# Patient Record
Sex: Male | Born: 1945 | Race: White | Hispanic: No | Marital: Married | Smoking: Former smoker
Health system: Southern US, Community
[De-identification: ages and names within clinical notes are randomized; demographics above are authoritative.]

## PROBLEM LIST (undated history)

## (undated) DIAGNOSIS — K5730 Diverticulosis of large intestine without perforation or abscess without bleeding: Secondary | ICD-10-CM

## (undated) DIAGNOSIS — T7840XA Allergy, unspecified, initial encounter: Secondary | ICD-10-CM

## (undated) DIAGNOSIS — K219 Gastro-esophageal reflux disease without esophagitis: Secondary | ICD-10-CM

## (undated) DIAGNOSIS — R972 Elevated prostate specific antigen [PSA]: Secondary | ICD-10-CM

## (undated) DIAGNOSIS — N400 Enlarged prostate without lower urinary tract symptoms: Secondary | ICD-10-CM

## (undated) DIAGNOSIS — E785 Hyperlipidemia, unspecified: Secondary | ICD-10-CM

## (undated) DIAGNOSIS — W57XXXA Bitten or stung by nonvenomous insect and other nonvenomous arthropods, initial encounter: Secondary | ICD-10-CM

## (undated) DIAGNOSIS — E039 Hypothyroidism, unspecified: Secondary | ICD-10-CM

## (undated) DIAGNOSIS — M1990 Unspecified osteoarthritis, unspecified site: Secondary | ICD-10-CM

## (undated) DIAGNOSIS — I4891 Unspecified atrial fibrillation: Secondary | ICD-10-CM

## (undated) DIAGNOSIS — R339 Retention of urine, unspecified: Secondary | ICD-10-CM

## (undated) DIAGNOSIS — I10 Essential (primary) hypertension: Secondary | ICD-10-CM

## (undated) HISTORY — DX: Essential (primary) hypertension: I10

## (undated) HISTORY — DX: Unspecified atrial fibrillation: I48.91

## (undated) HISTORY — DX: Elevated prostate specific antigen (PSA): R97.20

## (undated) HISTORY — DX: Unspecified osteoarthritis, unspecified site: M19.90

## (undated) HISTORY — PX: VASECTOMY: SHX75

## (undated) HISTORY — DX: Bitten or stung by nonvenomous insect and other nonvenomous arthropods, initial encounter: W57.XXXA

## (undated) HISTORY — DX: Hyperlipidemia, unspecified: E78.5

## (undated) HISTORY — DX: Allergy, unspecified, initial encounter: T78.40XA

---

## 2005-07-23 ENCOUNTER — Ambulatory Visit: Payer: Self-pay | Admitting: Internal Medicine

## 2005-07-31 ENCOUNTER — Ambulatory Visit: Payer: Self-pay | Admitting: Internal Medicine

## 2005-10-01 ENCOUNTER — Ambulatory Visit: Payer: Self-pay | Admitting: Internal Medicine

## 2005-11-07 ENCOUNTER — Ambulatory Visit: Payer: Self-pay | Admitting: Internal Medicine

## 2006-05-10 ENCOUNTER — Ambulatory Visit: Payer: Self-pay | Admitting: Internal Medicine

## 2006-10-08 ENCOUNTER — Ambulatory Visit: Payer: Self-pay | Admitting: Internal Medicine

## 2006-10-08 LAB — CONVERTED CEMR LAB
ALT: 46 units/L — ABNORMAL HIGH (ref 0–40)
AST: 36 units/L (ref 0–37)
Albumin: 3.7 g/dL (ref 3.5–5.2)
Calcium: 8.8 mg/dL (ref 8.4–10.5)
Chloride: 105 meq/L (ref 96–112)
Creatinine, Ser: 1.1 mg/dL (ref 0.4–1.5)
Eosinophils Absolute: 0.2 10*3/uL (ref 0.0–0.6)
Eosinophils Relative: 3.2 % (ref 0.0–5.0)
GFR calc non Af Amer: 72 mL/min
Glucose, Bld: 103 mg/dL — ABNORMAL HIGH (ref 70–99)
HCT: 47.5 % (ref 39.0–52.0)
LDL Cholesterol: 113 mg/dL — ABNORMAL HIGH (ref 0–99)
MCV: 89.5 fL (ref 78.0–100.0)
Neutrophils Relative %: 47.1 % (ref 43.0–77.0)
Platelets: 351 10*3/uL (ref 150–400)
RBC: 5.31 M/uL (ref 4.22–5.81)
RDW: 11.9 % (ref 11.5–14.6)
Sodium: 141 meq/L (ref 135–145)
Total Bilirubin: 0.9 mg/dL (ref 0.3–1.2)
Total CHOL/HDL Ratio: 4.8
Triglycerides: 134 mg/dL (ref 0–149)
VLDL: 27 mg/dL (ref 0–40)
WBC: 6.8 10*3/uL (ref 4.5–10.5)

## 2006-10-15 ENCOUNTER — Ambulatory Visit: Payer: Self-pay | Admitting: Internal Medicine

## 2006-12-19 ENCOUNTER — Ambulatory Visit: Payer: Self-pay | Admitting: Internal Medicine

## 2006-12-19 LAB — CONVERTED CEMR LAB
PSA, Free Pct: 20 — ABNORMAL LOW (ref >25)
PSA, Free: 0.6 ng/mL

## 2006-12-26 ENCOUNTER — Ambulatory Visit: Payer: Self-pay | Admitting: Internal Medicine

## 2007-01-14 ENCOUNTER — Encounter: Payer: Self-pay | Admitting: Internal Medicine

## 2007-01-20 ENCOUNTER — Ambulatory Visit: Payer: Self-pay | Admitting: Gastroenterology

## 2007-01-31 ENCOUNTER — Ambulatory Visit: Payer: Self-pay | Admitting: Gastroenterology

## 2007-01-31 ENCOUNTER — Encounter: Payer: Self-pay | Admitting: Internal Medicine

## 2007-01-31 HISTORY — PX: COLONOSCOPY: SHX174

## 2007-01-31 LAB — HM COLONOSCOPY

## 2007-02-03 DIAGNOSIS — I10 Essential (primary) hypertension: Secondary | ICD-10-CM | POA: Insufficient documentation

## 2007-02-03 DIAGNOSIS — M109 Gout, unspecified: Secondary | ICD-10-CM | POA: Insufficient documentation

## 2007-02-03 DIAGNOSIS — E785 Hyperlipidemia, unspecified: Secondary | ICD-10-CM

## 2007-03-21 ENCOUNTER — Ambulatory Visit: Payer: Self-pay | Admitting: Internal Medicine

## 2007-03-21 DIAGNOSIS — R972 Elevated prostate specific antigen [PSA]: Secondary | ICD-10-CM | POA: Insufficient documentation

## 2007-08-14 HISTORY — PX: OTHER SURGICAL HISTORY: SHX169

## 2007-09-19 ENCOUNTER — Ambulatory Visit: Payer: Self-pay | Admitting: Internal Medicine

## 2007-09-19 LAB — CONVERTED CEMR LAB
Alkaline Phosphatase: 50 units/L (ref 39–117)
BUN: 13 mg/dL (ref 6–23)
Basophils Absolute: 0 10*3/uL (ref 0.0–0.1)
Basophils Relative: 0.2 % (ref 0.0–1.0)
Bilirubin Urine: NEGATIVE
Blood in Urine, dipstick: NEGATIVE
CO2: 28 meq/L (ref 19–32)
Cholesterol: 159 mg/dL (ref 0–200)
Eosinophils Absolute: 0.2 10*3/uL (ref 0.0–0.6)
GFR calc Af Amer: 87 mL/min
GFR calc non Af Amer: 72 mL/min
HDL: 30 mg/dL — ABNORMAL LOW (ref >39.0)
Hemoglobin: 16.6 g/dL (ref 13.0–17.0)
Ketones, urine, test strip: NEGATIVE
Lymphocytes Relative: 32.9 % (ref 12.0–46.0)
MCHC: 34.1 g/dL (ref 30.0–36.0)
MCV: 87.7 fL (ref 78.0–100.0)
Monocytes Absolute: 0.7 10*3/uL (ref 0.2–0.7)
Monocytes Relative: 11.6 % — ABNORMAL HIGH (ref 3.0–11.0)
Neutro Abs: 3.3 10*3/uL (ref 1.4–7.7)
Potassium: 4.3 meq/L (ref 3.5–5.1)
Specific Gravity, Urine: 1.015
TSH: 4.53 microintl units/mL (ref 0.35–5.50)
Total Protein: 6.7 g/dL (ref 6.0–8.3)
Triglycerides: 105 mg/dL (ref 0–149)
pH: 5.5

## 2007-09-24 ENCOUNTER — Ambulatory Visit: Payer: Self-pay | Admitting: Internal Medicine

## 2007-09-24 DIAGNOSIS — K2970 Gastritis, unspecified, without bleeding: Secondary | ICD-10-CM | POA: Insufficient documentation

## 2007-09-24 DIAGNOSIS — K2990 Gastroduodenitis, unspecified, without bleeding: Secondary | ICD-10-CM

## 2007-11-28 ENCOUNTER — Ambulatory Visit: Payer: Self-pay | Admitting: Internal Medicine

## 2008-04-16 ENCOUNTER — Ambulatory Visit: Payer: Self-pay | Admitting: Internal Medicine

## 2008-04-16 LAB — CONVERTED CEMR LAB
ALT: 42 units/L (ref 0–53)
AST: 31 units/L (ref 0–37)
Bilirubin, Direct: 0.1 mg/dL (ref 0.0–0.3)
Cholesterol: 159 mg/dL (ref 0–200)
HDL: 23.9 mg/dL — ABNORMAL LOW (ref >39.0)
Total Bilirubin: 1 mg/dL (ref 0.3–1.2)

## 2008-05-03 ENCOUNTER — Ambulatory Visit: Payer: Self-pay | Admitting: Internal Medicine

## 2008-08-13 HISTORY — PX: OTHER SURGICAL HISTORY: SHX169

## 2008-10-01 ENCOUNTER — Ambulatory Visit: Payer: Self-pay | Admitting: Internal Medicine

## 2008-10-01 LAB — CONVERTED CEMR LAB
Albumin: 4 g/dL (ref 3.5–5.2)
Alkaline Phosphatase: 56 units/L (ref 39–117)
BUN: 16 mg/dL (ref 6–23)
Bilirubin Urine: NEGATIVE
Blood in Urine, dipstick: NEGATIVE
Calcium: 9.3 mg/dL (ref 8.4–10.5)
Eosinophils Relative: 3 % (ref 0.0–5.0)
GFR calc Af Amer: 87 mL/min
Glucose, Bld: 96 mg/dL (ref 70–99)
Glucose, Urine, Semiquant: NEGATIVE
HCT: 48.7 % (ref 39.0–52.0)
Hemoglobin: 16.6 g/dL (ref 13.0–17.0)
Monocytes Absolute: 0.7 10*3/uL (ref 0.1–1.0)
Monocytes Relative: 11.1 % (ref 3.0–12.0)
Neutro Abs: 3 10*3/uL (ref 1.4–7.7)
Nitrite: NEGATIVE
Potassium: 4.1 meq/L (ref 3.5–5.1)
RBC: 5.35 M/uL (ref 4.22–5.81)
Specific Gravity, Urine: 1.025
Total CHOL/HDL Ratio: 5.4
Total Protein: 7.1 g/dL (ref 6.0–8.3)
Urobilinogen, UA: 0.2
WBC Urine, dipstick: NEGATIVE
WBC: 6.1 10*3/uL (ref 4.5–10.5)
pH: 6

## 2008-10-25 ENCOUNTER — Ambulatory Visit: Payer: Self-pay | Admitting: Internal Medicine

## 2008-10-25 DIAGNOSIS — E039 Hypothyroidism, unspecified: Secondary | ICD-10-CM

## 2008-11-19 ENCOUNTER — Telehealth (INDEPENDENT_AMBULATORY_CARE_PROVIDER_SITE_OTHER): Payer: Self-pay | Admitting: *Deleted

## 2008-12-17 ENCOUNTER — Ambulatory Visit: Payer: Self-pay | Admitting: Internal Medicine

## 2008-12-17 LAB — CONVERTED CEMR LAB
Cholesterol: 150 mg/dL (ref 0–200)
LDL Cholesterol: 95 mg/dL (ref 0–99)
TSH: 1.61 microintl units/mL (ref 0.35–5.50)

## 2008-12-24 ENCOUNTER — Ambulatory Visit: Payer: Self-pay | Admitting: Internal Medicine

## 2009-09-28 ENCOUNTER — Ambulatory Visit: Payer: Self-pay | Admitting: Internal Medicine

## 2009-10-17 ENCOUNTER — Telehealth: Payer: Self-pay | Admitting: Internal Medicine

## 2009-10-26 ENCOUNTER — Ambulatory Visit: Payer: Self-pay | Admitting: Internal Medicine

## 2009-10-26 LAB — CONVERTED CEMR LAB
BUN: 8 mg/dL (ref 6–23)
Bilirubin, Direct: 0 mg/dL (ref 0.0–0.3)
Blood in Urine, dipstick: NEGATIVE
CO2: 31 meq/L (ref 19–32)
Chloride: 107 meq/L (ref 96–112)
Cholesterol: 170 mg/dL (ref 0–200)
Creatinine, Ser: 1.1 mg/dL (ref 0.4–1.5)
Eosinophils Absolute: 0.2 10*3/uL (ref 0.0–0.7)
Glucose, Bld: 92 mg/dL (ref 70–99)
Glucose, Urine, Semiquant: NEGATIVE
Ketones, urine, test strip: NEGATIVE
LDL Cholesterol: 89 mg/dL (ref 0–99)
Lymphs Abs: 2.5 10*3/uL (ref 0.7–4.0)
MCHC: 33.5 g/dL (ref 30.0–36.0)
MCV: 90.7 fL (ref 78.0–100.0)
Monocytes Absolute: 0.7 10*3/uL (ref 0.1–1.0)
Neutrophils Relative %: 51.3 % (ref 43.0–77.0)
PSA: 3.69 ng/mL (ref 0.10–4.00)
Platelets: 341 10*3/uL (ref 150.0–400.0)
RDW: 12.5 % (ref 11.5–14.6)
TSH: 3.43 microintl units/mL (ref 0.35–5.50)
Total Bilirubin: 0.5 mg/dL (ref 0.3–1.2)
Triglycerides: 199 mg/dL — ABNORMAL HIGH (ref 0.0–149.0)
Urobilinogen, UA: 0.2
WBC: 7.1 10*3/uL (ref 4.5–10.5)

## 2010-03-06 ENCOUNTER — Ambulatory Visit: Payer: Self-pay | Admitting: Internal Medicine

## 2010-03-06 DIAGNOSIS — D179 Benign lipomatous neoplasm, unspecified: Secondary | ICD-10-CM | POA: Insufficient documentation

## 2010-03-06 DIAGNOSIS — K7689 Other specified diseases of liver: Secondary | ICD-10-CM

## 2010-03-27 ENCOUNTER — Encounter: Payer: Self-pay | Admitting: Internal Medicine

## 2010-04-13 ENCOUNTER — Ambulatory Visit (HOSPITAL_BASED_OUTPATIENT_CLINIC_OR_DEPARTMENT_OTHER): Admission: RE | Admit: 2010-04-13 | Discharge: 2010-04-13 | Payer: Self-pay | Admitting: General Surgery

## 2010-05-02 ENCOUNTER — Encounter: Payer: Self-pay | Admitting: Internal Medicine

## 2010-06-26 ENCOUNTER — Ambulatory Visit: Payer: Self-pay | Admitting: Internal Medicine

## 2010-06-26 LAB — CONVERTED CEMR LAB
AST: 42 units/L — ABNORMAL HIGH (ref 0–37)
Alkaline Phosphatase: 58 units/L (ref 39–117)
Iron: 106 ug/dL (ref 42–165)
Total Bilirubin: 0.8 mg/dL (ref 0.3–1.2)

## 2010-07-03 ENCOUNTER — Ambulatory Visit: Payer: Self-pay | Admitting: Internal Medicine

## 2010-07-03 LAB — CONVERTED CEMR LAB
HDL goal, serum: 40 mg/dL
LDL Goal: 130 mg/dL

## 2010-09-10 LAB — CONVERTED CEMR LAB: H Pylori IgG: NEGATIVE

## 2010-09-13 NOTE — Letter (Signed)
Summary: [facility]   Imported By: Maryln Gottron 05/10/2010 11:00:03  _____________________________________________________________________  External Attachment:    Type:   Image     Comment:   External Document

## 2010-09-13 NOTE — Letter (Signed)
Summary: [facility]   Imported By: Maryln Gottron 04/06/2010 14:20:01  _____________________________________________________________________  External Attachment:    Type:   Image     Comment:   External Document

## 2010-09-13 NOTE — Progress Notes (Signed)
Summary: Pt req script for Lotrel 10-40mg  caps 90day supply 3 refills  Phone Note Call from Patient Call back at Home Phone [phone]   Caller: spouse-Sylvia Complaint: Urinary/GYN Problems Summary of Call: Pt needs a new script for Lotrel Caps 10-40mg  90 day supply with 3 refills to [facility].    Initial call taken by: Lucy Antigua,  October 17, 2009 11:55 AM    Prescriptions: LOTREL 10-40 MG  CAPS (AMLODIPINE BESY-BENAZEPRIL HCL) once daily  #90 x 3   Entered by:   Willy Eddy, LPN   Authorized by:   Stacie Glaze MD   Signed by:   Willy Eddy, LPN on 09/81/1914   Method used:   Electronically to        [facility]* (mail-order)             ,          Ph: [phone]       Fax: [phone]   RxID:   [id number]   Appended Document: Pt req script for Lotrel 10-40mg  caps 90day supply 3 refills sent

## 2010-09-13 NOTE — Assessment & Plan Note (Signed)
Summary: 3 month rov/njr   Vital Signs:  Patient profile:   65 year old male Height:      71 inches Weight:      215 pounds BMI:     30.09 Temp:     98.2 degrees F oral Pulse rate:   72 / minute Resp:     14 per minute BP sitting:   138 / 80  (left arm)  Vitals Entered By: Willy Eddy, LPN (July 03, 2010 9:02 AM) CC: roa labs, Lipid Management Is Patient Diabetic? No   Primary Care Provider:  Stacie Glaze MD  CC:  roa labs and Lipid Management.  History of Present Illness: Followed for elevations of liver enzymes minimize exposure to ETOH reviewed meds  he is on a statin the iron and copper test were moderate elevations of liver functons  increase rash of legs previously felt to be ring worm?  Lipid Management History:      Positive NCEP/ATP III risk factors include male age 5 years old or older and hypertension.  Negative NCEP/ATP III risk factors include no family history for ischemic heart disease and non-tobacco-user status.     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 Take 1 Once Daily 2)  Cialis 20 Mg Tabs (Tadalafil) .... Take 1 Tablet By Mouth As Directed 3)  Vytorin 10-20 Mg Tabs (Ezetimibe-Simvastatin) .... Take 1/2 Tablet By Mouth Once A Day 4)  Zyrtec Allergy 10 Mg  Tabs (Cetirizine Hcl) .... Once Daily  ( Otc) 5)  Lotrel 10-40 Mg  Caps (Amlodipine Besy-Benazepril Hcl) .... Once  Daily 6)  Prevacid 15 Mg Cpdr (Lansoprazole) .Marland Kitchen.. 1 Once Daily As Needed Indigestion 7)  Levothyroxine Sodium 25 Mcg Tabs (Levothyroxine Sodium) .... One By Mouth Daily 8)  Colchicine 0.6 Mg Tabs (Colchicine) .Marland Kitchen.. 1 Two Times A Day As Needed  Allergies (verified): No Known Drug Allergies  Past History:  Family History: Last updated: 03/21/2007 father diesd from ETOH and had varoceole bleed mother died with dementia  Social History: Last updated: 03/21/2007 Divorced Former Smoker quit greater that 35 years  Risk Factors: Diet: goof diet (03/06/2010) Exercise: yes (03/06/2010)  Risk Factors: Smoking Status: quit (07/03/2010) Passive Smoke Exposure: no (07/03/2010)  Past medical, surgical, family and social histories (including risk factors) reviewed, and no changes noted (except as noted below).  Past Medical History: Reviewed history from 02/03/2007 and no changes required. Hyperlipidemia Hypertension elevated psa Gout  Past Surgical History: Reviewed history from 03/21/2007 and no changes required. Vasectomy  Family History: Reviewed history from 03/21/2007 and no changes required. father diesd from ETOH and had varoceole bleed mother died with dementia  Social History: Reviewed history from 03/21/2007 and no changes required. Divorced Former Smoker quit greater that 35 years  Review of Systems  The patient denies anorexia, fever, weight loss, weight gain, vision loss, decreased hearing, hoarseness, chest pain, syncope, dyspnea on exertion, peripheral  edema, prolonged cough, headaches, hemoptysis, abdominal pain, melena, hematochezia, severe indigestion/heartburn, hematuria, incontinence, genital sores, muscle weakness, suspicious skin lesions, transient blindness, difficulty walking, depression, unusual weight change, abnormal bleeding, enlarged lymph nodes, angioedema, and breast masses.         Marland [email or web address]   Physical Exam  General:  alert and  well-hydrated.   Head:  normocephalic and atraumatic.   Eyes:  pupils equal and pupils round.   Nose:  no external deformity and no nasal discharge.   Neck:  No deformities, masses, or tenderness noted. Lungs:  normal respiratory effort and no wheezes.   Heart:  normal rate and regular rhythm.     Impression & Recommendations:  Problem # 1:  FATTY LIVER DISEASE (ICD-571.8)  Problem # 2:  HYPERLIPIDEMIA (ICD-272.4)  His updated medication list for this problem includes:    Zetia 10 Mg Tabs (Ezetimibe) ..... One by mouth daily  Labs Reviewed: SGOT: 42 (06/26/2010)   SGPT: 71 (06/26/2010)  Lipid Goals: Chol Goal: 200 (07/03/2010)   HDL Goal: 40 (07/03/2010)   LDL Goal: 130 (07/03/2010)   TG Goal: 150 (07/03/2010)  10 Yr Risk Heart Disease: 11 % Prior 10 Yr Risk Heart Disease: 27 % (09/24/2007)   HDL:40.80 (10/26/2009), 32.90 (12/17/2008)  LDL:89 (10/26/2009), 95 (12/17/2008)  Chol:170 (10/26/2009), 150 (12/17/2008)  Trig:199.0 (10/26/2009), 109.0 (12/17/2008)  Problem # 3:  HYPERTENSION (ICD-401.9)  His updated medication list for this problem includes:    Lotrel 10-40 Mg Caps (Amlodipine besy-benazepril hcl) ..... Once daily  BP today: 138/80 Prior BP: 134/76 (03/06/2010)  10 Yr Risk Heart Disease: 11 % Prior 10 Yr Risk Heart Disease: 27 % (09/24/2007)  Labs Reviewed: K+: 3.9 (10/26/2009) Creat: : 1.1 (10/26/2009)   Chol: 170 (10/26/2009)   HDL: 40.80 (10/26/2009)   LDL: 89 (10/26/2009)   TG: 199.0 (10/26/2009)  Problem # 4:  FATTY LIVER DISEASE (ICD-571.8)  Problem # 5:  RINGWORM (ICD-110.9) lotrisome to rash two times a day and if the  Complete Medication List: 1)  Aspirin 325 Mg Tbec (Aspirin) .... Take 1 once daily 2)  Cialis 20 Mg Tabs (Tadalafil) .... Take 1 tablet by mouth as directed 3)  Zetia 10 Mg Tabs (Ezetimibe) .... One by mouth daily 4)  Zyrtec Allergy 10 Mg Tabs (Cetirizine hcl) .... Once daily  ( otc) 5)  Lotrel 10-40 Mg Caps (Amlodipine  besy-benazepril hcl) .... Once daily 6)  Prevacid 15 Mg Cpdr (Lansoprazole) .Marland Kitchen.. 1 once daily as needed indigestion 7)  Levothyroxine Sodium 25 Mcg Tabs (Levothyroxine sodium) .... One by mouth daily 8)  Colchicine 0.6 Mg Tabs (Colchicine) .Marland Kitchen.. 1 two times a day as needed 9)  Clotrimazole-betamethasone 1-0.05 % Crea (Clotrimazole-betamethasone) .... Apply to rash two times a day  Lipid Assessment/Plan:      Based on NCEP/ATP III, the patient's risk factor category is "2 or more risk factors and a calculated 10 year CAD risk of < 20%".  The patient's lipid goals are as follows: Total cholesterol goal is 200; LDL cholesterol goal is 130; HDL cholesterol goal is 40; Triglyceride goal is 150.    Patient Instructions: 1)  Please schedule a follow-up appointment in 4  months. 2)  Hepatic Panel prior to visit, ICD-9: 995.20 3)  Lipid Panel prior to visit, ICD-9:272.4 4)  if the lotions does not work will refer to dermatologist Prescriptions: CLOTRIMAZOLE-BETAMETHASONE 1-0.05 % CREA (CLOTRIMAZOLE-BETAMETHASONE) apply to rash two times a day  #60 cc x 3   Entered and Authorized by:  Stacie Glaze MD   Signed by:   Stacie Glaze MD on 07/03/2010   Method used:   Electronically to        [facility] [phone]* (retail)       [address]       [place], [place]  [address]       Ph: [phone]       Fax: [phone]   RxID:   [id number] ZETIA 10 MG TABS (EZETIMIBE) one by mouth daily  #90 x 3   Entered and Authorized by:   Stacie Glaze MD   Signed by:   Stacie Glaze MD on 07/03/2010   Method used:   Faxed to ...       [facility] (mail-order)             , [place]         Ph: [phone]       Fax: [phone]   RxID:   [id number]    Orders Added: 1)  Est. Patient Level IV [18841]

## 2010-09-13 NOTE — Assessment & Plan Note (Signed)
Summary: cpx-3rd reschedule/bmw   Vital Signs:  Patient profile:   65 year old male Height:      71 inches Weight:      212 pounds BMI:     29.67 Temp:     98.2 degrees F oral Pulse rate:   72 / minute Resp:     14 per minute BP sitting:   134 / 76  (left arm)  Vitals Entered By: Willy Eddy, LPN (March 06, 2010 12:29 PM) CC: annual visit for disease management Is Patient Diabetic? No  Vision Screening:Left eye with correction: 20 / 20 Right eye with correction: 20 / 20 Both eyes with correction: 20 / 20  Color vision testing: normal     40db HL: Left  Right  Audiometry Comment: hears whispered voice at 6 feet    CC:  annual visit for disease management.  History of Present Illness: welcome to medicare exam  Preventive Screening-Counseling & Management  Alcohol-Tobacco     Smoking Status: quit     Year Quit: 1973     Passive Smoke Exposure: no     Tobacco Counseling: to remain off tobacco products  Caffeine-Diet-Exercise     Diet Comments: goof diet     Does Patient Exercise: yes     Type of exercise: walks     Exercise (avg: min/session): 30-60     Times/week: 3     Exercise Counseling: not indicated; exercise is adequate     Depression Counseling: not indicated; screening negative for depression  Safety-Violence-Falls     Seat Belt Use: yes     Smoke Detectors: yes     Violence in the Home: no risk noted     Fall Risk: none     Fall Risk Counseling: not indicated; no significant falls noted  Problems Prior to Update: 1)  Lipoma  (ICD-214.9) 2)  Fatty Liver Disease  (ICD-571.8) 3)  Unspecified Hypothyroidism  (ICD-244.9) 4)  Gastritis  (ICD-535.50) 5)  Physical Examination  (ICD-V70.0) 6)  Prostate Specific Antigen, Elevated  (ICD-790.93) 7)  Gout  (ICD-274.9) 8)  Hypertension  (ICD-401.9) 9)  Hyperlipidemia  (ICD-272.4)  Current Problems (verified): 1)  Unspecified Hypothyroidism  (ICD-244.9) 2)  Gastritis  (ICD-535.50) 3)  Physical  Examination  (ICD-V70.0) 4)  Prostate Specific Antigen, Elevated  (ICD-790.93) 5)  Gout  (ICD-274.9) 6)  Hypertension  (ICD-401.9) 7)  Hyperlipidemia  (ICD-272.4)  Medications Prior to Update: 1)  Aspirin 325 Mg Tbec (Aspirin) .... Take 1 Once Daily 2)  Cialis 20 Mg Tabs (Tadalafil) .... Take 1 Tablet By Mouth As Directed 3)  Rhinocort Aqua 32 Mcg/act Susp (Budesonide (Nasal)) .... As Needed 4)  Vytorin 10-20 Mg Tabs (Ezetimibe-Simvastatin) .... Take 1/2 Tablet By Mouth Once A Day 5)  Zyrtec Allergy 10 Mg  Tabs (Cetirizine Hcl) .... Once Daily  ( Otc) 6)  Lotrel 10-40 Mg  Caps (Amlodipine Besy-Benazepril Hcl) .... Once Daily 7)  Nexium 40 Mg  Cpdr (Esomeprazole Magnesium) .... One By Mouth Daily 8)  Niacin Cr 250 Mg Cr-Tabs (Niacin) .... One By Mouth Daily 9)  Levothyroxine Sodium 25 Mcg Tabs (Levothyroxine Sodium) .... One By Mouth Daily 10)  Indomethacin 25 Mg Caps (Indomethacin) .Marland Kitchen.. 1 Two Times A Day 11)  Colchicine 0.6 Mg Tabs (Colchicine) .Marland Kitchen.. 1 Two Times A Day  Current Medications (verified): 1)  Aspirin 325 Mg Tbec (Aspirin) .... Take 1 Once Daily 2)  Cialis 20 Mg Tabs (Tadalafil) .... Take 1 Tablet By Mouth As Directed  3)  Vytorin 10-20 Mg Tabs (Ezetimibe-Simvastatin) .... Take 1/2 Tablet By Mouth Once A Day 4)  Zyrtec Allergy 10 Mg  Tabs (Cetirizine Hcl) .... Once Daily  ( Otc) 5)  Lotrel 10-40 Mg  Caps (Amlodipine Besy-Benazepril Hcl) .... Once Daily 6)  Prevacid 15 Mg Cpdr (Lansoprazole) .Marland Kitchen.. 1 Once Daily As Needed Indigestion 7)  Levothyroxine Sodium 25 Mcg Tabs (Levothyroxine Sodium) .... One By Mouth Daily 8)  Colchicine 0.6 Mg Tabs (Colchicine) .Marland Kitchen.. 1 Two Times A Day As Needed  Allergies (verified): No Known Drug Allergies  Past History:  Family History: Last updated: 03/21/2007 father diesd from ETOH and had varoceole bleed mother died with dementia  Social History: Last updated: 03/21/2007 Divorced Former Smoker quit greater that 35 years  Risk  Factors: Diet: goof diet (03/06/2010) Exercise: yes (03/06/2010)  Risk Factors: Smoking Status: quit (03/06/2010) Passive Smoke Exposure: no (03/06/2010)  Past medical, surgical, family and social histories (including risk factors) reviewed, and no changes noted (except as noted below).  Past Medical History: Reviewed history from 02/03/2007 and no changes required. Hyperlipidemia Hypertension elevated psa Gout  Past Surgical History: Reviewed history from 03/21/2007 and no changes required. Vasectomy  Family History: Reviewed history from 03/21/2007 and no changes required. father diesd from ETOH and had varoceole bleed mother died with dementia  Social History: Reviewed history from 03/21/2007 and no changes required. 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    Physical Exam  General:  alert and well-hydrated.   Head:  normocephalic and atraumatic.   Eyes:  pupils equal and pupils round.   Ears:  R ear normal and L ear normal.   Nose:  no external deformity and no nasal discharge.   Mouth:  good dentition and pharynx pink and moist.   Neck:  No deformities, masses, or tenderness noted. Chest Wall:  no masses.   Breasts:  no masses and no adenopathy.   Lungs:  normal respiratory effort and no wheezes.   Heart:  normal rate and regular rhythm.   Abdomen:  soft and normal bowel sounds.    Rectal:  normal sphincter tone and external hemorrhoid(s).   Genitalia:  circumcised and no urethral discharge.   Prostate:  no asymmetry and 1+ enlarged.   Msk:  normal ROM and no joint tenderness.   Pulses:  R and L carotid,radial,femoral,dorsalis pedis and posterior tibial pulses are full and equal bilaterally Extremities:  trace left pedal edema and trace right pedal edema.   Neurologic:  alert & oriented X3 and finger-to-nose normal.   Skin:  lipoma on neck Cervical Nodes:  No lymphadenopathy noted Axillary Nodes:  No palpable lymphadenopathy Psych:  Oriented X3, good eye contact, and not anxious appearing.     Impression & Recommendations:  Problem # 1:  PHYSICAL EXAMINATION (ICD-V70.0) The pt was asked about all immunizations, health maint. services that are appropriate to their age and was given guidance on diet exercize  and weight management welcome to medicare exam reviewed referrals and HM issues as well as immunizatins and appropriate screenings pt has  labs ad these were reviewed with him  Colonoscopy: Results: Hemorrhoids.     Results: Diverticulosis.        (01/31/2007) Td Booster: Tdap (09/24/2007)   Flu Vax: Fluvax 3+ (09/28/2009)   Pneumovax: Pneumovax (10/25/2008) Chol: 170 (10/26/2009)   HDL: 40.80 (10/26/2009)   LDL: 89 (10/26/2009)   TG: 199.0 (10/26/2009) TSH: 3.43 (10/26/2009)   PSA: 3.69 (10/26/2009) Next Colonoscopy due:: 03/2012 (03/21/2007)  Discussed using sunscreen, use of alcohol, drug use, self testicular exam, routine dental care, routine eye care, routine physical exam, seat belts, multiple vitamins, osteoporosis prevention, adequate calcium intake in diet, and recommendations for immunizations.  Discussed exercise and checking cholesterol.  Discussed gun safety, safe sex, and contraception. Also recommend checking PSA.  Problem # 2:  PROSTATE SPECIFIC ANTIGEN, ELEVATED (ICD-790.93)  Problem # 3:  UNSPECIFIED HYPOTHYROIDISM (ICD-244.9)  His  updated medication list for this problem includes:    Levothyroxine Sodium 25 Mcg Tabs (Levothyroxine sodium) ..... One by mouth daily  Labs Reviewed: TSH: 3.43 (10/26/2009)    Chol: 170 (10/26/2009)   HDL: 40.80 (10/26/2009)   LDL: 89 (10/26/2009)   TG: 199.0 (10/26/2009)  Problem # 4:  HYPERTENSION (ICD-401.9)  His updated medication list for this problem includes:    Lotrel 10-40 Mg Caps (Amlodipine besy-benazepril hcl) ..... Once daily  BP today: 134/76 Prior BP: 140/74 (12/24/2008)  Prior 10 Yr Risk Heart Disease: 27 % (09/24/2007)  Labs Reviewed: K+: 3.9 (10/26/2009) Creat: : 1.1 (10/26/2009)   Chol: 170 (10/26/2009)   HDL: 40.80 (10/26/2009)   LDL: 89 (10/26/2009)   TG: 199.0 (10/26/2009)  Problem # 5:  FATTY LIVER DISEASE (ICD-571.8) weight los s goal of 190  Problem # 6:  LIPOMA (ICD-214.9)  referral for removal on neck and irritates pt  Orders: Surgical Referral (Surgery)  Complete Medication List: 1)  Aspirin 325 Mg Tbec (Aspirin) .... Take 1 once daily 2)  Cialis 20 Mg Tabs (Tadalafil) .... Take 1 tablet by mouth as directed 3)  Vytorin 10-20 Mg Tabs (Ezetimibe-simvastatin) .... Take 1/2 tablet by mouth once a day 4)  Zyrtec Allergy 10 Mg Tabs (Cetirizine hcl) .... Once daily  ( otc) 5)  Lotrel 10-40 Mg Caps (Amlodipine besy-benazepril hcl) .... Once daily 6)  Prevacid 15 Mg Cpdr (Lansoprazole) .Marland Kitchen.. 1 once daily as needed indigestion 7)  Levothyroxine Sodium 25 Mcg Tabs (Levothyroxine sodium) .... One by mouth daily 8)  Colchicine 0.6 Mg Tabs (Colchicine) .Marland Kitchen.. 1 two times a day as needed  Patient Instructions: 1)  Please schedule a follow-up appointment in 4 months. 2)  Hepatic Panel prior to visit, ICD-9:995.20 3)  iron and copper levels  571.8  Prevention & Chronic Care Immunizations   Influenza vaccine: Fluvax 3+  (09/28/2009)   Influenza vaccine due: 04/13/2010    Tetanus booster: 09/24/2007: Tdap   Tetanus booster due: 09/23/2017    Pneumococcal  vaccine: Pneumovax  (10/25/2008)   Pneumococcal vaccine due: 10/25/2013    H. zoster vaccine: Not documented  Colorectal Screening   Hemoccult: Not documented    Colonoscopy: Results: Hemorrhoids.     Results: Diverticulosis.         (01/31/2007)   Colonoscopy action/deferral: Repeat colonoscopy in 10 years.    (01/31/2007)   Colonoscopy due: 03/2012  Other Screening   PSA: 3.69  (10/26/2009)   Smoking status: quit  (03/06/2010)  Lipids   Total Cholesterol: 170  (10/26/2009)   LDL: 89  (10/26/2009)   LDL Direct: Not documented   HDL: 40.80  (  10/26/2009)   Triglycerides: 199.0  (10/26/2009)   Lipid panel due: 09/06/2010    SGOT (AST): 46  (10/26/2009)   SGPT (ALT): 79  (10/26/2009)   Alkaline phosphatase: 56  (10/26/2009)   Total bilirubin: 0.5  (10/26/2009)   Liver panel due: 09/06/2010    Lipid flowsheet reviewed?: Yes   Progress toward LDL goal: At goal  Hypertension   Last Blood Pressure: 134 / 76  (03/06/2010)   Serum creatinine: 1.1  (10/26/2009)   Serum potassium 3.9  (10/26/2009)    Hypertension flowsheet reviewed?: Yes   Progress toward BP goal: At goal    Stage of readiness to change (hypertension management): Maintenance  Self-Management Support :    Patient will work on the following items until the next clinic visit to reach self-care goals:     Medications and monitoring: take my medicines every day  (03/06/2010)     Eating: eat more vegetables, eat baked foods instead of fried foods, eat fruit for snacks and desserts, limit or avoid alcohol  (03/06/2010)     Activity: take a 30 minute walk every day  (03/06/2010)    Hypertension self-management support: BP self-monitoring log  (03/06/2010)    Lipid self-management support: Education handout  (03/06/2010)     Lipid education handout printed

## 2010-10-23 ENCOUNTER — Other Ambulatory Visit: Payer: Self-pay | Admitting: *Deleted

## 2010-10-23 ENCOUNTER — Telehealth: Payer: Self-pay | Admitting: Internal Medicine

## 2010-10-23 ENCOUNTER — Other Ambulatory Visit (INDEPENDENT_AMBULATORY_CARE_PROVIDER_SITE_OTHER): Payer: Medicare Other | Admitting: Internal Medicine

## 2010-10-23 DIAGNOSIS — E785 Hyperlipidemia, unspecified: Secondary | ICD-10-CM

## 2010-10-23 DIAGNOSIS — T887XXA Unspecified adverse effect of drug or medicament, initial encounter: Secondary | ICD-10-CM

## 2010-10-23 DIAGNOSIS — I10 Essential (primary) hypertension: Secondary | ICD-10-CM

## 2010-10-23 LAB — HEPATIC FUNCTION PANEL
ALT: 65 U/L — ABNORMAL HIGH (ref 0–53)
AST: 36 U/L (ref 0–37)
Bilirubin, Direct: 0.2 mg/dL (ref 0.0–0.3)
Total Bilirubin: 1.1 mg/dL (ref 0.3–1.2)

## 2010-10-23 LAB — LIPID PANEL
Cholesterol: 253 mg/dL — ABNORMAL HIGH (ref 0–200)
Total CHOL/HDL Ratio: 7

## 2010-10-23 MED ORDER — AMLODIPINE BESY-BENAZEPRIL HCL 10-40 MG PO CAPS: 1.0000 | ORAL_CAPSULE | Freq: Every day | ORAL | Status: DC

## 2010-10-23 NOTE — Telephone Encounter (Signed)
Done

## 2010-10-23 NOTE — Telephone Encounter (Signed)
Refill Amlodipine to [facility].

## 2010-10-26 ENCOUNTER — Encounter: Payer: Self-pay | Admitting: Internal Medicine

## 2010-10-27 LAB — CBC
Hemoglobin: 17.5 g/dL — ABNORMAL HIGH (ref 13.0–17.0)
RBC: 5.69 MIL/uL (ref 4.22–5.81)

## 2010-10-27 LAB — DIFFERENTIAL
Basophils Relative: 1 % (ref 0–1)
Lymphs Abs: 2.3 10*3/uL (ref 0.7–4.0)
Monocytes Relative: 15 % — ABNORMAL HIGH (ref 3–12)
Neutro Abs: 3.1 10*3/uL (ref 1.7–7.7)
Neutrophils Relative %: 47 % (ref 43–77)

## 2010-10-27 LAB — BASIC METABOLIC PANEL
Calcium: 8.8 mg/dL (ref 8.4–10.5)
GFR calc Af Amer: >60 mL/min (ref >60)
GFR calc non Af Amer: >60 mL/min (ref >60)
Sodium: 136 mEq/L (ref 135–145)

## 2010-10-30 ENCOUNTER — Ambulatory Visit (INDEPENDENT_AMBULATORY_CARE_PROVIDER_SITE_OTHER): Payer: Medicare Other | Admitting: Internal Medicine

## 2010-10-30 ENCOUNTER — Encounter: Payer: Self-pay | Admitting: Internal Medicine

## 2010-10-30 DIAGNOSIS — M109 Gout, unspecified: Secondary | ICD-10-CM

## 2010-10-30 DIAGNOSIS — K7689 Other specified diseases of liver: Secondary | ICD-10-CM

## 2010-10-30 DIAGNOSIS — E785 Hyperlipidemia, unspecified: Secondary | ICD-10-CM

## 2010-10-30 DIAGNOSIS — I10 Essential (primary) hypertension: Secondary | ICD-10-CM

## 2010-10-30 NOTE — Patient Instructions (Signed)
You'll be taking the Vytorin Monday Wednesday and Friday nights

## 2010-10-30 NOTE — Progress Notes (Signed)
  Subjective:    Patient ID: Stephen Morrison, male    DOB: 1946-05-05, 65 y.o.   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   his liver functions are only slightly elevated from the statin probably DVT more to the fatty infiltration of his liver we discussed resuming a statin on a episodic basis such as Monday Wednesday Friday to minimize the impact of the liver but to help him to achieve his   Review of Systems  Constitutional: Negative for fever and fatigue.  HENT: Negative for hearing loss, congestion, neck pain and postnasal drip.   Eyes: Negative for discharge, redness and visual disturbance.  Respiratory: Negative for cough, shortness of breath and wheezing.   Cardiovascular: Negative for leg swelling.  Gastrointestinal: Negative for abdominal pain, constipation and abdominal distention.  Genitourinary: Negative for urgency and frequency.  Musculoskeletal: Negative for joint swelling and arthralgias.  Skin: Negative for color change and rash.  Neurological: Negative for weakness and light-headedness.  Hematological: Negative for adenopathy.  Psychiatric/Behavioral: Negative for behavioral problems.    lipid lowering goals to do to his other familial and age-related risk factors.    Past Medical History  Diagnosis Date  . Hyperlipidemia   . Hypertension   . PSA elevation   . Gout    Past Surgical History  Procedure Date  . Vasectomy     reports that he quit smoking about 37 years ago. He does not have any smokeless tobacco history on  file. He reports that he drinks alcohol. He reports that he does not use illicit drugs. family history includes Alcohol abuse in his father and Dementia in his father. Allergies not on file  Objective:   Physical Exam  Nursing note and vitals reviewed. Constitutional: He is oriented to person, place, and time. He appears well-developed and well-nourished.  HENT:  Head: Normocephalic and atraumatic.  Eyes: Conjunctivae are normal. Pupils are equal, round, and reactive to light.  Neck: Normal range of motion. Neck supple.  Cardiovascular: Normal rate and regular rhythm.   Pulmonary/Chest: Effort normal and breath sounds normal.  Abdominal: Soft. Bowel sounds are normal.  Musculoskeletal: Normal range of motion.  Neurological: He is alert and oriented to person, place, and time.  Skin: Skin is warm and dry.          Assessment & Plan:   the patient has familial risks for heart disease and his cholesterol is not at goal with current therapy we spent over 30 minutes face-to-face counseling the patient about diet and exercise as well as the impact of statin on his liver and decided together that he should take the statin on a pulse basis Monday Wednesday Friday to minimize the impact of the liver but to help him to achieve his goal for his cholesterol both total HDL and LDL as well as triglycerides.   The patient agreed to this trial samples were given to the patient he will return in 3 month's time with a lipid liver prior to 65

## 2010-12-25 ENCOUNTER — Telehealth: Payer: Self-pay | Admitting: *Deleted

## 2010-12-25 NOTE — Telephone Encounter (Signed)
Pt will come at 12 pm tomorrow.

## 2010-12-25 NOTE — Telephone Encounter (Signed)
Wants to see Dr. Lovell Sheehan today or tomorrow for pulled muscle in his back.

## 2010-12-25 NOTE — Telephone Encounter (Signed)
PLEASE ADVISE.

## 2010-12-25 NOTE — Telephone Encounter (Signed)
Tell him that he may come tomorrow at 12 PM but he'll have to wait If you would like to see my partners we can probably get him in sooner

## 2010-12-26 ENCOUNTER — Ambulatory Visit (INDEPENDENT_AMBULATORY_CARE_PROVIDER_SITE_OTHER): Payer: Medicare Other | Admitting: Internal Medicine

## 2010-12-26 VITALS — BP 150/90 | HR 76 | Temp 98.2°F | Resp 16 | Ht 72.0 in | Wt 216.0 lb

## 2010-12-26 DIAGNOSIS — M533 Sacrococcygeal disorders, not elsewhere classified: Secondary | ICD-10-CM

## 2010-12-26 MED ORDER — PREDNISONE (PAK) 10 MG PO TABS: 10.0000 mg | ORAL_TABLET | ORAL | Status: AC

## 2010-12-26 MED ORDER — DICLOFENAC SODIUM 75 MG PO TBEC: 75.0000 mg | DELAYED_RELEASE_TABLET | Freq: Two times a day (BID) | ORAL | Status: AC

## 2010-12-26 MED ORDER — CHLORZOXAZONE 250 MG PO TABS: 500.0000 mg | ORAL_TABLET | Freq: Two times a day (BID) | ORAL | Status: AC | PRN

## 2010-12-26 MED ORDER — DICLOFENAC SODIUM 75 MG PO TBEC: 75.0000 mg | DELAYED_RELEASE_TABLET | Freq: Two times a day (BID) | ORAL | Status: DC

## 2010-12-26 NOTE — Progress Notes (Signed)
  Subjective:    Patient ID: Stephen Morrison, male    DOB: 1945-12-23, 65 y.o.   MRN: [id number]  HPI He says approximately 15 year history of low back pain has been episodic he was seen at the beginning of diagnosis by [facility] orthopedics he believes an MRI may have been ordered and that it might have shown a ruptured disc.  Since then his flares of pain have been episodic the last flare was evaluated by urgent care on [address] and treated again as a disc rupture.  Pain is lower today. The primary complaint is a catch up on sitting to standing. He has stiffness with rotation but no pain. At the T. flare 2 weeks ago he had more severe pain. His only intervention has been ibuprofen   Review of Systems  Constitutional: Negative for fever and fatigue.  HENT: Negative for hearing loss, congestion, neck pain and postnasal drip.   Eyes: Negative for discharge, redness and visual disturbance.  Respiratory: Negative for cough, shortness of breath and wheezing.   Cardiovascular: Negative for leg swelling.  Gastrointestinal: Negative for abdominal pain, constipation and abdominal distention.  Genitourinary: Negative for urgency and frequency.  Musculoskeletal: Negative for joint swelling and arthralgias.  Skin: Negative for color change and rash.  Neurological: Negative for weakness and light-headedness.  Hematological: Negative for adenopathy.  Psychiatric/Behavioral: Negative for behavioral problems.   Past Medical History  Diagnosis Date  . Hyperlipidemia   . Hypertension   . PSA elevation   . Gout    Past Surgical History  Procedure Date  . Vasectomy     reports that he quit smoking about 37 years ago. He does not have any smokeless tobacco history on file. He reports that he drinks alcohol. He reports that he does not use illicit drugs. family history includes Alcohol abuse in his father and Dementia in his father. Not on File     Objective:   Physical Exam  Constitutional: He  appears well-developed and well-nourished.  HENT:  Head: Normocephalic and atraumatic.  Eyes: Conjunctivae are normal. Pupils are equal, round, and reactive to light.  Neck: Normal range of motion. Neck supple.  Cardiovascular: Normal rate and regular rhythm.   Pulmonary/Chest: Effort normal and breath sounds normal.  Abdominal: Soft. Bowel sounds are normal.  Musculoskeletal:       SI joint pain leftt leg lift  Skin: Skin is warm and dry.          Assessment & Plan:  For the radicular back pain we will use triple therapy diclofenac 75 twice a day Parafon Forte today 500 twice a day and a 6 day prednisone burst and taper

## 2011-01-22 ENCOUNTER — Other Ambulatory Visit: Payer: Self-pay | Admitting: Internal Medicine

## 2011-01-22 ENCOUNTER — Other Ambulatory Visit (INDEPENDENT_AMBULATORY_CARE_PROVIDER_SITE_OTHER): Payer: Medicare Other

## 2011-01-22 DIAGNOSIS — K7689 Other specified diseases of liver: Secondary | ICD-10-CM

## 2011-01-22 DIAGNOSIS — E785 Hyperlipidemia, unspecified: Secondary | ICD-10-CM

## 2011-01-22 LAB — HEPATIC FUNCTION PANEL
ALT: 80 U/L — ABNORMAL HIGH (ref 0–53)
Alkaline Phosphatase: 70 U/L (ref 39–117)
Bilirubin, Direct: 0.1 mg/dL (ref 0.0–0.3)
Total Bilirubin: 0.6 mg/dL (ref 0.3–1.2)
Total Protein: 7.4 g/dL (ref 6.0–8.3)

## 2011-01-22 LAB — LIPID PANEL
Total CHOL/HDL Ratio: 5
Triglycerides: 177 mg/dL — ABNORMAL HIGH (ref 0.0–149.0)

## 2011-01-22 LAB — LDL CHOLESTEROL, DIRECT: Direct LDL: 139.7 mg/dL

## 2011-01-29 ENCOUNTER — Ambulatory Visit (INDEPENDENT_AMBULATORY_CARE_PROVIDER_SITE_OTHER): Payer: Medicare Other | Admitting: Internal Medicine

## 2011-01-29 ENCOUNTER — Encounter: Payer: Self-pay | Admitting: Internal Medicine

## 2011-01-29 VITALS — BP 130/80 | HR 72 | Temp 98.2°F | Resp 16 | Ht 71.0 in | Wt 216.0 lb

## 2011-01-29 DIAGNOSIS — I10 Essential (primary) hypertension: Secondary | ICD-10-CM

## 2011-01-29 DIAGNOSIS — E785 Hyperlipidemia, unspecified: Secondary | ICD-10-CM

## 2011-01-29 DIAGNOSIS — K7689 Other specified diseases of liver: Secondary | ICD-10-CM

## 2011-01-29 MED ORDER — EZETIMIBE-SIMVASTATIN 10-40 MG PO TABS: 1.0000 | ORAL_TABLET | ORAL | Status: DC

## 2011-01-29 NOTE — Progress Notes (Signed)
  Subjective:    Patient ID: Stephen Morrison, male    DOB: Jul 22, 1946, 65 y.o.   MRN: [id number]  HPI    Review of Systems     Objective:   Physical Exam        Assessment & Plan:

## 2011-03-25 ENCOUNTER — Other Ambulatory Visit: Payer: Self-pay | Admitting: Internal Medicine

## 2011-05-31 ENCOUNTER — Encounter: Payer: Medicare Other | Admitting: Internal Medicine

## 2011-06-21 ENCOUNTER — Encounter: Payer: Medicare Other | Admitting: Internal Medicine

## 2011-06-27 ENCOUNTER — Ambulatory Visit (INDEPENDENT_AMBULATORY_CARE_PROVIDER_SITE_OTHER): Payer: Medicare Other | Admitting: Internal Medicine

## 2011-06-27 ENCOUNTER — Encounter: Payer: Self-pay | Admitting: Internal Medicine

## 2011-06-27 DIAGNOSIS — T887XXA Unspecified adverse effect of drug or medicament, initial encounter: Secondary | ICD-10-CM

## 2011-06-27 DIAGNOSIS — K7689 Other specified diseases of liver: Secondary | ICD-10-CM

## 2011-06-27 DIAGNOSIS — E785 Hyperlipidemia, unspecified: Secondary | ICD-10-CM

## 2011-06-27 DIAGNOSIS — Z23 Encounter for immunization: Secondary | ICD-10-CM

## 2011-06-27 DIAGNOSIS — I10 Essential (primary) hypertension: Secondary | ICD-10-CM

## 2011-06-27 DIAGNOSIS — Z0000 Encounter for general adult medical examination without abnormal findings: Secondary | ICD-10-CM

## 2011-06-27 DIAGNOSIS — R972 Elevated prostate specific antigen [PSA]: Secondary | ICD-10-CM

## 2011-06-27 DIAGNOSIS — M109 Gout, unspecified: Secondary | ICD-10-CM

## 2011-06-27 DIAGNOSIS — E039 Hypothyroidism, unspecified: Secondary | ICD-10-CM

## 2011-06-27 LAB — CBC WITH DIFFERENTIAL/PLATELET
Basophils Absolute: 0 10*3/uL (ref 0.0–0.1)
Eosinophils Relative: 2.8 % (ref 0.0–5.0)
HCT: 53.8 % — ABNORMAL HIGH (ref 39.0–52.0)
Hemoglobin: 17.9 g/dL — ABNORMAL HIGH (ref 13.0–17.0)
Lymphocytes Relative: 32.9 % (ref 12.0–46.0)
Lymphs Abs: 2.5 10*3/uL (ref 0.7–4.0)
Monocytes Relative: 11.4 % (ref 3.0–12.0)
Platelets: 400 10*3/uL (ref 150.0–400.0)
RDW: 13.3 % (ref 11.5–14.6)
WBC: 7.5 10*3/uL (ref 4.5–10.5)

## 2011-06-27 LAB — BASIC METABOLIC PANEL
CO2: 30 mEq/L (ref 19–32)
Calcium: 9.4 mg/dL (ref 8.4–10.5)
Chloride: 103 mEq/L (ref 96–112)
Creatinine, Ser: 1.1 mg/dL (ref 0.4–1.5)
Sodium: 142 mEq/L (ref 135–145)

## 2011-06-27 LAB — POCT URINALYSIS DIPSTICK
Glucose, UA: NEGATIVE
Nitrite, UA: NEGATIVE
Protein, UA: NEGATIVE
Urobilinogen, UA: 0.2

## 2011-06-27 LAB — HEPATIC FUNCTION PANEL
ALT: 83 U/L — ABNORMAL HIGH (ref 0–53)
Alkaline Phosphatase: 71 U/L (ref 39–117)
Bilirubin, Direct: 0.1 mg/dL (ref 0.0–0.3)
Total Bilirubin: 0.8 mg/dL (ref 0.3–1.2)
Total Protein: 7.4 g/dL (ref 6.0–8.3)

## 2011-06-27 LAB — LIPID PANEL
HDL: 39.2 mg/dL (ref >39.00)
LDL Cholesterol: 102 mg/dL — ABNORMAL HIGH (ref 0–99)
Total CHOL/HDL Ratio: 4
Triglycerides: 118 mg/dL (ref 0.0–149.0)

## 2011-06-27 LAB — URIC ACID: Uric Acid, Serum: 7.9 mg/dL — ABNORMAL HIGH (ref 4.0–7.8)

## 2011-06-27 LAB — TSH: TSH: 2.44 u[IU]/mL (ref 0.35–5.50)

## 2011-06-27 NOTE — Patient Instructions (Signed)
The patient is instructed to continue all medications as prescribed. Schedule followup with check out clerk upon leaving the clinic  

## 2011-06-27 NOTE — Progress Notes (Signed)
Subjective:    Patient ID: Stephen Morrison, male    DOB: May 18, 1946, 65 y.o.   MRN: [id number]  HPI Patient presented today for a Medicare wellness examination as well as followup for hyperlipidemia gout hypertension and history of GERD In the past she's had abnormal liver enzymes with suspicion of fatty deposition in the liver he is also had a history of a PSA that was elevated Will monitor today his liver enzymes his PSA he is basic metabolic panel uric acid and a lipid panel as well as a TSH for history of hypothyroidism  Review of Systems  Constitutional: Negative for fever and fatigue.  HENT: Negative for hearing loss, congestion, neck pain and postnasal drip.   Eyes: Negative for discharge, redness and visual disturbance.  Respiratory: Negative for cough, shortness of breath and wheezing.   Cardiovascular: Negative for leg swelling.  Gastrointestinal: Negative for abdominal pain, constipation and abdominal distention.  Genitourinary: Negative for urgency and frequency.  Musculoskeletal: Negative for joint swelling and arthralgias.  Skin: Negative for color change and rash.  Neurological: Negative for weakness and light-headedness.  Hematological: Negative for adenopathy.  Psychiatric/Behavioral: Negative for behavioral problems.   Past Medical History  Diagnosis Date  . Hyperlipidemia   . Hypertension   . PSA elevation   . Gout    Past Surgical History  Procedure Date  . Vasectomy     reports that he quit smoking about 37 years ago. He does not have any smokeless tobacco history on file. He reports that he drinks alcohol. He reports that he does not use illicit drugs. family history includes Alcohol abuse in his father and Dementia in his father. Not on File      Objective:   Physical Exam  Nursing note and vitals reviewed. Constitutional: He appears well-developed and well-nourished.  HENT:  Head: Normocephalic and atraumatic.  Eyes: Conjunctivae are normal. Pupils  are equal, round, and reactive to light.  Neck: Normal range of motion. Neck supple.  Cardiovascular: Normal rate and regular rhythm.   Pulmonary/Chest: Effort normal and breath sounds normal.  Abdominal: Soft. Bowel sounds are normal.          Assessment & Plan:  Appropriate screening laboratory work drawn as well as monitoring laboratory work for hypothyroidism a TSH T3-T4 will be drawn a lipid panel and a liver panel will be drawn for his hyperlipidemia a basic metabolic panel will be drawn for hypertension a uric acid will be drawn for the gout ubjective:    Stephen Morrison is a 65 y.o. male who presents for Medicare Annual/Subsequent preventive examination.   Preventive Screening-Counseling & Management  Tobacco History  Smoking status  . Former Smoker  . Quit date: 08/13/1973  Smokeless tobacco  . Not on file    Problems Prior to Visit 1.   Current Problems (verified) Patient Active Problem List  Diagnoses  . LIPOMA  . UNSPECIFIED HYPOTHYROIDISM  . HYPERLIPIDEMIA  . GOUT  . HYPERTENSION  . GASTRITIS  . FATTY LIVER DISEASE  . PROSTATE SPECIFIC ANTIGEN, ELEVATED    Medications Prior to Visit Current Outpatient Prescriptions on File Prior to Visit  Medication Sig Dispense Refill  . amLODipine-benazepril (LOTREL) 10-40 MG per capsule Take 1 capsule by mouth daily.  90 capsule  3  . aspirin 81 MG tablet Take 81 mg by mouth daily.        . cetirizine (ZYRTEC) 10 MG tablet Take 10 mg by mouth daily.        Marland Kitchen  clotrimazole-betamethasone (LOTRISONE) cream Apply topically 2 (two) times daily.        . colchicine 0.6 MG tablet Take 0.6 mg by mouth 2 (two) times daily as needed.        . diclofenac (VOLTAREN) 75 MG EC tablet Take 1 tablet (75 mg total) by mouth 2 (two) times daily with a meal.  60 tablet  2  . lansoprazole (PREVACID) 15 MG capsule Take 15 mg by mouth daily as needed.        Marland Kitchen levothyroxine (SYNTHROID, LEVOTHROID) 25 MCG tablet TAKE 1 TABLET DAILY  90 tablet   2  . tadalafil (CIALIS) 20 MG tablet Take 20 mg by mouth daily as needed.          Current Medications (verified) Current Outpatient Prescriptions  Medication Sig Dispense Refill  . chlorzoxazone (PARAFON) 500 MG tablet Take 500 mg by mouth 2 (two) times daily as needed.        Marland Kitchen amLODipine-benazepril (LOTREL) 10-40 MG per capsule Take 1 capsule by mouth daily.  90 capsule  3  . aspirin 81 MG tablet Take 81 mg by mouth daily.        . cetirizine (ZYRTEC) 10 MG tablet Take 10 mg by mouth daily.        . clotrimazole-betamethasone (LOTRISONE) cream Apply topically 2 (two) times daily.        . colchicine 0.6 MG tablet Take 0.6 mg by mouth 2 (two) times daily as needed.        . diclofenac (VOLTAREN) 75 MG EC tablet Take 1 tablet (75 mg total) by mouth 2 (two) times daily with a meal.  60 tablet  2  . lansoprazole (PREVACID) 15 MG capsule Take 15 mg by mouth daily as needed.        Marland Kitchen levothyroxine (SYNTHROID, LEVOTHROID) 25 MCG tablet TAKE 1 TABLET DAILY  90 tablet  2  . tadalafil (CIALIS) 20 MG tablet Take 20 mg by mouth daily as needed.           Allergies (verified) Review of patient's allergies indicates not on file.   PAST HISTORY  Family History Family History  Problem Relation Age of Onset  . Alcohol abuse Father   . Dementia Father     Social History History  Substance Use Topics  . Smoking status: Former Smoker    Quit date: 08/13/1973  . Smokeless tobacco: Not on file  . Alcohol Use: Yes     OCCASIONALLY    Are there smokers in your home (other than you)?  No  Risk Factors Current exercise habits: Gym/ health club routine includes cardio and treadmill.   Dietary issues discussed: no   Cardiac risk factors: advanced age (older than 31 for men, 15 for women) and male gender.  Depression Screen (Note: if answer to either of the following is "Yes", a more complete depression screening is indicated)   Q1: Over the past two weeks, have you felt down, depressed or  hopeless? No  Q2: Over the past two weeks, have you felt little interest or pleasure in doing things? No  Have you lost interest or pleasure in daily life? No  Do you often feel hopeless? No  Do you cry easily over simple problems? No  Activities of Daily Living In your present state of health, do you have any difficulty performing the following activities?:  Driving? No Managing money?  \no Feeding yourself? No Getting from bed to chair? No Climbing a flight of  stairs? No Preparing food and eating?: no Bathing or showering? No Getting dressed: No Getting to the toilet? No Using the toilet:No Moving around from place to place: No In the past year have you fallen or had a near fall?:No   Are you sexually active?  No  Do you have more than one partner?  No  Hearing Difficulties: No Do you often ask people to speak up or repeat themselves? No Do you experience ringing or noises in your ears? No Do you have difficulty understanding soft or whispered voices? No   Do you feel that you have a problem with memory? No  Do you often misplace items? No  Do you feel safe at home?  No  Cognitive Testing  Alert? yes Normal Appearance?Yes  Oriented to person? yes  Place? yes   Time? Yes  Recall of three objects?  Yes  Can perform simple calculations? No  Displays appropriate judgment?Yes  Can read the correct time from a watch face?Yes   Advanced Directives have been discussed with the patient? Yes   List the Names of Other Physician/Practitioners you currently use: 1.    Indicate any recent Medical Services you may have received from other than [facility] providers in the past year (date may be approximate).  Immunization History  Administered Date(s) Administered  . Influenza Split 06/27/2011  . Influenza Whole 09/24/2007, 05/03/2008, 09/28/2009  . Pneumococcal Polysaccharide 10/25/2008  . Td 09/24/2007    Screening Tests Health Maintenance  Topic Date Due  . Zostavax   05/25/2004  . Pneumococcal Polysaccharide Vaccine Age 28 And Over  05/25/2009  . Influenza Vaccine  05/14/2011  . Colonoscopy  01/30/2017  . Tetanus/tdap  09/23/2017    All answers were reviewed with the patient and necessary referrals were made:  Stephen Morrison, Stephen Morrison   06/27/2011   History reviewed: allergies, current medications, past family history, past medical history, past social history, past surgical history and problem list  Review of Systems A comprehensive review of systems was negative.    Objective:     Vision by Snellen chart: right eye:20/20, left eye:20/20 Blood pressure 140/80, pulse 72, temperature 98.3 F (36.8 C), resp. rate 16, height 5\' 11"  (1.803 m), weight 214 lb (97.07 kg). Body mass index is 29.85 kg/(m^2).  Exam per office note     Assessment:      Patient presents for yearly preventative medicine examination.   all immunizations and health maintenance protocols were reviewed with the patient and they are up to date with these protocols.   screening laboratory values were reviewed with the patient including screening of hyperlipidemia PSA renal function and hepatic function.   There medications past medical history social history problem list and allergies were reviewed in detail.   Goals were established with regard to weight loss exercise diet in compliance with medications      Plan:     During the course of the visit the patient was educated and counseled about appropriate screening and preventive services including:    Pneumococcal vaccine   Influenza vaccine  Prostate cancer screening  Diet review for nutrition referral? Yes ____  Not Indicated ____   Patient Instructions (the written plan) was given to the patient.  Medicare Attestation I have personally reviewed: The patient's medical and social history Their use of alcohol, tobacco or illicit drugs Their current medications and supplements The patient's functional ability  including ADLs,fall risks, home safety risks, cognitive, and hearing and visual impairment Diet and physical activities  Evidence for depression or mood disorders  The patient's weight, height, BMI, and visual acuity have been recorded in the chart.  I have made referrals, counseling, and provided education to the patient based on review of the above and I have provided the patient with a written personalized care plan for preventive services.     OVID, WITMAN   06/27/2011

## 2011-10-23 ENCOUNTER — Other Ambulatory Visit: Payer: Self-pay | Admitting: Internal Medicine

## 2011-12-19 ENCOUNTER — Ambulatory Visit (INDEPENDENT_AMBULATORY_CARE_PROVIDER_SITE_OTHER): Payer: Medicare Other | Admitting: Family

## 2011-12-19 ENCOUNTER — Encounter: Payer: Self-pay | Admitting: Family

## 2011-12-19 VITALS — BP 152/80 | Temp 101.1°F | Wt 218.0 lb

## 2011-12-19 DIAGNOSIS — J069 Acute upper respiratory infection, unspecified: Secondary | ICD-10-CM | POA: Diagnosis not present

## 2011-12-19 DIAGNOSIS — R509 Fever, unspecified: Secondary | ICD-10-CM

## 2011-12-19 DIAGNOSIS — R05 Cough: Secondary | ICD-10-CM

## 2011-12-19 MED ORDER — AMOXICILLIN 500 MG PO TABS: 1000.0000 mg | ORAL_TABLET | Freq: Two times a day (BID) | ORAL | Status: DC

## 2011-12-19 NOTE — Patient Instructions (Signed)

## 2011-12-19 NOTE — Progress Notes (Signed)
Subjective:    Patient ID: Stephen Morrison, male    DOB: [birth year], 66 y.o.   MRN: [id number]  HPI 66 year old white male, nonsmoker, patient of Dr. Lovell Sheehan is in today with complaints of cough, congestion, fever and chills x4 days. Cough is productive with yellow phlegm. His symptoms are worsening. Reports spreading 40 bales of pine needles over the weekend. He believes this is what has caused him to fall he'll. His intake and ibuprofen help control his fever with moderate relief. Denies any lightheadedness, dizziness, chest pain, palpitations, shortness of breath or edema.   Review of Systems  Constitutional: Positive for fever, chills and fatigue.  HENT: Positive for postnasal drip. Negative for sore throat and sneezing.   Respiratory: Positive for cough.   Cardiovascular: Negative.   Gastrointestinal: Negative.   Musculoskeletal: Negative.   Skin: Negative.   Hematological: Negative.   Psychiatric/Behavioral: Negative.    Past Medical History  Diagnosis Date  . Hyperlipidemia   . Hypertension   . PSA elevation   . Gout     History   Social History  . Marital Status: Divorced    Spouse Name: N/A    Number of Children: N/A  . Years of Education: N/A   Occupational History  . Not on file.   Social History Main Topics  . Smoking status: Former Smoker    Quit date: 08/13/1973  . Smokeless tobacco: Not on file  . Alcohol Use: Yes     OCCASIONALLY  . Drug Use: No  . Sexually Active: Yes   Other Topics Concern  . Not on file   Social History Narrative  . No narrative on file    Past Surgical History  Procedure Date  . Vasectomy     Family History  Problem Relation Age of Onset  . Alcohol abuse Father   . Dementia Father     No Known Allergies  Current Outpatient Prescriptions on File Prior to Visit  Medication Sig Dispense Refill  . amLODipine-benazepril (LOTREL) 10-40 MG per capsule TAKE 1 CAPSULE DAILY  90 capsule  2  . aspirin 81 MG tablet Take 81 mg  by mouth daily.        . cetirizine (ZYRTEC) 10 MG tablet Take 10 mg by mouth daily.        . chlorzoxazone (PARAFON) 500 MG tablet Take 500 mg by mouth 2 (two) times daily as needed.        . clotrimazole-betamethasone (LOTRISONE) cream Apply topically 2 (two) times daily.        . colchicine 0.6 MG tablet Take 0.6 mg by mouth 2 (two) times daily as needed.        . diclofenac (VOLTAREN) 75 MG EC tablet Take 1 tablet (75 mg total) by mouth 2 (two) times daily with a meal.  60 tablet  2  . lansoprazole (PREVACID) 15 MG capsule Take 15 mg by mouth daily as needed.        Marland Kitchen levothyroxine (SYNTHROID, LEVOTHROID) 25 MCG tablet TAKE 1 TABLET DAILY  90 tablet  2  . tadalafil (CIALIS) 20 MG tablet Take 20 mg by mouth daily as needed.          BP 152/80  Temp(Src) 101.1 F (38.4 C) (Oral)  Wt 218 lb (98.884 kg)chart    Objective:   Physical Exam  Constitutional: He is oriented to person, place, and time. He appears well-developed and well-nourished.  Cardiovascular: Normal rate, regular rhythm and normal heart sounds.  Pulmonary/Chest: Effort normal and breath sounds normal.  Neurological: He is alert and oriented to person, place, and time.  Skin: Skin is warm.  Psychiatric: He has a normal mood and affect.          Assessment & Plan:  Assessment: Upper respiratory infection, cough, fever  Plan: Amoxicillin 500 mg 2 capsules by mouth twice a day x10 days. Over-the-counter Mucinex when necessary. Rest. Drink plenty of fluids. Patient call the office if his symptoms worsen or persist. Recheck a schedule, when necessary.

## 2011-12-21 ENCOUNTER — Telehealth: Payer: Self-pay | Admitting: *Deleted

## 2011-12-21 ENCOUNTER — Telehealth: Payer: Self-pay

## 2011-12-21 MED ORDER — LEVOFLOXACIN 500 MG PO TABS: 500.0000 mg | ORAL_TABLET | Freq: Every day | ORAL | Status: AC

## 2011-12-21 NOTE — Telephone Encounter (Signed)
Per dr Lovell Sheehan- change to levaquin 500 qd for 7 days

## 2011-12-21 NOTE — Telephone Encounter (Signed)
Wife informed med sent in

## 2011-12-21 NOTE — Telephone Encounter (Signed)
Take some benadryl and watch- if it gets wrose call md per dr Lovell Sheehan- pt informed

## 2011-12-21 NOTE — Telephone Encounter (Signed)
Took levaquin and  then took shower 30 minutes later and noticed rash on back--doesn't know if it has been there or if new-not itching nor painful

## 2011-12-21 NOTE — Telephone Encounter (Signed)
Was seen by P. Orvan Falconer earlier this week - dx URI - on Amox 1000 bid , taking ibuprofen to help with fever - no improvement - still running 101 , chills and night sweats , doesnt feel any better - would like a call from [place]

## 2011-12-26 ENCOUNTER — Ambulatory Visit (INDEPENDENT_AMBULATORY_CARE_PROVIDER_SITE_OTHER): Payer: Medicare Other | Admitting: Internal Medicine

## 2011-12-26 ENCOUNTER — Encounter: Payer: Self-pay | Admitting: Internal Medicine

## 2011-12-26 VITALS — BP 130/74 | HR 84 | Temp 98.2°F | Resp 16 | Ht 72.0 in | Wt 212.0 lb

## 2011-12-26 DIAGNOSIS — I10 Essential (primary) hypertension: Secondary | ICD-10-CM

## 2011-12-26 DIAGNOSIS — E039 Hypothyroidism, unspecified: Secondary | ICD-10-CM | POA: Diagnosis not present

## 2011-12-26 DIAGNOSIS — E785 Hyperlipidemia, unspecified: Secondary | ICD-10-CM

## 2011-12-26 LAB — BASIC METABOLIC PANEL
BUN: 11 mg/dL (ref 6–23)
CO2: 27 mEq/L (ref 19–32)
Chloride: 103 mEq/L (ref 96–112)
Glucose, Bld: 101 mg/dL — ABNORMAL HIGH (ref 70–99)
Potassium: 3.2 mEq/L — ABNORMAL LOW (ref 3.5–5.1)
Sodium: 138 mEq/L (ref 135–145)

## 2011-12-26 LAB — LIPID PANEL
HDL: 26.4 mg/dL — ABNORMAL LOW (ref >39.00)
Total CHOL/HDL Ratio: 6
VLDL: 41.8 mg/dL — ABNORMAL HIGH (ref 0.0–40.0)

## 2011-12-26 LAB — LDL CHOLESTEROL, DIRECT: Direct LDL: 102.3 mg/dL

## 2011-12-26 NOTE — Progress Notes (Signed)
Subjective:    Patient ID: Stephen Morrison, male    DOB: 07-Jul-1946, 66 y.o.   MRN: [id number]  HPI Pt had low grade fever, sinus pressure and moderate cough. Had a rash to amoxil now on allergy. Blood pressure good. This is the six month  ROV for lipid management and to reviewed HTN and thyroid replacement   Review of Systems  Constitutional: Negative for fever and fatigue.  HENT: Negative for hearing loss, congestion, neck pain and postnasal drip.   Eyes: Negative for discharge, redness and visual disturbance.  Respiratory: Negative for cough, shortness of breath and wheezing.   Cardiovascular: Negative for leg swelling.  Gastrointestinal: Negative for abdominal pain, constipation and abdominal distention.  Genitourinary: Negative for urgency and frequency.  Musculoskeletal: Negative for joint swelling and arthralgias.  Skin: Negative for color change and rash.  Neurological: Negative for weakness and light-headedness.  Hematological: Negative for adenopathy.  Psychiatric/Behavioral: Negative for behavioral problems.   Past Medical History  Diagnosis Date  . Hyperlipidemia   . Hypertension   . PSA elevation   . Gout     History   Social History  . Marital Status: Divorced    Spouse Name: N/A    Number of Children: N/A  . Years of Education: N/A   Occupational History  . Not on file.   Social History Main Topics  . Smoking status: Former Smoker    Quit date: 08/13/1973  . Smokeless tobacco: Not on file  . Alcohol Use: Yes     OCCASIONALLY  . Drug Use: No  . Sexually Active: Yes   Other Topics Concern  . Not on file   Social History Narrative  . No narrative on file    Past Surgical History  Procedure Date  . Vasectomy     Family History  Problem Relation Age of Onset  . Alcohol abuse Father   . Dementia Father     Allergies  Allergen Reactions  . Amoxicillin Rash and Other (See Comments)    Tongue swells    Current Outpatient Prescriptions on  File Prior to Visit  Medication Sig Dispense Refill  . amLODipine-benazepril (LOTREL) 10-40 MG per capsule TAKE 1 CAPSULE DAILY  90 capsule  2  . aspirin 81 MG tablet Take 81 mg by mouth daily.        . cetirizine (ZYRTEC) 10 MG tablet Take 10 mg by mouth daily.        . chlorzoxazone (PARAFON) 500 MG tablet Take 500 mg by mouth 2 (two) times daily as needed.        . clotrimazole-betamethasone (LOTRISONE) cream Apply topically 2 (two) times daily.        . colchicine 0.6 MG tablet Take 0.6 mg by mouth 2 (two) times daily as needed.        . diclofenac (VOLTAREN) 75 MG EC tablet Take 1 tablet (75 mg total) by mouth 2 (two) times daily with a meal.  60 tablet  2  . lansoprazole (PREVACID) 15 MG capsule Take 15 mg by mouth daily as needed.        Marland Kitchen levofloxacin (LEVAQUIN) 500 MG tablet Take 1 tablet (500 mg total) by mouth daily.  7 tablet  0  . levothyroxine (SYNTHROID, LEVOTHROID) 25 MCG tablet TAKE 1 TABLET DAILY  90 tablet  2  . tadalafil (CIALIS) 20 MG tablet Take 20 mg by mouth daily as needed.          BP 130/74  Pulse  84  Temp 98.2 F (36.8 C)  Resp 16  Ht 6' (1.829 m)  Wt 212 lb (96.163 kg)  BMI 28.75 kg/m2       Objective:   Physical Exam  Nursing note and vitals reviewed. Constitutional: He appears well-developed and well-nourished.  HENT:  Head: Normocephalic and atraumatic.  Eyes: Conjunctivae are normal. Pupils are equal, round, and reactive to light.  Neck: Normal range of motion. Neck supple.  Cardiovascular: Normal rate and regular rhythm.   Pulmonary/Chest: Effort normal and breath sounds normal.  Abdominal: Soft. Bowel sounds are normal.          Assessment & Plan:  Resolved URI and sinusitis Today we're going to monitor his basic metabolic panel for potassium and kidney function on blood pressure medications and to look at her lipid panel for monitoring of his HDL LDL and total cholesterol.  We will also check a TSH she is on minimal thyroid  replacement and monitor to see if replacement is to be titrated while.

## 2011-12-26 NOTE — Patient Instructions (Signed)
The patient is instructed to continue all medications as prescribed. Schedule followup with check out clerk upon leaving the clinic  

## 2012-01-30 ENCOUNTER — Other Ambulatory Visit: Payer: Self-pay | Admitting: Internal Medicine

## 2012-06-13 ENCOUNTER — Other Ambulatory Visit: Payer: Self-pay | Admitting: Internal Medicine

## 2012-07-02 ENCOUNTER — Encounter: Payer: Self-pay | Admitting: Internal Medicine

## 2012-07-02 ENCOUNTER — Ambulatory Visit (INDEPENDENT_AMBULATORY_CARE_PROVIDER_SITE_OTHER): Payer: Medicare Other | Admitting: Internal Medicine

## 2012-07-02 VITALS — BP 140/80 | HR 72 | Temp 98.2°F | Resp 16 | Ht 72.0 in | Wt 216.0 lb

## 2012-07-02 DIAGNOSIS — Z23 Encounter for immunization: Secondary | ICD-10-CM

## 2012-07-02 DIAGNOSIS — D649 Anemia, unspecified: Secondary | ICD-10-CM | POA: Diagnosis not present

## 2012-07-02 DIAGNOSIS — I10 Essential (primary) hypertension: Secondary | ICD-10-CM | POA: Diagnosis not present

## 2012-07-02 DIAGNOSIS — K7689 Other specified diseases of liver: Secondary | ICD-10-CM

## 2012-07-02 DIAGNOSIS — E785 Hyperlipidemia, unspecified: Secondary | ICD-10-CM | POA: Diagnosis not present

## 2012-07-02 DIAGNOSIS — R972 Elevated prostate specific antigen [PSA]: Secondary | ICD-10-CM

## 2012-07-02 DIAGNOSIS — M109 Gout, unspecified: Secondary | ICD-10-CM

## 2012-07-02 DIAGNOSIS — E039 Hypothyroidism, unspecified: Secondary | ICD-10-CM | POA: Diagnosis not present

## 2012-07-02 DIAGNOSIS — Z0000 Encounter for general adult medical examination without abnormal findings: Secondary | ICD-10-CM | POA: Diagnosis not present

## 2012-07-02 LAB — BASIC METABOLIC PANEL
BUN: 15 mg/dL (ref 6–23)
Chloride: 101 mEq/L (ref 96–112)
Creatinine, Ser: 1.1 mg/dL (ref 0.4–1.5)
GFR: 68.57 mL/min (ref >60.00)
Glucose, Bld: 100 mg/dL — ABNORMAL HIGH (ref 70–99)

## 2012-07-02 LAB — PSA: PSA: 2.8 ng/mL (ref 0.10–4.00)

## 2012-07-02 LAB — CBC WITH DIFFERENTIAL/PLATELET
Basophils Absolute: 0 10*3/uL (ref 0.0–0.1)
Basophils Relative: 0.6 % (ref 0.0–3.0)
Eosinophils Absolute: 0.1 10*3/uL (ref 0.0–0.7)
Hemoglobin: 17 g/dL (ref 13.0–17.0)
Lymphocytes Relative: 35.6 % (ref 12.0–46.0)
Lymphs Abs: 2.1 10*3/uL (ref 0.7–4.0)
MCHC: 33.5 g/dL (ref 30.0–36.0)
MCV: 90.1 fl (ref 78.0–100.0)
Monocytes Absolute: 0.7 10*3/uL (ref 0.1–1.0)
Neutro Abs: 2.9 10*3/uL (ref 1.4–7.7)
RDW: 13.2 % (ref 11.5–14.6)

## 2012-07-02 LAB — POCT URINALYSIS DIPSTICK
Bilirubin, UA: NEGATIVE
Blood, UA: NEGATIVE
Nitrite, UA: NEGATIVE
Spec Grav, UA: 1.01
pH, UA: 7

## 2012-07-02 LAB — HEPATIC FUNCTION PANEL
ALT: 61 U/L — ABNORMAL HIGH (ref 0–53)
AST: 42 U/L — ABNORMAL HIGH (ref 0–37)
Alkaline Phosphatase: 67 U/L (ref 39–117)
Bilirubin, Direct: 0.2 mg/dL (ref 0.0–0.3)
Total Bilirubin: 0.8 mg/dL (ref 0.3–1.2)

## 2012-07-02 LAB — LIPID PANEL
Cholesterol: 169 mg/dL (ref 0–200)
VLDL: 29 mg/dL (ref 0.0–40.0)

## 2012-07-02 LAB — TSH: TSH: 2.98 u[IU]/mL (ref 0.35–5.50)

## 2012-07-02 NOTE — Progress Notes (Signed)
Subjective:    Patient ID: Stephen Morrison, male    DOB: [birth year], 66 y.o.   MRN: [id number]  HPI Patient presented for medicare wellness exam and for monitoring of BPH, HTN, hyperlipidemia and hypothyroidism. Pt has also noted elevated LFT's in the past related to fatty liver.    Review of Systems  Constitutional: Negative for fever and fatigue.  HENT: Negative for hearing loss, congestion, neck pain and postnasal drip.   Eyes: Negative for discharge, redness and visual disturbance.  Respiratory: Negative for cough, shortness of breath and wheezing.   Cardiovascular: Negative for leg swelling.  Gastrointestinal: Negative for abdominal pain, constipation and abdominal distention.  Genitourinary: Positive for frequency. Negative for urgency.       Nocturia  Musculoskeletal: Positive for myalgias and joint swelling. Negative for arthralgias.  Skin: Negative for color change and rash.  Neurological: Negative for weakness and light-headedness.  Hematological: Negative for adenopathy.  Psychiatric/Behavioral: Negative for behavioral problems.   Past Medical History  Diagnosis Date  . Hyperlipidemia   . Hypertension   . PSA elevation   . Gout     History   Social History  . Marital Status: Divorced    Spouse Name: N/A    Number of Children: N/A  . Years of Education: N/A   Occupational History  . Not on file.   Social History Main Topics  . Smoking status: Former Smoker    Quit date: 08/13/1973  . Smokeless tobacco: Not on file  . Alcohol Use: Yes     Comment: OCCASIONALLY  . Drug Use: No  . Sexually Active: Yes   Other Topics Concern  . Not on file   Social History Narrative  . No narrative on file    Past Surgical History  Procedure Date  . Vasectomy     Family History  Problem Relation Age of Onset  . Alcohol abuse Father   . Dementia Father     Allergies  Allergen Reactions  . Amoxicillin Rash and Other (See Comments)    Tongue swells     Current Outpatient Prescriptions on File Prior to Visit  Medication Sig Dispense Refill  . aspirin 81 MG tablet Take 81 mg by mouth daily.        . cetirizine (ZYRTEC) 10 MG tablet Take 10 mg by mouth daily.        . chlorzoxazone (PARAFON) 500 MG tablet TAKE 1 TABLET TWICE A DAY AS NEEDED FOR MUSCLE SPASMS  30 tablet  1  . clotrimazole-betamethasone (LOTRISONE) cream Apply topically 2 (two) times daily.        . colchicine 0.6 MG tablet Take 0.6 mg by mouth 2 (two) times daily as needed.        . lansoprazole (PREVACID) 15 MG capsule Take 15 mg by mouth daily as needed.        Marland Kitchen levothyroxine (SYNTHROID, LEVOTHROID) 25 MCG tablet TAKE 1 TABLET DAILY  90 tablet  1  . tadalafil (CIALIS) 20 MG tablet Take 20 mg by mouth daily as needed.        Marland Kitchen VYTORIN 10-40 MG per tablet TAKE 1 TABLET AS DIRECTED ON MONDAY, WEDNESDAY, AND FRIDAY NIGHTS  90 tablet  2  . amLODipine-benazepril (LOTREL) 10-40 MG per capsule TAKE 1 CAPSULE DAILY  90 capsule  1    BP 140/80  Pulse 72  Temp 98.2 F (36.8 C)  Resp 16  Ht 6' (1.829 m)  Wt 216 lb (97.977 kg)  BMI 29.29  kg/m2       Objective:   Physical Exam  Constitutional: He is oriented to person, place, and time. He appears well-developed and well-nourished.  HENT:  Head: Normocephalic and atraumatic.  Eyes: Conjunctivae normal are normal. Pupils are equal, round, and reactive to light.  Neck: Normal range of motion. Neck supple.  Cardiovascular: Normal rate and regular rhythm.   Murmur heard. Pulmonary/Chest: Effort normal and breath sounds normal.  Abdominal: Soft. Bowel sounds are normal.  Genitourinary:       enlarged  Musculoskeletal: Normal range of motion. He exhibits tenderness.  Neurological: He is alert and oriented to person, place, and time.  Skin: Skin is dry.  Psychiatric: He has a normal mood and affect.          Assessment & Plan:  monitor lipid panel on vytorin Assess fatty liver and triglyceride control Stable  HTN Monitor TSH for thyroid replacement Subjective:    Stephen Morrison is a 66 y.o. male who presents for Medicare Annual/Subsequent preventive examination.   Preventive Screening-Counseling & Management  Tobacco History  Smoking status  . Former Smoker  . Quit date: 08/13/1973  Smokeless tobacco  . Not on file    Problems Prior to Visit 1.   Current Problems (verified) Patient Active Problem List  Diagnosis  . LIPOMA  . UNSPECIFIED HYPOTHYROIDISM  . HYPERLIPIDEMIA  . GOUT  . HYPERTENSION  . FATTY LIVER DISEASE  . PROSTATE SPECIFIC ANTIGEN, ELEVATED    Medications Prior to Visit Current Outpatient Prescriptions on File Prior to Visit  Medication Sig Dispense Refill  . aspirin 81 MG tablet Take 81 mg by mouth daily.        . cetirizine (ZYRTEC) 10 MG tablet Take 10 mg by mouth daily.        . chlorzoxazone (PARAFON) 500 MG tablet TAKE 1 TABLET TWICE A DAY AS NEEDED FOR MUSCLE SPASMS  30 tablet  1  . clotrimazole-betamethasone (LOTRISONE) cream Apply topically 2 (two) times daily.        . colchicine 0.6 MG tablet Take 0.6 mg by mouth 2 (two) times daily as needed.        . lansoprazole (PREVACID) 15 MG capsule Take 15 mg by mouth daily as needed.        Marland Kitchen levothyroxine (SYNTHROID, LEVOTHROID) 25 MCG tablet TAKE 1 TABLET DAILY  90 tablet  1  . tadalafil (CIALIS) 20 MG tablet Take 20 mg by mouth daily as needed.        Marland Kitchen VYTORIN 10-40 MG per tablet TAKE 1 TABLET AS DIRECTED ON MONDAY, WEDNESDAY, AND FRIDAY NIGHTS  90 tablet  2  . amLODipine-benazepril (LOTREL) 10-40 MG per capsule TAKE 1 CAPSULE DAILY  90 capsule  1    Current Medications (verified) Current Outpatient Prescriptions  Medication Sig Dispense Refill  . aspirin 81 MG tablet Take 81 mg by mouth daily.        . cetirizine (ZYRTEC) 10 MG tablet Take 10 mg by mouth daily.        . chlorzoxazone (PARAFON) 500 MG tablet TAKE 1 TABLET TWICE A DAY AS NEEDED FOR MUSCLE SPASMS  30 tablet  1  .  clotrimazole-betamethasone (LOTRISONE) cream Apply topically 2 (two) times daily.        . colchicine 0.6 MG tablet Take 0.6 mg by mouth 2 (two) times daily as needed.        . diclofenac (VOLTAREN) 75 MG EC tablet Take 75 mg by mouth 2 (two)  times daily as needed.      . lansoprazole (PREVACID) 15 MG capsule Take 15 mg by mouth daily as needed.        Marland Kitchen levothyroxine (SYNTHROID, LEVOTHROID) 25 MCG tablet TAKE 1 TABLET DAILY  90 tablet  1  . tadalafil (CIALIS) 20 MG tablet Take 20 mg by mouth daily as needed.        Marland Kitchen VYTORIN 10-40 MG per tablet TAKE 1 TABLET AS DIRECTED ON MONDAY, WEDNESDAY, AND FRIDAY NIGHTS  90 tablet  2  . amLODipine-benazepril (LOTREL) 10-40 MG per capsule TAKE 1 CAPSULE DAILY  90 capsule  1     Allergies (verified) Amoxicillin   PAST HISTORY  Family History Family History  Problem Relation Age of Onset  . Alcohol abuse Father   . Dementia Father     Social History History  Substance Use Topics  . Smoking status: Former Smoker    Quit date: 08/13/1973  . Smokeless tobacco: Not on file  . Alcohol Use: Yes     Comment: OCCASIONALLY    Are there smokers in your home (other than you)?  No  Risk Factors Current exercise habits: Gym/ health club routine includes cardio, swimming and treadmill.  Dietary issues discussed: none   Cardiac risk factors: male gender.  Depression Screen (Note: if answer to either of the following is "Yes", a more complete depression screening is indicated)   Q1: Over the past two weeks, have you felt down, depressed or hopeless? No  Q2: Over the past two weeks, have you felt little interest or pleasure in doing things? No  Have you lost interest or pleasure in daily life? No  Do you often feel hopeless? No  Do you cry easily over simple problems? No  Activities of Daily Living In your present state of health, do you have any difficulty performing the following activities?:  Driving? No Managing money?  No Feeding yourself?  No Getting from bed to chair? No Climbing a flight of stairs? No Preparing food and eating?: No Bathing or showering? No Getting dressed: No Getting to the toilet? No Using the toilet:No Moving around from place to place: No In the past year have you fallen or had a near fall?:No   Are you sexually active?  Yes  Do you have more than one partner?  No  Hearing Difficulties: No Do you often ask people to speak up or repeat themselves? No Do you experience ringing or noises in your ears? No Do you have difficulty understanding soft or whispered voices? No   Do you feel that you have a problem with memory? No  Do you often misplace items? No  Do you feel safe at home?  No  Cognitive Testing  Alert? Yes  Normal Appearance?Yes  Oriented to person? Yes  Place? Yes   Time? Yes  Recall of three objects?  Yes  Can perform simple calculations? Yes  Displays appropriate judgment?Yes  Can read the correct time from a watch face?Yes   Advanced Directives have been discussed with the patient? Yes   List the Names of Other Physician/Practitioners you currently use: 1.    Indicate any recent Medical Services you may have received from other than [facility] providers in the past year (date may be approximate).  Immunization History  Administered Date(s) Administered  . Influenza Split 06/27/2011, 07/02/2012  . Influenza Whole 09/24/2007, 05/03/2008, 09/28/2009  . Pneumococcal Polysaccharide 10/25/2008  . Td 09/24/2007    Screening Tests Health Maintenance  Topic Date Due  . Zostavax  05/25/2004  . Pneumococcal Polysaccharide Vaccine Age 31 And Over  05/25/2009  . Influenza Vaccine  04/13/2013  . Colonoscopy  01/30/2017  . Tetanus/tdap  09/23/2017    All answers were reviewed with the patient and necessary referrals were made:  Carrie Mew, MD   08/24/2012   History reviewed: allergies, current medications, past family history, past medical history, past social history, past  surgical history and problem list  Review of Systems A comprehensive review of systems was negative.    Objective:     Vision by Snellen chart: right eye:20/20, left eye:20/20 Blood pressure 140/80, pulse 72, temperature 98.2 F (36.8 C), resp. rate 16, height 6' (1.829 m), weight 216 lb (97.977 kg). Body mass index is 29.29 kg/(m^2).  BP 140/80  Pulse 72  Temp 98.2 F (36.8 C)  Resp 16  Ht 6' (1.829 m)  Wt 216 lb (97.977 kg)  BMI 29.29 kg/m2  General Appearance:    Alert, cooperative, no distress, appears stated age  Head:    Normocephalic, without obvious abnormality, atraumatic  Eyes:    PERRL, conjunctiva/corneas clear, EOM's intact, fundi    benign, both eyes       Ears:    Normal TM's and external ear canals, both ears  Nose:   Nares normal, septum midline, mucosa normal, no drainage    or sinus tenderness  Throat:   Lips, mucosa, and tongue normal; teeth and gums normal  Neck:   Supple, symmetrical, trachea midline, no adenopathy;       thyroid:  No enlargement/tenderness/nodules; no carotid   bruit or JVD  Back:     Symmetric, no curvature, ROM normal, no CVA tenderness  Lungs:     Clear to auscultation bilaterally, respirations unlabored  Chest wall:    No tenderness or deformity  Heart:    Regular rate and rhythm, S1 and S2 normal, no murmur, rub   or gallop  Abdomen:     Soft, non-tender, bowel sounds active all four quadrants,    no masses, no organomegaly  Genitalia:    Normal male without lesion, discharge or tenderness  Rectal:    Normal tone, normal prostate, no masses or tenderness;   guaiac negative stool  Extremities:   Extremities normal, atraumatic, no cyanosis or edema  Pulses:   2+ and symmetric all extremities  Skin:   Skin color, texture, turgor normal, no rashes or lesions  Lymph nodes:   Cervical, supraclavicular, and axillary nodes normal  Neurologic:   CNII-XII intact. Normal strength, sensation and reflexes      throughout        Assessment:      Patient presents for yearly preventative medicine examination.   all immunizations and health maintenance protocols were reviewed with the patient and they are up to date with these protocols.   screening laboratory values were reviewed with the patient including screening of hyperlipidemia PSA renal function and hepatic function.   There medications past medical history social history problem list and allergies were reviewed in detail.   Goals were established with regard to weight loss exercise diet in compliance with medications      Plan:     During the course of the visit the patient was educated and counseled about appropriate screening and preventive services including:    Influenza vaccine  Td vaccine  Colorectal cancer screening x Diet review for nutrition referral? Yes ____  Not Indicated ____   Patient  Instructions (the written plan) was given to the patient.  Medicare Attestation I have personally reviewed: The patient's medical and social history Their use of alcohol, tobacco or illicit drugs Their current medications and supplements The patient's functional ability including ADLs,fall risks, home safety risks, cognitive, and hearing and visual impairment Diet and physical activities Evidence for depression or mood disorders  The patient's weight, height, BMI, and visual acuity have been recorded in the chart.  I have made referrals, counseling, and provided education to the patient based on review of the above and I have provided the patient with a written personalized care plan for preventive services.     Carrie Mew, MD   08/24/2012

## 2012-08-09 ENCOUNTER — Other Ambulatory Visit: Payer: Self-pay | Admitting: Internal Medicine

## 2012-09-11 ENCOUNTER — Other Ambulatory Visit: Payer: Self-pay | Admitting: Dermatology

## 2012-09-11 DIAGNOSIS — L820 Inflamed seborrheic keratosis: Secondary | ICD-10-CM | POA: Diagnosis not present

## 2012-09-11 DIAGNOSIS — D485 Neoplasm of uncertain behavior of skin: Secondary | ICD-10-CM | POA: Diagnosis not present

## 2012-09-11 DIAGNOSIS — D1801 Hemangioma of skin and subcutaneous tissue: Secondary | ICD-10-CM | POA: Diagnosis not present

## 2012-09-11 DIAGNOSIS — L821 Other seborrheic keratosis: Secondary | ICD-10-CM | POA: Diagnosis not present

## 2012-09-11 DIAGNOSIS — L851 Acquired keratosis [keratoderma] palmaris et plantaris: Secondary | ICD-10-CM | POA: Diagnosis not present

## 2012-09-11 DIAGNOSIS — D239 Other benign neoplasm of skin, unspecified: Secondary | ICD-10-CM | POA: Diagnosis not present

## 2012-09-23 ENCOUNTER — Other Ambulatory Visit: Payer: Self-pay | Admitting: Internal Medicine

## 2012-11-17 DIAGNOSIS — M25469 Effusion, unspecified knee: Secondary | ICD-10-CM | POA: Diagnosis not present

## 2012-11-17 DIAGNOSIS — M25569 Pain in unspecified knee: Secondary | ICD-10-CM | POA: Diagnosis not present

## 2012-12-29 ENCOUNTER — Ambulatory Visit (INDEPENDENT_AMBULATORY_CARE_PROVIDER_SITE_OTHER): Payer: Medicare Other | Admitting: Internal Medicine

## 2012-12-29 ENCOUNTER — Encounter: Payer: Self-pay | Admitting: Internal Medicine

## 2012-12-29 VITALS — BP 140/80 | HR 72 | Temp 98.1°F | Resp 16 | Ht 71.0 in | Wt 220.0 lb

## 2012-12-29 DIAGNOSIS — M1710 Unilateral primary osteoarthritis, unspecified knee: Secondary | ICD-10-CM | POA: Diagnosis not present

## 2012-12-29 DIAGNOSIS — M25569 Pain in unspecified knee: Secondary | ICD-10-CM | POA: Diagnosis not present

## 2012-12-29 DIAGNOSIS — E785 Hyperlipidemia, unspecified: Secondary | ICD-10-CM | POA: Diagnosis not present

## 2012-12-29 DIAGNOSIS — T887XXA Unspecified adverse effect of drug or medicament, initial encounter: Secondary | ICD-10-CM | POA: Diagnosis not present

## 2012-12-29 DIAGNOSIS — I10 Essential (primary) hypertension: Secondary | ICD-10-CM | POA: Diagnosis not present

## 2012-12-29 DIAGNOSIS — M25561 Pain in right knee: Secondary | ICD-10-CM

## 2012-12-29 LAB — COMPREHENSIVE METABOLIC PANEL
ALT: 73 U/L — ABNORMAL HIGH (ref 0–53)
AST: 43 U/L — ABNORMAL HIGH (ref 0–37)
Albumin: 4.1 g/dL (ref 3.5–5.2)
CO2: 27 mEq/L (ref 19–32)
Calcium: 9 mg/dL (ref 8.4–10.5)
Chloride: 103 mEq/L (ref 96–112)
GFR: 73.71 mL/min (ref >60.00)
Potassium: 3.5 mEq/L (ref 3.5–5.1)
Sodium: 139 mEq/L (ref 135–145)
Total Protein: 7.6 g/dL (ref 6.0–8.3)

## 2012-12-29 LAB — LIPID PANEL
HDL: 34.9 mg/dL — ABNORMAL LOW (ref >39.00)
Total CHOL/HDL Ratio: 5

## 2012-12-29 LAB — LDL CHOLESTEROL, DIRECT: Direct LDL: 114.2 mg/dL

## 2012-12-29 MED ORDER — METHYLPREDNISOLONE ACETATE 40 MG/ML IJ SUSP: 40.0000 mg | Freq: Once | INTRAMUSCULAR | Status: DC

## 2012-12-29 NOTE — Progress Notes (Signed)
Subjective:    Patient ID: Stephen Morrison, male    DOB: July 29, 1946, 67 y.o.   MRN: [id number]  HPI The patient is fasting for blood work for monitoring of lipid and liver  Blood pressure stable Knee pain in the right knee and has been diagnosed with meniscal tear... Has not taking any cortisone shot And has not been taking any osteobioflex  Review of Systems  Constitutional: Negative for fever and fatigue.  HENT: Negative for hearing loss, congestion, neck pain and postnasal drip.   Eyes: Negative for discharge, redness and visual disturbance.  Respiratory: Negative for cough, shortness of breath and wheezing.   Cardiovascular: Negative for leg swelling.  Gastrointestinal: Negative for abdominal pain, constipation and abdominal distention.  Genitourinary: Negative for urgency and frequency.  Musculoskeletal: Positive for joint swelling and arthralgias.  Skin: Negative for color change and rash.  Neurological: Negative for weakness and light-headedness.  Hematological: Negative for adenopathy.  Psychiatric/Behavioral: Negative for behavioral problems.       Past Medical History  Diagnosis Date  . Hyperlipidemia   . Hypertension   . PSA elevation   . Gout     History   Social History  . Marital Status: Divorced    Spouse Name: N/A    Number of Children: N/A  . Years of Education: N/A   Occupational History  . Not on file.   Social History Main Topics  . Smoking status: Former Smoker    Quit date: 08/13/1973  . Smokeless tobacco: Not on file  . Alcohol Use: Yes     Comment: OCCASIONALLY  . Drug Use: No  . Sexually Active: Yes   Other Topics Concern  . Not on file   Social History Narrative  . No narrative on file    Past Surgical History  Procedure Laterality Date  . Vasectomy      Family History  Problem Relation Age of Onset  . Alcohol abuse Father   . Dementia Father     Allergies  Allergen Reactions  . Amoxicillin Rash and Other (See Comments)     Tongue swells    Current Outpatient Prescriptions on File Prior to Visit  Medication Sig Dispense Refill  . amLODipine-benazepril (LOTREL) 10-40 MG per capsule TAKE 1 CAPSULE DAILY  90 capsule  1  . aspirin 81 MG tablet Take 81 mg by mouth daily.        . cetirizine (ZYRTEC) 10 MG tablet Take 10 mg by mouth daily.        . chlorzoxazone (PARAFON) 500 MG tablet TAKE 1 TABLET TWICE A DAY AS NEEDED FOR MUSCLE SPASMS  30 tablet  1  . colchicine 0.6 MG tablet Take 0.6 mg by mouth 2 (two) times daily as needed.        . diclofenac (VOLTAREN) 75 MG EC tablet Take 75 mg by mouth 2 (two) times daily as needed.      . lansoprazole (PREVACID) 15 MG capsule Take 15 mg by mouth daily as needed.        Marland Kitchen levothyroxine (SYNTHROID, LEVOTHROID) 25 MCG tablet TAKE 1 TABLET DAILY  90 tablet  0  . tadalafil (CIALIS) 20 MG tablet Take 20 mg by mouth daily as needed.        Marland Kitchen VYTORIN 10-40 MG per tablet TAKE 1 TABLET AS DIRECTED ON MONDAY, WEDNESDAY, AND FRIDAY NIGHTS  90 tablet  2   No current facility-administered medications on file prior to visit.    BP 140/80  Pulse 72  Temp(Src) 98.1 F (36.7 C)  Resp 16  Ht 5\' 11"  (1.803 m)  Wt 220 lb (99.791 kg)  BMI 30.7 kg/m2    Objective:   Physical Exam  Nursing note and vitals reviewed. Constitutional: He appears well-developed and well-nourished.  HENT:  Head: Normocephalic and atraumatic.  Eyes: Conjunctivae are normal. Pupils are equal, round, and reactive to light.  Neck: Normal range of motion. Neck supple.  Cardiovascular: Normal rate and regular rhythm.   Pulmonary/Chest: Effort normal and breath sounds normal.  Abdominal: Soft. Bowel sounds are normal.  Musculoskeletal: He exhibits edema and tenderness.          Assessment & Plan:   Informed consent obtained and the patient's right knee was prepped with betadine. Local anesthesia was obtained with topical spray. Then 40 mg of Depo-Medrol and 1/2 cc of lidocaine was injected into  the joint space. The patient tolerated the procedure without complications. Post injection care discussed with patient. Monitoring today for her liver and renal function for hypertensive and lipid management.  A lipid panel also be obtained.  Special attention will be given to liver functions given his history.  Also the patient has cartilage/meniscal tear to the right knee we have recommended Osteo Bi-Flex and a cortisone injection

## 2013-01-12 ENCOUNTER — Encounter: Payer: Self-pay | Admitting: Internal Medicine

## 2013-01-12 ENCOUNTER — Telehealth: Payer: Self-pay | Admitting: *Deleted

## 2013-01-12 NOTE — Telephone Encounter (Signed)
done

## 2013-02-04 ENCOUNTER — Other Ambulatory Visit: Payer: Self-pay | Admitting: Internal Medicine

## 2013-05-11 ENCOUNTER — Other Ambulatory Visit: Payer: Self-pay | Admitting: Internal Medicine

## 2013-06-01 ENCOUNTER — Encounter: Payer: Self-pay | Admitting: Internal Medicine

## 2013-06-01 ENCOUNTER — Ambulatory Visit (INDEPENDENT_AMBULATORY_CARE_PROVIDER_SITE_OTHER): Payer: Medicare Other | Admitting: Internal Medicine

## 2013-06-01 ENCOUNTER — Ambulatory Visit: Payer: Medicare Other | Admitting: Internal Medicine

## 2013-06-01 VITALS — BP 144/80 | HR 72 | Temp 98.2°F | Resp 16 | Ht 71.0 in | Wt 220.0 lb

## 2013-06-01 DIAGNOSIS — E039 Hypothyroidism, unspecified: Secondary | ICD-10-CM | POA: Diagnosis not present

## 2013-06-01 DIAGNOSIS — Z23 Encounter for immunization: Secondary | ICD-10-CM

## 2013-06-01 DIAGNOSIS — IMO0002 Reserved for concepts with insufficient information to code with codable children: Secondary | ICD-10-CM | POA: Diagnosis not present

## 2013-06-01 DIAGNOSIS — I10 Essential (primary) hypertension: Secondary | ICD-10-CM | POA: Diagnosis not present

## 2013-06-01 DIAGNOSIS — E785 Hyperlipidemia, unspecified: Secondary | ICD-10-CM

## 2013-06-01 LAB — LIPID PANEL
Cholesterol: 187 mg/dL (ref 0–200)
HDL: 39.6 mg/dL (ref >39.00)
VLDL: 42.6 mg/dL — ABNORMAL HIGH (ref 0.0–40.0)

## 2013-06-01 LAB — COMPREHENSIVE METABOLIC PANEL
ALT: 74 U/L — ABNORMAL HIGH (ref 0–53)
CO2: 29 mEq/L (ref 19–32)
Creatinine, Ser: 1 mg/dL (ref 0.4–1.5)
GFR: 83.54 mL/min (ref >60.00)
Glucose, Bld: 94 mg/dL (ref 70–99)
Total Bilirubin: 0.9 mg/dL (ref 0.3–1.2)

## 2013-06-01 NOTE — Patient Instructions (Signed)
The patient is instructed to continue all medications as prescribed. Schedule followup with check out clerk upon leaving the clinic  

## 2013-06-01 NOTE — Progress Notes (Signed)
Subjective:    Patient ID: Stephen Morrison, male    DOB: [birth year], 67 y.o.   MRN: [id number]  HPI Follow up for blood pressure and thyroid replacement Also is treated with statin for lipids  Seeing orthopedist for torn meniscus ( shot helped for a while) Right knee  Review of Systems  Constitutional: Negative for fever and fatigue.  HENT: Negative for congestion, hearing loss and postnasal drip.   Eyes: Negative for discharge, redness and visual disturbance.  Respiratory: Negative for cough, shortness of breath and wheezing.   Cardiovascular: Negative for leg swelling.  Gastrointestinal: Negative for abdominal pain, constipation and abdominal distention.  Genitourinary: Negative for urgency and frequency.  Musculoskeletal: Negative for arthralgias, joint swelling and neck pain.  Skin: Negative for color change and rash.  Neurological: Negative for weakness and light-headedness.  Hematological: Negative for adenopathy.  Psychiatric/Behavioral: Negative for behavioral problems.   Past Medical History  Diagnosis Date  . Hyperlipidemia   . Hypertension   . PSA elevation   . Gout     History   Social History  . Marital Status: Divorced    Spouse Name: N/A    Number of Children: N/A  . Years of Education: N/A   Occupational History  . Not on file.   Social History Main Topics  . Smoking status: Former Smoker    Quit date: 08/13/1973  . Smokeless tobacco: Not on file  . Alcohol Use: Yes     Comment: OCCASIONALLY  . Drug Use: No  . Sexual Activity: Yes   Other Topics Concern  . Not on file   Social History Narrative  . No narrative on file    Past Surgical History  Procedure Laterality Date  . Vasectomy      Family History  Problem Relation Age of Onset  . Alcohol abuse Father   . Dementia Father     Allergies  Allergen Reactions  . Amoxicillin Rash and Other (See Comments)    Tongue swells    Current Outpatient Prescriptions on File Prior to Visit   Medication Sig Dispense Refill  . amLODipine-benazepril (LOTREL) 10-40 MG per capsule TAKE 1 CAPSULE DAILY  90 capsule  3  . aspirin 81 MG tablet Take 81 mg by mouth daily.        . cetirizine (ZYRTEC) 10 MG tablet Take 10 mg by mouth daily.        . chlorzoxazone (PARAFON) 500 MG tablet TAKE 1 TABLET TWICE A DAY AS NEEDED FOR MUSCLE SPASMS  30 tablet  1  . colchicine 0.6 MG tablet Take 0.6 mg by mouth 2 (two) times daily as needed.        . diclofenac (VOLTAREN) 75 MG EC tablet Take 75 mg by mouth 2 (two) times daily as needed.      . lansoprazole (PREVACID) 15 MG capsule Take 15 mg by mouth daily as needed.        Marland Kitchen levothyroxine (SYNTHROID, LEVOTHROID) 25 MCG tablet TAKE 1 TABLET DAILY  90 tablet  3  . tadalafil (CIALIS) 20 MG tablet Take 20 mg by mouth daily as needed.        Marland Kitchen VYTORIN 10-40 MG per tablet TAKE 1 TABLET AS DIRECTED ON MONDAY, WEDNESDAY, AND FRIDAY NIGHTS  90 tablet  1   No current facility-administered medications on file prior to visit.    BP 144/80  Pulse 72  Temp(Src) 98.2 F (36.8 C)  Resp 16  Ht 5\' 11"  (1.803 m)  Wt 220 lb (99.791 kg)  BMI 30.7 kg/m2        Objective:   Physical Exam  Constitutional: He appears well-developed and well-nourished.  HENT:  Head: Normocephalic and atraumatic.  Eyes: Conjunctivae are normal. Pupils are equal, round, and reactive to light.  Neck: Normal range of motion. Neck supple.  Cardiovascular: Normal rate and regular rhythm.   Pulmonary/Chest: Effort normal and breath sounds normal.  Abdominal: Soft. Bowel sounds are normal.  Musculoskeletal: He exhibits edema and tenderness.          Assessment & Plan:  HTN stable Lipid due management measurement  Thyroid replacement Check  tsh and t4 free Labs ordered

## 2013-06-19 ENCOUNTER — Other Ambulatory Visit: Payer: Self-pay | Admitting: Internal Medicine

## 2013-10-01 DIAGNOSIS — H524 Presbyopia: Secondary | ICD-10-CM | POA: Diagnosis not present

## 2013-10-01 DIAGNOSIS — H40019 Open angle with borderline findings, low risk, unspecified eye: Secondary | ICD-10-CM | POA: Diagnosis not present

## 2013-10-01 DIAGNOSIS — H2510 Age-related nuclear cataract, unspecified eye: Secondary | ICD-10-CM | POA: Diagnosis not present

## 2013-11-30 ENCOUNTER — Telehealth: Payer: Self-pay | Admitting: Internal Medicine

## 2013-11-30 ENCOUNTER — Other Ambulatory Visit: Payer: Self-pay

## 2013-11-30 DIAGNOSIS — I10 Essential (primary) hypertension: Secondary | ICD-10-CM

## 2013-11-30 DIAGNOSIS — Z125 Encounter for screening for malignant neoplasm of prostate: Secondary | ICD-10-CM

## 2013-11-30 DIAGNOSIS — K760 Fatty (change of) liver, not elsewhere classified: Secondary | ICD-10-CM

## 2013-11-30 DIAGNOSIS — E039 Hypothyroidism, unspecified: Secondary | ICD-10-CM

## 2013-11-30 DIAGNOSIS — R972 Elevated prostate specific antigen [PSA]: Secondary | ICD-10-CM

## 2013-11-30 DIAGNOSIS — D649 Anemia, unspecified: Secondary | ICD-10-CM

## 2013-11-30 DIAGNOSIS — E785 Hyperlipidemia, unspecified: Secondary | ICD-10-CM

## 2013-11-30 NOTE — Telephone Encounter (Signed)
Labs have been put in and pt has been notified

## 2013-11-30 NOTE — Telephone Encounter (Signed)
Pt has resc cpe and now will come in prior to appt for cpe labs.  Can you put the orders in? Pt was going to come in fasting, but we had to change this. Thanks!@

## 2013-12-01 ENCOUNTER — Telehealth: Payer: Self-pay | Admitting: Internal Medicine

## 2013-12-01 NOTE — Telephone Encounter (Signed)
Relevant patient education assigned to patient using Emmi. ° °

## 2013-12-02 ENCOUNTER — Other Ambulatory Visit (INDEPENDENT_AMBULATORY_CARE_PROVIDER_SITE_OTHER): Payer: Medicare Other

## 2013-12-02 ENCOUNTER — Encounter: Payer: Medicare Other | Admitting: Internal Medicine

## 2013-12-02 DIAGNOSIS — R972 Elevated prostate specific antigen [PSA]: Secondary | ICD-10-CM | POA: Diagnosis not present

## 2013-12-02 DIAGNOSIS — E785 Hyperlipidemia, unspecified: Secondary | ICD-10-CM | POA: Diagnosis not present

## 2013-12-02 DIAGNOSIS — E039 Hypothyroidism, unspecified: Secondary | ICD-10-CM | POA: Diagnosis not present

## 2013-12-02 DIAGNOSIS — I10 Essential (primary) hypertension: Secondary | ICD-10-CM | POA: Diagnosis not present

## 2013-12-02 DIAGNOSIS — K760 Fatty (change of) liver, not elsewhere classified: Secondary | ICD-10-CM

## 2013-12-02 DIAGNOSIS — K7689 Other specified diseases of liver: Secondary | ICD-10-CM

## 2013-12-02 DIAGNOSIS — D649 Anemia, unspecified: Secondary | ICD-10-CM | POA: Diagnosis not present

## 2013-12-02 LAB — HEPATIC FUNCTION PANEL
ALT: 71 U/L — AB (ref 0–53)
AST: 39 U/L — ABNORMAL HIGH (ref 0–37)
Albumin: 4.2 g/dL (ref 3.5–5.2)
Alkaline Phosphatase: 64 U/L (ref 39–117)
Bilirubin, Direct: 0.2 mg/dL (ref 0.0–0.3)
Total Bilirubin: 1.2 mg/dL (ref 0.3–1.2)
Total Protein: 7.3 g/dL (ref 6.0–8.3)

## 2013-12-02 LAB — BASIC METABOLIC PANEL
BUN: 14 mg/dL (ref 6–23)
CHLORIDE: 103 meq/L (ref 96–112)
CO2: 29 mEq/L (ref 19–32)
Calcium: 9.8 mg/dL (ref 8.4–10.5)
Creatinine, Ser: 1 mg/dL (ref 0.4–1.5)
GFR: 83.42 mL/min (ref >60.00)
GLUCOSE: 103 mg/dL — AB (ref 70–99)
POTASSIUM: 4.7 meq/L (ref 3.5–5.1)
Sodium: 141 mEq/L (ref 135–145)

## 2013-12-02 LAB — CBC
HEMATOCRIT: 50.6 % (ref 39.0–52.0)
HEMOGLOBIN: 17.1 g/dL — AB (ref 13.0–17.0)
MCHC: 33.8 g/dL (ref 30.0–36.0)
MCV: 90.2 fl (ref 78.0–100.0)
Platelets: 358 10*3/uL (ref 150.0–400.0)
RBC: 5.61 Mil/uL (ref 4.22–5.81)
RDW: 12.9 % (ref 11.5–14.6)
WBC: 6.6 10*3/uL (ref 4.5–10.5)

## 2013-12-02 LAB — POCT URINALYSIS DIPSTICK
Bilirubin, UA: NEGATIVE
Glucose, UA: NEGATIVE
Ketones, UA: NEGATIVE
Leukocytes, UA: NEGATIVE
Nitrite, UA: NEGATIVE
PH UA: 7
PROTEIN UA: NEGATIVE
RBC UA: NEGATIVE
SPEC GRAV UA: 1.015
Urobilinogen, UA: 0.2

## 2013-12-02 LAB — LIPID PANEL
Cholesterol: 170 mg/dL (ref 0–200)
HDL: 37 mg/dL — ABNORMAL LOW (ref >39.00)
LDL Cholesterol: 105 mg/dL — ABNORMAL HIGH (ref 0–99)
Total CHOL/HDL Ratio: 5
Triglycerides: 139 mg/dL (ref 0.0–149.0)
VLDL: 27.8 mg/dL (ref 0.0–40.0)

## 2013-12-02 LAB — TSH: TSH: 4.85 u[IU]/mL (ref 0.35–5.50)

## 2013-12-02 LAB — PSA: PSA: 2.38 ng/mL (ref 0.10–4.00)

## 2013-12-11 ENCOUNTER — Ambulatory Visit (INDEPENDENT_AMBULATORY_CARE_PROVIDER_SITE_OTHER): Payer: Medicare Other | Admitting: Internal Medicine

## 2013-12-11 ENCOUNTER — Encounter: Payer: Self-pay | Admitting: Internal Medicine

## 2013-12-11 VITALS — BP 120/80 | HR 71 | Temp 98.0°F | Wt 220.0 lb

## 2013-12-11 DIAGNOSIS — G2581 Restless legs syndrome: Secondary | ICD-10-CM

## 2013-12-11 DIAGNOSIS — I10 Essential (primary) hypertension: Secondary | ICD-10-CM

## 2013-12-11 DIAGNOSIS — D45 Polycythemia vera: Secondary | ICD-10-CM | POA: Diagnosis not present

## 2013-12-11 DIAGNOSIS — D751 Secondary polycythemia: Secondary | ICD-10-CM

## 2013-12-11 DIAGNOSIS — Z0000 Encounter for general adult medical examination without abnormal findings: Secondary | ICD-10-CM | POA: Diagnosis not present

## 2013-12-11 MED ORDER — GABAPENTIN 300 MG PO CAPS: 300.0000 mg | ORAL_CAPSULE | Freq: Every day | ORAL | Status: DC

## 2013-12-11 MED ORDER — SILDENAFIL CITRATE 20 MG PO TABS: 40.0000 mg | ORAL_TABLET | ORAL | Status: DC | PRN

## 2013-12-11 NOTE — Progress Notes (Signed)
Pre visit review using our clinic review tool, if applicable. No additional management support is needed unless otherwise documented below in the visit note. 

## 2013-12-11 NOTE — Patient Instructions (Signed)
The patient is instructed to continue all medications as prescribed. Schedule followup with check out clerk upon leaving the clinic  

## 2013-12-11 NOTE — Progress Notes (Signed)
Subjective:    Patient ID: Stephen Morrison, male    DOB: 25-Feb-1946, 68 y.o.   MRN: [id number]  HPI Follow up  And CPX Mild elevations of LFT and elevated Hgb Screened in the past for hemachomatosis?   Review of Systems  Constitutional: Negative for fever and fatigue.  HENT: Negative for congestion, hearing loss and postnasal drip.   Eyes: Negative for discharge, redness and visual disturbance.  Respiratory: Negative for cough, shortness of breath and wheezing.   Cardiovascular: Negative for leg swelling.  Gastrointestinal: Negative for abdominal pain, constipation and abdominal distention.  Genitourinary: Negative for urgency and frequency.  Musculoskeletal: Positive for joint swelling. Negative for arthralgias and neck pain.  Skin: Negative for color change and rash.  Neurological: Negative for weakness and light-headedness.  Hematological: Negative for adenopathy.  Psychiatric/Behavioral: Negative for behavioral problems.   Past Medical History  Diagnosis Date  . Hyperlipidemia   . Hypertension   . PSA elevation   . Gout     History   Social History  . Marital Status: Divorced    Spouse Name: N/A    Number of Children: N/A  . Years of Education: N/A   Occupational History  . Not on file.   Social History Main Topics  . Smoking status: Former Smoker    Quit date: 08/13/1973  . Smokeless tobacco: Not on file  . Alcohol Use: Yes     Comment: OCCASIONALLY  . Drug Use: No  . Sexual Activity: Yes   Other Topics Concern  . Not on file   Social History Narrative  . No narrative on file    Past Surgical History  Procedure Laterality Date  . Vasectomy      Family History  Problem Relation Age of Onset  . Alcohol abuse Father   . Dementia Father     Allergies  Allergen Reactions  . Amoxicillin Rash and Other (See Comments)    Tongue swells    Current Outpatient Prescriptions on File Prior to Visit  Medication Sig Dispense Refill  .  amLODipine-benazepril (LOTREL) 10-40 MG per capsule TAKE 1 CAPSULE DAILY  90 capsule  3  . aspirin 81 MG tablet Take 81 mg by mouth daily.        . cetirizine (ZYRTEC) 10 MG tablet Take 10 mg by mouth daily.        . diclofenac (VOLTAREN) 75 MG EC tablet Take 75 mg by mouth 2 (two) times daily as needed.      . lansoprazole (PREVACID) 15 MG capsule Take 15 mg by mouth daily as needed.        Marland Kitchen levothyroxine (SYNTHROID, LEVOTHROID) 25 MCG tablet TAKE 1 TABLET DAILY  90 tablet  3  . tadalafil (CIALIS) 20 MG tablet Take 20 mg by mouth daily as needed.        Marland Kitchen VYTORIN 10-40 MG per tablet TAKE 1 TABLET AS DIRECTED ON MONDAY, WEDNESDAY, AND FRIDAY NIGHTS  90 tablet  1  . chlorzoxazone (PARAFON) 500 MG tablet TAKE 1 TABLET TWICE A DAY AS NEEDED FOR MUSCLE SPASMS  30 tablet  0  . colchicine 0.6 MG tablet Take 0.6 mg by mouth 2 (two) times daily as needed.         No current facility-administered medications on file prior to visit.    BP 120/80  Pulse 71  Temp(Src) 98 F (36.7 C) (Oral)  Wt 220 lb (99.791 kg)  SpO2 98%       Objective:  Physical Exam  Nursing note and vitals reviewed. Constitutional: He appears well-developed and well-nourished.  HENT:  Head: Normocephalic and atraumatic.  Eyes: Conjunctivae are normal. Pupils are equal, round, and reactive to light.  Neck: Normal range of motion. Neck supple.  Cardiovascular: Normal rate and regular rhythm.   Murmur heard. Pulmonary/Chest: Effort normal and breath sounds normal.  Abdominal: Soft. Bowel sounds are normal.  Musculoskeletal: He exhibits edema.          Assessment & Plan:  EKG normal Patient presents for yearly preventative medicine examination. Medicare questionnaire was completed  All immunizations and health maintenance protocols were reviewed with the patient and needed orders were placed.  Appropriate screening laboratory values were ordered for the patient including screening of hyperlipidemia, renal  function and hepatic function. If indicated by BPH, a PSA was ordered.  Medication reconciliation,  past medical history, social history, problem list and allergies were reviewed in detail with the patient  Goals were established with regard to weight loss, exercise, and  diet in compliance with medications  End of life planning was discussed.  Screening for iron overload or hemachomatosis  neurotin for restless legs and back pain at night  Possible statineffect on LFTs but needs to be on statin due to CAD risk high

## 2013-12-12 LAB — IRON AND TIBC
%SAT: 25 % (ref 20–55)
Iron: 90 ug/dL (ref 42–165)
TIBC: 354 ug/dL (ref 215–435)
UIBC: 264 ug/dL (ref 125–400)

## 2013-12-16 LAB — HEMOCHROMATOSIS DNA-PCR(C282Y,H63D)

## 2013-12-24 ENCOUNTER — Encounter: Payer: Self-pay | Admitting: Internal Medicine

## 2013-12-24 ENCOUNTER — Ambulatory Visit (INDEPENDENT_AMBULATORY_CARE_PROVIDER_SITE_OTHER): Payer: Medicare Other | Admitting: Internal Medicine

## 2013-12-24 VITALS — BP 156/86 | HR 80 | Temp 98.9°F | Ht 71.0 in | Wt 214.0 lb

## 2013-12-24 DIAGNOSIS — J329 Chronic sinusitis, unspecified: Secondary | ICD-10-CM

## 2013-12-24 DIAGNOSIS — J069 Acute upper respiratory infection, unspecified: Secondary | ICD-10-CM | POA: Diagnosis not present

## 2013-12-24 MED ORDER — DOXYCYCLINE HYCLATE 100 MG PO CAPS: 100.0000 mg | ORAL_CAPSULE | Freq: Two times a day (BID) | ORAL | Status: DC

## 2013-12-24 NOTE — Patient Instructions (Addendum)
This acts like a viral.  respiratory infection but could have allergy congestion also .   Cough med just for comfort .  Not diagnostic .  mucinex hot tea  . At night  Saline nose spray  Add nasal cortisone    flonase or nasacort Every day to try  For at least a week .can use lnger for congestion Can add antibiotic  If not getting better a week into illness or face pain getting worse etc.      Sinusitis Sinusitis is redness, soreness, and swelling (inflammation) of the paranasal sinuses. Paranasal sinuses are air pockets within the bones of your face (beneath the eyes, the middle of the forehead, or above the eyes). In healthy paranasal sinuses, mucus is able to drain out, and air is able to circulate through them by way of your nose. However, when your paranasal sinuses are inflamed, mucus and air can become trapped. This can allow bacteria and other germs to grow and cause infection. Sinusitis can develop quickly and last only a short time (acute) or continue over a long period (chronic). Sinusitis that lasts for more than 12 weeks is considered chronic.  CAUSES  Causes of sinusitis include:  Allergies.  Structural abnormalities, such as displacement of the cartilage that separates your nostrils (deviated septum), which can decrease the air flow through your nose and sinuses and affect sinus drainage.  Functional abnormalities, such as when the small hairs (cilia) that line your sinuses and help remove mucus do not work properly or are not present. SYMPTOMS  Symptoms of acute and chronic sinusitis are the same. The primary symptoms are pain and pressure around the affected sinuses. Other symptoms include:  Upper toothache.  Earache.  Headache.  Bad breath.  Decreased sense of smell and taste.  A cough, which worsens when you are lying flat.  Fatigue.  Fever.  Thick drainage from your nose, which often is green and may contain pus (purulent).  Swelling and warmth over the  affected sinuses. DIAGNOSIS  Your caregiver will perform a physical exam. During the exam, your caregiver may:  Look in your nose for signs of abnormal growths in your nostrils (nasal polyps).  Tap over the affected sinus to check for signs of infection.  View the inside of your sinuses (endoscopy) with a special imaging device with a light attached (endoscope), which is inserted into your sinuses. If your caregiver suspects that you have chronic sinusitis, one or more of the following tests may be recommended:  Allergy tests.  Nasal culture A sample of mucus is taken from your nose and sent to a lab and screened for bacteria.  Nasal cytology A sample of mucus is taken from your nose and examined by your caregiver to determine if your sinusitis is related to an allergy. TREATMENT  Most cases of acute sinusitis are related to a viral infection and will resolve on their own within 10 days. Sometimes medicines are prescribed to help relieve symptoms (pain medicine, decongestants, nasal steroid sprays, or saline sprays).  However, for sinusitis related to a bacterial infection, your caregiver will prescribe antibiotic medicines. These are medicines that will help kill the bacteria causing the infection.  Rarely, sinusitis is caused by a fungal infection. In theses cases, your caregiver will prescribe antifungal medicine. For some cases of chronic sinusitis, surgery is needed. Generally, these are cases in which sinusitis recurs more than 3 times per year, despite other treatments. HOME CARE INSTRUCTIONS   Drink plenty of water. Water  helps thin the mucus so your sinuses can drain more easily.  Use a humidifier.  Inhale steam 3 to 4 times a day (for example, sit in the bathroom with the shower running).  Apply a warm, moist washcloth to your face 3 to 4 times a day, or as directed by your caregiver.  Use saline nasal sprays to help moisten and clean your sinuses.  Take over-the-counter or  prescription medicines for pain, discomfort, or fever only as directed by your caregiver. SEEK IMMEDIATE MEDICAL CARE IF:  You have increasing pain or severe headaches.  You have nausea, vomiting, or drowsiness.  You have swelling around your face.  You have vision problems.  You have a stiff neck.  You have difficulty breathing. MAKE SURE YOU:   Understand these instructions.  Will watch your condition.  Will get help right away if you are not doing well or get worse. Document Released: 07/30/2005 Document Revised: 10/22/2011 Document Reviewed: 08/14/2011 [facility] Patient Information 2014 [place], [place].  Upper Respiratory Infection, Adult An upper respiratory infection (URI) is also sometimes known as the common cold. The upper respiratory tract includes the nose, sinuses, throat, trachea, and bronchi. Bronchi are the airways leading to the lungs. Most people improve within 1 week, but symptoms can last up to 2 weeks. A residual cough may last even longer.  CAUSES Many different viruses can infect the tissues lining the upper respiratory tract. The tissues become irritated and inflamed and often become very moist. Mucus production is also common. A cold is contagious. You can easily spread the virus to others by oral contact. This includes kissing, sharing a glass, coughing, or sneezing. Touching your mouth or nose and then touching a surface, which is then touched by another person, can also spread the virus. SYMPTOMS  Symptoms typically develop 1 to 3 days after you come in contact with a cold virus. Symptoms vary from person to person. They may include:  Runny nose.  Sneezing.  Nasal congestion.  Sinus irritation.  Sore throat.  Loss of voice (laryngitis).  Cough.  Fatigue.  Muscle aches.  Loss of appetite.  Headache.  Low-grade fever. DIAGNOSIS  You might diagnose your own cold based on familiar symptoms, since most people get a cold 2 to 3 times a year.  Your caregiver can confirm this based on your exam. Most importantly, your caregiver can check that your symptoms are not due to another disease such as strep throat, sinusitis, pneumonia, asthma, or epiglottitis. Blood tests, throat tests, and X-rays are not necessary to diagnose a common cold, but they may sometimes be helpful in excluding other more serious diseases. Your caregiver will decide if any further tests are required. RISKS AND COMPLICATIONS  You may be at risk for a more severe case of the common cold if you smoke cigarettes, have chronic heart disease (such as heart failure) or lung disease (such as asthma), or if you have a weakened immune system. The very young and very old are also at risk for more serious infections. Bacterial sinusitis, middle ear infections, and bacterial pneumonia can complicate the common cold. The common cold can worsen asthma and chronic obstructive pulmonary disease (COPD). Sometimes, these complications can require emergency medical care and may be life-threatening. PREVENTION  The best way to protect against getting a cold is to practice good hygiene. Avoid oral or hand contact with people with cold symptoms. Wash your hands often if contact occurs. There is no clear evidence that vitamin C, vitamin E,  echinacea, or exercise reduces the chance of developing a cold. However, it is always recommended to get plenty of rest and practice good nutrition. TREATMENT  Treatment is directed at relieving symptoms. There is no cure. Antibiotics are not effective, because the infection is caused by a virus, not by bacteria. Treatment may include:  Increased fluid intake. Sports drinks offer valuable electrolytes, sugars, and fluids.  Breathing heated mist or steam (vaporizer or shower).  Eating chicken soup or other clear broths, and maintaining good nutrition.  Getting plenty of rest.  Using gargles or lozenges for comfort.  Controlling fevers with ibuprofen or  acetaminophen as directed by your caregiver.  Increasing usage of your inhaler if you have asthma. Zinc gel and zinc lozenges, taken in the first 24 hours of the common cold, can shorten the duration and lessen the severity of symptoms. Pain medicines may help with fever, muscle aches, and throat pain. A variety of non-prescription medicines are available to treat congestion and runny nose. Your caregiver can make recommendations and may suggest nasal or lung inhalers for other symptoms.  HOME CARE INSTRUCTIONS   Only take over-the-counter or prescription medicines for pain, discomfort, or fever as directed by your caregiver.  Use a warm mist humidifier or inhale steam from a shower to increase air moisture. This may keep secretions moist and make it easier to breathe.  Drink enough water and fluids to keep your urine clear or pale yellow.  Rest as needed.  Return to work when your temperature has returned to normal or as your caregiver advises. You may need to stay home longer to avoid infecting others. You can also use a face mask and careful hand washing to prevent spread of the virus. SEEK MEDICAL CARE IF:   After the first few days, you feel you are getting worse rather than better.  You need your caregiver's advice about medicines to control symptoms.  You develop chills, worsening shortness of breath, or brown or red sputum. These may be signs of pneumonia.  You develop yellow or brown nasal discharge or pain in the face, especially when you bend forward. These may be signs of sinusitis.  You develop a fever, swollen neck glands, pain with swallowing, or white areas in the back of your throat. These may be signs of strep throat. SEEK IMMEDIATE MEDICAL CARE IF:   You have a fever.  You develop severe or persistent headache, ear pain, sinus pain, or chest pain.  You develop wheezing, a prolonged cough, cough up blood, or have a change in your usual mucus (if you have chronic lung  disease).  You develop sore muscles or a stiff neck. Document Released: 01/23/2001 Document Revised: 10/22/2011 Document Reviewed: 12/01/2010 [facility] Patient Information 2014 [place], [place].

## 2013-12-24 NOTE — Progress Notes (Signed)
Pre visit review using our clinic review tool, if applicable. No additional management support is needed unless otherwise documented below in the visit note.   Chief Complaint  Patient presents with  . Nasal Congestion    Nasal Congestion is green.  . Cough  . Sore Throat  . Headache    HPI: Patient comes in today for SDA for  new problem evaluation. Onset  About 3 days ago.  After mowing lawn no itching but very congested  A lot like in past when hwa given amox and got rash and then got better with levaquin.  No resp disease  [place] Native  Eyes usually red after shower.  Using otcs  No colds and no sinus infection  For 2 years.   But can get them this time of year . Upper chest and drainage and very stuffy .  ROS: See pertinent positives and negatives per HPI.no cp sob ? If had feverish feeling last night  LBP whe gets like this  Not new no hemptysis   Past Medical History  Diagnosis Date  . Hyperlipidemia   . Hypertension   . PSA elevation   . Gout     Family History  Problem Relation Age of Onset  . Alcohol abuse Father   . Dementia Father     History   Social History  . Marital Status: Divorced    Spouse Name: N/A    Number of Children: N/A  . Years of Education: N/A   Social History Main Topics  . Smoking status: Former Smoker    Quit date: 08/13/1973  . Smokeless tobacco: None  . Alcohol Use: Yes     Comment: OCCASIONALLY  . Drug Use: No  . Sexual Activity: Yes   Other Topics Concern  . None   Social History Narrative  . None    Outpatient Encounter Prescriptions as of 12/24/2013  Medication Sig  . amLODipine-benazepril (LOTREL) 10-40 MG per capsule TAKE 1 CAPSULE DAILY  . aspirin 81 MG tablet Take 81 mg by mouth daily.    . cetirizine (ZYRTEC) 10 MG tablet Take 10 mg by mouth daily.    . diclofenac (VOLTAREN) 75 MG EC tablet Take 75 mg by mouth 2 (two) times daily as needed.  . gabapentin (NEURONTIN) 300 MG capsule Take 1 capsule (300 mg total) by  mouth at bedtime.  . lansoprazole (PREVACID) 15 MG capsule Take 15 mg by mouth daily as needed.    Marland Kitchen levothyroxine (SYNTHROID, LEVOTHROID) 25 MCG tablet TAKE 1 TABLET DAILY  . sildenafil (REVATIO) 20 MG tablet Take 2 tablets (40 mg total) by mouth as needed.  Marland Kitchen VYTORIN 10-40 MG per tablet TAKE 1 TABLET AS DIRECTED ON MONDAY, WEDNESDAY, AND FRIDAY NIGHTS  . chlorzoxazone (PARAFON) 500 MG tablet TAKE 1 TABLET TWICE A DAY AS NEEDED FOR MUSCLE SPASMS  . colchicine 0.6 MG tablet Take 0.6 mg by mouth 2 (two) times daily as needed.    . doxycycline (VIBRAMYCIN) 100 MG capsule Take 1 capsule (100 mg total) by mouth 2 (two) times daily.  . [DISCONTINUED] tadalafil (CIALIS) 20 MG tablet Take 20 mg by mouth daily as needed.      EXAM:  BP 156/86  Pulse 80  Temp(Src) 98.9 F (37.2 C) (Oral)  Ht 5\' 11"  (1.803 m)  Wt 214 lb (97.07 kg)  BMI 29.86 kg/m2  SpO2 96%  Body mass index is 29.86 kg/(m^2). WDWN in NAD  quiet respirations; very congested  somewhat hoarse. Non toxic .  HEENT: Normocephalic ;atraumatic , Eyes;  PERRL, EOMs  Full, lids and conjunctiva clear,,Ears: no deformities, canals nl, TM landmarks normal,wax in canal not impacted  Nose: no deformity or discharge but verycongested;face non  tender Mouth : OP clear without lesion or edema .drainage tracts  Neck: Supple without adenopathy or masses or bruits Chest:  Clear to A without wheezes rales or rhonchi CV:  S1-S2 no gallops or murmurs peripheral perfusion is normal Skin :nl perfusion and no acute rashes   ASSESSMENT AND PLAN:  Discussed the following assessment and plan:  Acute URI of multiple sites  Sinusitis - viral vs allergic early  if not improwith INCS etc then can add antibiotic  No antibiotic unless getting worse or alarm features discussed -Patient advised to return or notify health care team  if symptoms worsen ,persist or new concerns arise.  Patient Instructions  This acts like a viral.  respiratory infection but  could have allergy congestion also .   Cough med just for comfort .  Not diagnostic .  mucinex hot tea  . At night  Saline nose spray  Add nasal cortisone    flonase or nasacort Every day to try  For at least a week .can use lnger for congestion Can add antibiotic  If not getting better a week into illness or face pain getting worse etc.      Sinusitis Sinusitis is redness, soreness, and swelling (inflammation) of the paranasal sinuses. Paranasal sinuses are air pockets within the bones of your face (beneath the eyes, the middle of the forehead, or above the eyes). In healthy paranasal sinuses, mucus is able to drain out, and air is able to circulate through them by way of your nose. However, when your paranasal sinuses are inflamed, mucus and air can become trapped. This can allow bacteria and other germs to grow and cause infection. Sinusitis can develop quickly and last only a short time (acute) or continue over a long period (chronic). Sinusitis that lasts for more than 12 weeks is considered chronic.  CAUSES  Causes of sinusitis include:  Allergies.  Structural abnormalities, such as displacement of the cartilage that separates your nostrils (deviated septum), which can decrease the air flow through your nose and sinuses and affect sinus drainage.  Functional abnormalities, such as when the small hairs (cilia) that line your sinuses and help remove mucus do not work properly or are not present. SYMPTOMS  Symptoms of acute and chronic sinusitis are the same. The primary symptoms are pain and pressure around the affected sinuses. Other symptoms include:  Upper toothache.  Earache.  Headache.  Bad breath.  Decreased sense of smell and taste.  A cough, which worsens when you are lying flat.  Fatigue.  Fever.  Thick drainage from your nose, which often is green and may contain pus (purulent).  Swelling and warmth over the affected sinuses. DIAGNOSIS  Your caregiver will  perform a physical exam. During the exam, your caregiver may:  Look in your nose for signs of abnormal growths in your nostrils (nasal polyps).  Tap over the affected sinus to check for signs of infection.  View the inside of your sinuses (endoscopy) with a special imaging device with a light attached (endoscope), which is inserted into your sinuses. If your caregiver suspects that you have chronic sinusitis, one or more of the following tests may be recommended:  Allergy tests.  Nasal culture A sample of mucus is taken from your nose and sent to a lab  and screened for bacteria.  Nasal cytology A sample of mucus is taken from your nose and examined by your caregiver to determine if your sinusitis is related to an allergy. TREATMENT  Most cases of acute sinusitis are related to a viral infection and will resolve on their own within 10 days. Sometimes medicines are prescribed to help relieve symptoms (pain medicine, decongestants, nasal steroid sprays, or saline sprays).  However, for sinusitis related to a bacterial infection, your caregiver will prescribe antibiotic medicines. These are medicines that will help kill the bacteria causing the infection.  Rarely, sinusitis is caused by a fungal infection. In theses cases, your caregiver will prescribe antifungal medicine. For some cases of chronic sinusitis, surgery is needed. Generally, these are cases in which sinusitis recurs more than 3 times per year, despite other treatments. HOME CARE INSTRUCTIONS   Drink plenty of water. Water helps thin the mucus so your sinuses can drain more easily.  Use a humidifier.  Inhale steam 3 to 4 times a day (for example, sit in the bathroom with the shower running).  Apply a warm, moist washcloth to your face 3 to 4 times a day, or as directed by your caregiver.  Use saline nasal sprays to help moisten and clean your sinuses.  Take over-the-counter or prescription medicines for pain, discomfort, or  fever only as directed by your caregiver. SEEK IMMEDIATE MEDICAL CARE IF:  You have increasing pain or severe headaches.  You have nausea, vomiting, or drowsiness.  You have swelling around your face.  You have vision problems.  You have a stiff neck.  You have difficulty breathing. MAKE SURE YOU:   Understand these instructions.  Will watch your condition.  Will get help right away if you are not doing well or get worse. Document Released: 07/30/2005 Document Revised: 10/22/2011 Document Reviewed: 08/14/2011 [facility] Patient Information 2014 [place], [place].  Upper Respiratory Infection, Adult An upper respiratory infection (URI) is also sometimes known as the common cold. The upper respiratory tract includes the nose, sinuses, throat, trachea, and bronchi. Bronchi are the airways leading to the lungs. Most people improve within 1 week, but symptoms can last up to 2 weeks. A residual cough may last even longer.  CAUSES Many different viruses can infect the tissues lining the upper respiratory tract. The tissues become irritated and inflamed and often become very moist. Mucus production is also common. A cold is contagious. You can easily spread the virus to others by oral contact. This includes kissing, sharing a glass, coughing, or sneezing. Touching your mouth or nose and then touching a surface, which is then touched by another person, can also spread the virus. SYMPTOMS  Symptoms typically develop 1 to 3 days after you come in contact with a cold virus. Symptoms vary from person to person. They may include:  Runny nose.  Sneezing.  Nasal congestion.  Sinus irritation.  Sore throat.  Loss of voice (laryngitis).  Cough.  Fatigue.  Muscle aches.  Loss of appetite.  Headache.  Low-grade fever. DIAGNOSIS  You might diagnose your own cold based on familiar symptoms, since most people get a cold 2 to 3 times a year. Your caregiver can confirm this based on your  exam. Most importantly, your caregiver can check that your symptoms are not due to another disease such as strep throat, sinusitis, pneumonia, asthma, or epiglottitis. Blood tests, throat tests, and X-rays are not necessary to diagnose a common cold, but they may sometimes be helpful in excluding other  more serious diseases. Your caregiver will decide if any further tests are required. RISKS AND COMPLICATIONS  You may be at risk for a more severe case of the common cold if you smoke cigarettes, have chronic heart disease (such as heart failure) or lung disease (such as asthma), or if you have a weakened immune system. The very young and very old are also at risk for more serious infections. Bacterial sinusitis, middle ear infections, and bacterial pneumonia can complicate the common cold. The common cold can worsen asthma and chronic obstructive pulmonary disease (COPD). Sometimes, these complications can require emergency medical care and may be life-threatening. PREVENTION  The best way to protect against getting a cold is to practice good hygiene. Avoid oral or hand contact with people with cold symptoms. Wash your hands often if contact occurs. There is no clear evidence that vitamin C, vitamin E, echinacea, or exercise reduces the chance of developing a cold. However, it is always recommended to get plenty of rest and practice good nutrition. TREATMENT  Treatment is directed at relieving symptoms. There is no cure. Antibiotics are not effective, because the infection is caused by a virus, not by bacteria. Treatment may include:  Increased fluid intake. Sports drinks offer valuable electrolytes, sugars, and fluids.  Breathing heated mist or steam (vaporizer or shower).  Eating chicken soup or other clear broths, and maintaining good nutrition.  Getting plenty of rest.  Using gargles or lozenges for comfort.  Controlling fevers with ibuprofen or acetaminophen as directed by your  caregiver.  Increasing usage of your inhaler if you have asthma. Zinc gel and zinc lozenges, taken in the first 24 hours of the common cold, can shorten the duration and lessen the severity of symptoms. Pain medicines may help with fever, muscle aches, and throat pain. A variety of non-prescription medicines are available to treat congestion and runny nose. Your caregiver can make recommendations and may suggest nasal or lung inhalers for other symptoms.  HOME CARE INSTRUCTIONS   Only take over-the-counter or prescription medicines for pain, discomfort, or fever as directed by your caregiver.  Use a warm mist humidifier or inhale steam from a shower to increase air moisture. This may keep secretions moist and make it easier to breathe.  Drink enough water and fluids to keep your urine clear or pale yellow.  Rest as needed.  Return to work when your temperature has returned to normal or as your caregiver advises. You may need to stay home longer to avoid infecting others. You can also use a face mask and careful hand washing to prevent spread of the virus. SEEK MEDICAL CARE IF:   After the first few days, you feel you are getting worse rather than better.  You need your caregiver's advice about medicines to control symptoms.  You develop chills, worsening shortness of breath, or brown or red sputum. These may be signs of pneumonia.  You develop yellow or brown nasal discharge or pain in the face, especially when you bend forward. These may be signs of sinusitis.  You develop a fever, swollen neck glands, pain with swallowing, or white areas in the back of your throat. These may be signs of strep throat. SEEK IMMEDIATE MEDICAL CARE IF:   You have a fever.  You develop severe or persistent headache, ear pain, sinus pain, or chest pain.  You develop wheezing, a prolonged cough, cough up blood, or have a change in your usual mucus (if you have chronic lung disease).  You  develop sore  muscles or a stiff neck. Document Released: 01/23/2001 Document Revised: 10/22/2011 Document Reviewed: 12/01/2010 [facility] Patient Information 2014 [place], [place].       Standley Brooking. Panosh M.D.

## 2014-01-25 ENCOUNTER — Encounter: Payer: Self-pay | Admitting: Internal Medicine

## 2014-01-25 DIAGNOSIS — G2581 Restless legs syndrome: Secondary | ICD-10-CM

## 2014-01-26 MED ORDER — GABAPENTIN 300 MG PO CAPS: 300.0000 mg | ORAL_CAPSULE | Freq: Every day | ORAL | Status: DC

## 2014-02-01 DIAGNOSIS — H40019 Open angle with borderline findings, low risk, unspecified eye: Secondary | ICD-10-CM | POA: Diagnosis not present

## 2014-02-08 ENCOUNTER — Other Ambulatory Visit: Payer: Self-pay | Admitting: Internal Medicine

## 2014-04-05 ENCOUNTER — Other Ambulatory Visit: Payer: Self-pay | Admitting: Internal Medicine

## 2014-04-09 ENCOUNTER — Encounter: Payer: Self-pay | Admitting: Family Medicine

## 2014-04-09 ENCOUNTER — Ambulatory Visit (INDEPENDENT_AMBULATORY_CARE_PROVIDER_SITE_OTHER): Payer: Medicare Other | Admitting: Family Medicine

## 2014-04-09 VITALS — BP 130/76 | HR 67 | Temp 98.0°F | Resp 16 | Ht 70.25 in | Wt 220.0 lb

## 2014-04-09 DIAGNOSIS — E785 Hyperlipidemia, unspecified: Secondary | ICD-10-CM | POA: Diagnosis not present

## 2014-04-09 DIAGNOSIS — I10 Essential (primary) hypertension: Secondary | ICD-10-CM | POA: Diagnosis not present

## 2014-04-09 DIAGNOSIS — M150 Primary generalized (osteo)arthritis: Secondary | ICD-10-CM

## 2014-04-09 DIAGNOSIS — E039 Hypothyroidism, unspecified: Secondary | ICD-10-CM

## 2014-04-09 DIAGNOSIS — M159 Polyosteoarthritis, unspecified: Secondary | ICD-10-CM | POA: Diagnosis not present

## 2014-04-09 DIAGNOSIS — M1990 Unspecified osteoarthritis, unspecified site: Secondary | ICD-10-CM | POA: Insufficient documentation

## 2014-04-09 LAB — CBC WITH DIFFERENTIAL/PLATELET
BASOS ABS: 0 10*3/uL (ref 0.0–0.1)
Basophils Relative: 0.6 % (ref 0.0–3.0)
Eosinophils Absolute: 0.1 10*3/uL (ref 0.0–0.7)
Eosinophils Relative: 1.7 % (ref 0.0–5.0)
HEMATOCRIT: 50.8 % (ref 39.0–52.0)
HEMOGLOBIN: 17.3 g/dL — AB (ref 13.0–17.0)
LYMPHS ABS: 1.9 10*3/uL (ref 0.7–4.0)
Lymphocytes Relative: 35.8 % (ref 12.0–46.0)
MCHC: 34 g/dL (ref 30.0–36.0)
MCV: 89.6 fl (ref 78.0–100.0)
MONO ABS: 0.7 10*3/uL (ref 0.1–1.0)
MONOS PCT: 12.8 % — AB (ref 3.0–12.0)
NEUTROS ABS: 2.7 10*3/uL (ref 1.4–7.7)
Neutrophils Relative %: 49.1 % (ref 43.0–77.0)
PLATELETS: 352 10*3/uL (ref 150.0–400.0)
RBC: 5.67 Mil/uL (ref 4.22–5.81)
RDW: 13.4 % (ref 11.5–15.5)
WBC: 5.4 10*3/uL (ref 4.0–10.5)

## 2014-04-09 LAB — HEPATIC FUNCTION PANEL
ALT: 57 U/L — ABNORMAL HIGH (ref 0–53)
AST: 40 U/L — ABNORMAL HIGH (ref 0–37)
Albumin: 4.2 g/dL (ref 3.5–5.2)
Alkaline Phosphatase: 59 U/L (ref 39–117)
BILIRUBIN DIRECT: 0 mg/dL (ref 0.0–0.3)
BILIRUBIN TOTAL: 0.9 mg/dL (ref 0.2–1.2)
Total Protein: 7.5 g/dL (ref 6.0–8.3)

## 2014-04-09 LAB — BASIC METABOLIC PANEL
BUN: 12 mg/dL (ref 6–23)
CO2: 27 meq/L (ref 19–32)
Calcium: 8.8 mg/dL (ref 8.4–10.5)
Chloride: 100 mEq/L (ref 96–112)
Creatinine, Ser: 1.1 mg/dL (ref 0.4–1.5)
GFR: 74.24 mL/min (ref >60.00)
GLUCOSE: 99 mg/dL (ref 70–99)
POTASSIUM: 3.7 meq/L (ref 3.5–5.1)
Sodium: 136 mEq/L (ref 135–145)

## 2014-04-09 LAB — LIPID PANEL
Cholesterol: 156 mg/dL (ref 0–200)
HDL: 34.2 mg/dL — ABNORMAL LOW (ref >39.00)
LDL Cholesterol: 91 mg/dL (ref 0–99)
NONHDL: 121.8
Total CHOL/HDL Ratio: 5
Triglycerides: 153 mg/dL — ABNORMAL HIGH (ref 0.0–149.0)
VLDL: 30.6 mg/dL (ref 0.0–40.0)

## 2014-04-09 LAB — TSH: TSH: 0.44 u[IU]/mL (ref 0.35–4.50)

## 2014-04-09 MED ORDER — BENAZEPRIL-HYDROCHLOROTHIAZIDE 20-12.5 MG PO TABS: 1.0000 | ORAL_TABLET | Freq: Every day | ORAL | Status: DC

## 2014-04-09 NOTE — Patient Instructions (Signed)
Follow up in 1 month to recheck BP STOP the Amlodipine combo START the Benazepril/HCTZ combo daily for blood pressure We'll notify you of your lab results and make any changes if needed Advil PM as needed for pain at night/sleep Call with any questions or concerns Welcome!  We're glad to have you!

## 2014-04-09 NOTE — Progress Notes (Signed)
Pre visit review using our clinic review tool, if applicable. No additional management support is needed unless otherwise documented below in the visit note. 

## 2014-04-09 NOTE — Progress Notes (Signed)
   Subjective:    Patient ID: Stephen Morrison, male    DOB: 1945/10/07, 68 y.o.   MRN: [id number]  HPI New to establish.  Previous MD- Arnoldo Morale  HTN- chronic problem, on Amlodipine-Benazpril daily.  No CP, SOB, HAs, visual changes, edema above baseline.  Hypothyroid- chronic problem, on Synthroid.  Recent labs show high normal TSH.  Pt on Levothyroxine 53mcg.  Denies fatigue, constipation. + dry skin.  Hyperlipidemia- chronic problem, on Vytorin.  No abd, N/V, myalgias.  Knee and back pain- worse at night, impacting sleep.  Has taken Tylenol PM.  Tried wife's sleeping pill and 'it just makes me dopey'.  Currently on gabapentin b/c previous MD felt he had RLS- 'it's not doing anything'.   Review of Systems For ROS see HPI     Objective:   Physical Exam  Vitals reviewed. Constitutional: He is oriented to person, place, and time. He appears well-developed and well-nourished. No distress.  HENT:  Head: Normocephalic and atraumatic.  Eyes: Conjunctivae and EOM are normal. Pupils are equal, round, and reactive to light.  Neck: Normal range of motion. Neck supple. No thyromegaly present.  Cardiovascular: Normal rate, regular rhythm, normal heart sounds and intact distal pulses.   No murmur heard. Pulmonary/Chest: Effort normal and breath sounds normal. No respiratory distress.  Abdominal: Soft. Bowel sounds are normal. He exhibits no distension.  Musculoskeletal: He exhibits edema (1+ pitting edema bilaterally).  Lymphadenopathy:    He has no cervical adenopathy.  Neurological: He is alert and oriented to person, place, and time. No cranial nerve deficit.  Skin: Skin is warm and dry.  Psychiatric: He has a normal mood and affect. His behavior is normal.          Assessment & Plan:

## 2014-04-11 NOTE — Assessment & Plan Note (Signed)
Chronic problem.  Adequate control but pt w/ persistent LE edema.  Will stop Amlodipine as this is possibly contributing to swelling.  Switch to Benazepril HCTZ combo.  Check labs.  Follow closely.

## 2014-04-11 NOTE — Assessment & Plan Note (Signed)
Chronic problem.  Tolerating statin w/o difficulty.  Check labs.  Adjust meds prn  

## 2014-04-11 NOTE — Assessment & Plan Note (Signed)
New.  Pt's knee and back pain more consistent w/ arthritis rather than RLS.  Stop Gabapentin and start Advil PM prn.  Pt expressed understanding and is in agreement w/ plan.

## 2014-04-11 NOTE — Assessment & Plan Note (Signed)
Chronic problem, on very low dose medication.  Pt's most recent TSH was at high end of normal.  Repeat labs and adjust meds prn.

## 2014-04-12 ENCOUNTER — Encounter: Payer: Self-pay | Admitting: General Practice

## 2014-04-16 ENCOUNTER — Ambulatory Visit (INDEPENDENT_AMBULATORY_CARE_PROVIDER_SITE_OTHER): Payer: Medicare Other | Admitting: Internal Medicine

## 2014-04-16 VITALS — BP 134/74 | HR 78 | Temp 97.4°F | Resp 18 | Ht 69.5 in | Wt 217.8 lb

## 2014-04-16 DIAGNOSIS — R300 Dysuria: Secondary | ICD-10-CM

## 2014-04-16 DIAGNOSIS — R3915 Urgency of urination: Secondary | ICD-10-CM

## 2014-04-16 DIAGNOSIS — R350 Frequency of micturition: Secondary | ICD-10-CM

## 2014-04-16 LAB — POCT URINALYSIS DIPSTICK
BILIRUBIN UA: NEGATIVE
Blood, UA: NEGATIVE
GLUCOSE UA: NEGATIVE
Ketones, UA: NEGATIVE
LEUKOCYTES UA: NEGATIVE
NITRITE UA: NEGATIVE
Protein, UA: NEGATIVE
Spec Grav, UA: 1.01
UROBILINOGEN UA: 0.2
pH, UA: 7

## 2014-04-16 LAB — POCT UA - MICROSCOPIC ONLY
BACTERIA, U MICROSCOPIC: NEGATIVE
Casts, Ur, LPF, POC: NEGATIVE
Crystals, Ur, HPF, POC: NEGATIVE
Epithelial cells, urine per micros: NEGATIVE
Mucus, UA: NEGATIVE
RBC, URINE, MICROSCOPIC: NEGATIVE
WBC, UR, HPF, POC: NEGATIVE
Yeast, UA: NEGATIVE

## 2014-04-16 MED ORDER — PHENAZOPYRIDINE HCL 200 MG PO TABS: 200.0000 mg | ORAL_TABLET | Freq: Three times a day (TID) | ORAL | Status: DC | PRN

## 2014-04-16 MED ORDER — TERAZOSIN HCL 1 MG PO CAPS: 1.0000 mg | ORAL_CAPSULE | Freq: Every day | ORAL | Status: DC

## 2014-04-16 MED ORDER — CIPROFLOXACIN HCL 500 MG PO TABS: 500.0000 mg | ORAL_TABLET | Freq: Two times a day (BID) | ORAL | Status: DC

## 2014-04-16 NOTE — Progress Notes (Signed)
Subjective:  This chart was scribed for Stephen Koyanagi, MD by Delphia Grates, ED Scribe. This patient was seen in room Room/bed 4 and the patient's care was started at 4:51 PM.   Patient ID: Stephen Morrison, male    DOB: May 06, 1946, 68 y.o.   MRN: [id number]  HPI  HPI Comments: Stephen Morrison is a 68 y.o. male who presents to the [facility] complaining of urinary frequency (has voided 3 times since arrival) onset last night. Patient suspects UTI or issues with his prostate, but denies history of either. He reports waking up to void urine at least 5 times last night. There is associated urgency, pressure when attempting to void, retention, and decreased urine output. He denies fever, chills, nausea, emesis, or abdominal pain. He reports taking an OTC medication for UTI without relief. Patient is compliant with all medications, but only takes his medications for gout and back pain as needed. He reports having his prostate checked in the past, but the findings revealed normal results.  Patient Active Problem List   Diagnosis Date Noted  . Osteoarthritis 04/09/2014  . LIPOMA 03/06/2010  . FATTY LIVER DISEASE 03/06/2010  . UNSPECIFIED HYPOTHYROIDISM 10/25/2008  . PROSTATE SPECIFIC ANTIGEN, ELEVATED 03/21/2007  . HYPERLIPIDEMIA 02/03/2007  . GOUT 02/03/2007  . HYPERTENSION 02/03/2007     Review of Systems  Constitutional: Negative for fever and chills.  Gastrointestinal: Negative for nausea, vomiting and abdominal pain.  Genitourinary: Positive for urgency, frequency and decreased urine volume. Negative for dysuria and hematuria.       Objective:   Physical Exam  Nursing note and vitals reviewed. Constitutional: He is oriented to person, place, and time. He appears well-developed and well-nourished. No distress.  HENT:  Head: Normocephalic and atraumatic.  Eyes: Conjunctivae and EOM are normal.  Neck: Neck supple.  Cardiovascular: Normal rate.     Pulmonary/Chest: Effort normal. No respiratory distress.  Genitourinary: Rectum normal. Prostate is enlarged.  Symmetrically enlarged prostate that is actually nontender to palpation. There are no firm nodules  Musculoskeletal: Normal range of motion.  Neurological: He is alert and oriented to person, place, and time.  Skin: Skin is warm and dry.  Psychiatric: He has a normal mood and affect. His behavior is normal.   Results for orders placed in visit on 04/16/14  URINE CULTURE      Result Value Ref Range   Preliminary Report Culture reincubated for better growth    POCT URINALYSIS DIPSTICK      Result Value Ref Range   Color, UA yellow     Clarity, UA clear     Glucose, UA neg     Bilirubin, UA neg     Ketones, UA neg     Spec Grav, UA 1.010     Blood, UA neg     pH, UA 7.0     Protein, UA neg     Urobilinogen, UA 0.2     Nitrite, UA neg     Leukocytes, UA Negative    POCT UA - MICROSCOPIC ONLY      Result Value Ref Range   WBC, Ur, HPF, POC neg     RBC, urine, microscopic neg     Bacteria, U Microscopic neg     Mucus, UA neg     Epithelial cells, urine per micros neg     Crystals, Ur, HPF, POC neg     Casts, Ur, LPF, POC neg     Yeast,  UA neg            Assessment & Plan:  Dysuria -  Urinary frequency Urgency of urination   This fits most with acute prostatic infection Urine culture Antibiotics Close followup  I have completed the patient encounter in its entirety as documented by the scribe, with editing by me where necessary. Darrel Gloss P. Laney Pastor, M.D.

## 2014-04-18 ENCOUNTER — Ambulatory Visit (INDEPENDENT_AMBULATORY_CARE_PROVIDER_SITE_OTHER): Payer: Medicare Other | Admitting: Internal Medicine

## 2014-04-18 ENCOUNTER — Ambulatory Visit (INDEPENDENT_AMBULATORY_CARE_PROVIDER_SITE_OTHER): Payer: Medicare Other

## 2014-04-18 VITALS — BP 140/74 | HR 81 | Temp 97.7°F | Resp 16 | Ht 69.75 in | Wt 220.6 lb

## 2014-04-18 DIAGNOSIS — R300 Dysuria: Secondary | ICD-10-CM | POA: Diagnosis not present

## 2014-04-18 DIAGNOSIS — R338 Other retention of urine: Secondary | ICD-10-CM

## 2014-04-18 DIAGNOSIS — N400 Enlarged prostate without lower urinary tract symptoms: Secondary | ICD-10-CM

## 2014-04-18 DIAGNOSIS — N3943 Post-void dribbling: Secondary | ICD-10-CM

## 2014-04-18 LAB — POCT UA - MICROSCOPIC ONLY
BACTERIA, U MICROSCOPIC: NEGATIVE
CASTS, UR, LPF, POC: NEGATIVE
Crystals, Ur, HPF, POC: NEGATIVE
MUCUS UA: NEGATIVE
RBC, urine, microscopic: NEGATIVE
Yeast, UA: NEGATIVE

## 2014-04-18 LAB — POCT URINALYSIS DIPSTICK
BILIRUBIN UA: NEGATIVE
GLUCOSE UA: 100
Ketones, UA: NEGATIVE
Leukocytes, UA: NEGATIVE
NITRITE UA: POSITIVE
Protein, UA: 30
RBC UA: NEGATIVE
SPEC GRAV UA: 1.01
UROBILINOGEN UA: 1
pH, UA: 5

## 2014-04-18 LAB — URINE CULTURE

## 2014-04-18 MED ORDER — ALFUZOSIN HCL ER 10 MG PO TB24: 10.0000 mg | ORAL_TABLET | Freq: Every day | ORAL | Status: DC

## 2014-04-18 NOTE — Progress Notes (Addendum)
Subjective:  This chart was scribed for Stephen Lin, MD by Stephen Morrison, ED Scribe. This patient was seen in room 12 and the patient's care was started at 4:03 PM.   Patient ID: Stephen Morrison, male    DOB: 07/19/1946, 68 y.o.   MRN: [id number] Chief Complaint  Patient presents with  . Follow-up    Seen Friday for UTI, not feeling any better  . Flank Pain    More uncomfortable than painful x2 days  . Pelvic Pain    Thinks this may be due to straining to start urine stream. Started last night.  . Urinary Frequency   HPI HPI Comments: Stephen Morrison is a 68 y.o. male who presents to the [facility] complaining of continued problems w/ urination--- he continues with frequency with small void and dribbling and difficulty starting stream  Therapy started two days ago for presumed prostatitis in the setting of BPH and he states is not getting any better. No fever or hematuria. No back pain. No belly pain. No nausea and vomiting.   Pt states that he has urinary urgency. He states that he has dribble whenever he tries to use the bathroom. He states that he cannot sit down and get comfortable without the urge to go to the bathroom. He denies any full sensation in his bladder. This kept him awake last night.   Patient Active Problem List   Diagnosis Date Noted  . Osteoarthritis 04/09/2014  . LIPOMA 03/06/2010  . FATTY LIVER DISEASE 03/06/2010  . UNSPECIFIED HYPOTHYROIDISM 10/25/2008  . PROSTATE SPECIFIC ANTIGEN, ELEVATED--- this was evaluated at [facility] urology  03/21/2007  . HYPERLIPIDEMIA 02/03/2007  . GOUT 02/03/2007  . HYPERTENSION 02/03/2007      Prior to Admission medications   Medication Sig Start Date End Date Taking? Authorizing Provider  aspirin 81 MG tablet Take 81 mg by mouth daily.     Yes Historical Provider, MD  benazepril-hydrochlorthiazide (LOTENSIN HCT) 20-12.5 MG per tablet Take 1 tablet by mouth daily. 04/09/14  recently changed to this  Yes  [name], MD  cetirizine (ZYRTEC) 10 MG tablet Take 10 mg by mouth as needed.    not taking  Yes Historical Provider, MD  chlorzoxazone (PARAFON) 500 MG tablet TAKE 1 TABLET TWICE A DAY AS NEEDED FOR MUSCLE SPASMS 04/05/14  not taking  Yes Ricard Dillon, MD  ciprofloxacin (CIPRO) 500 MG tablet Take 1 tablet (500 mg total) by mouth 2 (two) times daily. 04/16/14  Yes Leandrew Koyanagi, MD  diclofenac (VOLTAREN) 75 MG EC tablet Take 75 mg by mouth 2 (two) times daily as needed.   Yes Historical Provider, MD  gabapentin (NEURONTIN) 300 MG capsule  04/03/14  Yes Historical Provider, MD  lansoprazole (PREVACID) 15 MG capsule Take 15 mg by mouth daily as needed.     Yes Historical Provider, MD  levothyroxine (SYNTHROID, LEVOTHROID) 25 MCG tablet TAKE 1 TABLET DAILY 02/08/14  Yes Ricard Dillon, MD  phenazopyridine (PYRIDIUM) 200 MG tablet Take 1 tablet (200 mg total) by mouth 3 (three) times daily as needed for pain. 04/16/14  this is not helping his current symptoms  Yes Leandrew Koyanagi, MD  sildenafil (REVATIO) 20 MG tablet Take 2 tablets (40 mg total) by mouth as needed. 12/11/13  no recent doses  Yes Ricard Dillon, MD  terazosin (HYTRIN) 1 MG capsule Take 1 capsule (1 mg total) by mouth at bedtime. 04/16/14  Yes Leandrew Koyanagi,  MD  amLODipine-benazepril (LOTREL) 10-40 MG per capsule  02/08/14   Historical Provider, MD  colchicine 0.6 MG tablet Take 0.6 mg by mouth 2 (two) times daily as needed.      Historical Provider, MD  phenazopyridine (PYRIDIUM) 95 MG tablet Take 95 mg by mouth 3 (three) times daily as needed for pain.    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     Review  of Systems  Genitourinary: Positive for urgency, frequency and flank pain.  Musculoskeletal: Positive for myalgias.   no bowel movement in the last 36 hours Appetite is diminished    Objective:   Physical Exam  Nursing note and vitals reviewed. Constitutional: He is oriented to person, place, and time. He appears well-developed and well-nourished. No distress.   uncomfortable with urinary symptoms.   HENT:  Head: Normocephalic and atraumatic.  Cardiovascular: Normal rate.   Pulmonary/Chest: Effort normal.  Abdominal: Soft. There is no tenderness. There is no guarding.  Musculoskeletal: Normal range of motion. He exhibits edema.  1+ pitting around the ankles/good peripheral pulses  Neurological: He is alert and oriented to person, place, and time.  Skin: Skin is warm and dry.  Psychiatric: He has a normal mood and affect. His behavior is normal.     Filed Vitals:   04/18/14 1456  BP: 140/74  Pulse: 81  Temp: 97.7 F (36.5 C)  TempSrc: Oral  Resp: 16  Height: 5' 9.75" (1.772 m)  Weight: 220 lb 9.6 oz (100.064 kg)  SpO2: 95%   [facility] reading (PRIMARY) by  Dr. Laney Pastor no signs of obstruction/no obvious bladder distention  Results for orders placed in visit on 04/18/14  POCT UA - MICROSCOPIC ONLY      Result Value Ref Range   WBC, Ur, HPF, POC 0-1     RBC, urine, microscopic neg     Bacteria, U Microscopic neg     Mucus, UA neg     Epithelial cells, urine per micros 0-1     Crystals, Ur, HPF, POC neg     Casts, Ur, LPF, POC neg     Yeast, UA neg    POCT URINALYSIS DIPSTICK      Result Value Ref Range   Color, UA orange     Clarity, UA clear     Glucose, UA 100     Bilirubin, UA neg     Ketones, UA neg     Spec Grav, UA 1.010     Blood, UA neg     pH, UA 5.0     Protein, UA 30     Urobilinogen, UA 1.0     Nitrite, UA positive   this was negative on the urine 2 days ago when the culture is not yet available    Leukocytes, UA Negative          Assessment &  Plan:  I have completed the patient encounter in its entirety as documented by the scribe, with editing by me where necessary. Mechelle Pates P. Laney Pastor, M.D.  Dysuria/frequency/urgency Bennie Hind Adair Patter BPH  We discussed catheterization to see what residual volume is present and see if there is an obstruction  that needed relieving--- he is reluctant to do this and so as an alternative we chose a higher dose alpha  blocker for the next 24-48 hour with followup at urology on Tuesday if not relieved  He will continue Cipro  Meds ordered this encounter  Medications  . alfuzosin (UROXATRAL) 10 MG 24 hr tablet    Sig: Take 1 tablet (10 mg total) by mouth daily with breakfast.    Dispense:  10 tablet    Refill:  0

## 2014-04-19 ENCOUNTER — Emergency Department (HOSPITAL_COMMUNITY)
Admission: EM | Admit: 2014-04-19 | Discharge: 2014-04-19 | Disposition: A | Discharge status: Home / Self Care | Payer: Medicare Other | Attending: Emergency Medicine | Admitting: Emergency Medicine

## 2014-04-19 ENCOUNTER — Encounter (HOSPITAL_COMMUNITY): Payer: Self-pay | Admitting: Emergency Medicine

## 2014-04-19 DIAGNOSIS — Z79899 Other long term (current) drug therapy: Secondary | ICD-10-CM | POA: Insufficient documentation

## 2014-04-19 DIAGNOSIS — R339 Retention of urine, unspecified: Secondary | ICD-10-CM | POA: Diagnosis present

## 2014-04-19 DIAGNOSIS — I10 Essential (primary) hypertension: Secondary | ICD-10-CM | POA: Insufficient documentation

## 2014-04-19 DIAGNOSIS — M5380 Other specified dorsopathies, site unspecified: Secondary | ICD-10-CM | POA: Diagnosis not present

## 2014-04-19 DIAGNOSIS — Z791 Long term (current) use of non-steroidal anti-inflammatories (NSAID): Secondary | ICD-10-CM | POA: Insufficient documentation

## 2014-04-19 DIAGNOSIS — Z87891 Personal history of nicotine dependence: Secondary | ICD-10-CM | POA: Insufficient documentation

## 2014-04-19 DIAGNOSIS — Z880 Allergy status to penicillin: Secondary | ICD-10-CM | POA: Diagnosis not present

## 2014-04-19 DIAGNOSIS — E785 Hyperlipidemia, unspecified: Secondary | ICD-10-CM | POA: Diagnosis not present

## 2014-04-19 LAB — URINALYSIS, ROUTINE W REFLEX MICROSCOPIC
Glucose, UA: NEGATIVE mg/dL
Hgb urine dipstick: NEGATIVE
Ketones, ur: NEGATIVE mg/dL
Nitrite: POSITIVE — AB
Protein, ur: NEGATIVE mg/dL
Specific Gravity, Urine: 1.014 (ref 1.005–1.030)
Urobilinogen, UA: 1 mg/dL (ref 0.0–1.0)
pH: 5.5 (ref 5.0–8.0)

## 2014-04-19 LAB — CBC WITH DIFFERENTIAL/PLATELET
Basophils Absolute: 0 10*3/uL (ref 0.0–0.1)
Basophils Relative: 0 % (ref 0–1)
Eosinophils Absolute: 0 10*3/uL (ref 0.0–0.7)
Eosinophils Relative: 0 % (ref 0–5)
HCT: 46.3 % (ref 39.0–52.0)
Hemoglobin: 16.4 g/dL (ref 13.0–17.0)
Lymphocytes Relative: 10 % — ABNORMAL LOW (ref 12–46)
Lymphs Abs: 1.1 10*3/uL (ref 0.7–4.0)
MCH: 30.7 pg (ref 26.0–34.0)
MCHC: 35.4 g/dL (ref 30.0–36.0)
MCV: 86.7 fL (ref 78.0–100.0)
Monocytes Absolute: 1.3 10*3/uL — ABNORMAL HIGH (ref 0.1–1.0)
Monocytes Relative: 12 % (ref 3–12)
Neutro Abs: 8.3 10*3/uL — ABNORMAL HIGH (ref 1.7–7.7)
Neutrophils Relative %: 78 % — ABNORMAL HIGH (ref 43–77)
Platelets: 298 10*3/uL (ref 150–400)
RBC: 5.34 MIL/uL (ref 4.22–5.81)
RDW: 12.9 % (ref 11.5–15.5)
WBC: 10.7 10*3/uL — ABNORMAL HIGH (ref 4.0–10.5)

## 2014-04-19 LAB — BASIC METABOLIC PANEL
Anion gap: 17 — ABNORMAL HIGH (ref 5–15)
BUN: 22 mg/dL (ref 6–23)
CO2: 24 mEq/L (ref 19–32)
Calcium: 9.4 mg/dL (ref 8.4–10.5)
Chloride: 95 mEq/L — ABNORMAL LOW (ref 96–112)
Creatinine, Ser: 1.48 mg/dL — ABNORMAL HIGH (ref 0.50–1.35)
GFR calc Af Amer: 54 mL/min — ABNORMAL LOW (ref >90)
GFR calc non Af Amer: 47 mL/min — ABNORMAL LOW (ref >90)
Glucose, Bld: 126 mg/dL — ABNORMAL HIGH (ref 70–99)
Potassium: 3.5 mEq/L — ABNORMAL LOW (ref 3.7–5.3)
Sodium: 136 mEq/L — ABNORMAL LOW (ref 137–147)

## 2014-04-19 LAB — URINE MICROSCOPIC-ADD ON

## 2014-04-19 MED ORDER — SULFAMETHOXAZOLE-TMP DS 800-160 MG PO TABS
1.0000 | ORAL_TABLET | Freq: Once | ORAL | Status: AC
  Administered 2014-04-19: 1 via ORAL
  Filled 2014-04-19: qty 1

## 2014-04-19 MED ORDER — TRAMADOL HCL 50 MG PO TABS: 50.0000 mg | ORAL_TABLET | Freq: Four times a day (QID) | ORAL | Status: DC | PRN

## 2014-04-19 MED ORDER — SULFAMETHOXAZOLE-TMP DS 800-160 MG PO TABS: 1.0000 | ORAL_TABLET | Freq: Two times a day (BID) | ORAL | Status: DC

## 2014-04-19 MED ORDER — MORPHINE SULFATE 4 MG/ML IJ SOLN
6.0000 mg | Freq: Once | INTRAMUSCULAR | Status: AC
  Administered 2014-04-19: 6 mg via INTRAMUSCULAR
  Filled 2014-04-19: qty 2

## 2014-04-19 NOTE — ED Notes (Signed)
Pt c/o urinary retention since Friday, has seen doctor 3 times and was told it was normal. Pt states when going to restroom only dribbles come out. Pt has hx of prostate issues.

## 2014-04-19 NOTE — ED Notes (Signed)
Drainage bag changed to leg bag 

## 2014-04-19 NOTE — Discharge Instructions (Signed)
Acute Urinary Retention  Acute urinary retention is when you are unable to pee (urinate). Acute urinary retention is common in older men. Prostates can get bigger, which blocks the flow of pee.   HOME CARE  · Drink enough fluids to keep your pee clear or pale yellow.  · If you are sent home with a tube that drains the bladder (catheter), there will be a drainage bag attached to it. There are two types of bags. One is big that you can wear at night without having to empty it. One is smaller and needs to be emptied more often.  ¨ Keep the drainage bag empty.  ¨ Keep the drainage bag lower than your catheter.  · Only take medicine as told by your doctor.  GET HELP IF:  · You have a low-grade fever.  · You have spasms or you are leaking pee when you have spasms.  GET HELP RIGHT AWAY IF:   · You have chills or a fever.  · Your catheter stops draining pee.  · Your catheter falls out.  · You have increased bleeding that does not stop after you have rested and increased the amount of fluids you had been drinking.  MAKE SURE YOU:   · Understand these instructions.  · Will watch your condition.  · Will get help right away if you are not doing well or get worse.  Document Released: 01/16/2008 Document Revised: 05/20/2013 Document Reviewed: 01/08/2013  [employer]® Patient Information ©2015 [employer]. This information is not intended to replace advice given to you by your health care provider. Make sure you discuss any questions you have with your health care provider.

## 2014-04-20 LAB — URINE CULTURE
Colony Count: NO GROWTH
Culture: NO GROWTH

## 2014-04-25 NOTE — ED Provider Notes (Signed)
CSN: [id number]     Arrival date & time 04/19/14  0938 History   First MD Initiated Contact with Patient 04/19/14 [phone]     Chief Complaint  Patient presents with  . Urinary Retention     (Consider location/radiation/quality/duration/timing/severity/associated sxs/prior Treatment) HPI  50yM with urinary retention. Since Friday has only been able to void small amounts and has urge to void very frequently. Increasing lower abdominal and back discomfort. No fever or chills. Reports hx of BPH but never had issues with urinary retention.    Past Medical History  Diagnosis Date  . Hyperlipidemia   . Hypertension   . PSA elevation   . Gout    Past Surgical History  Procedure Laterality Date  . Vasectomy    . Fracture surgery     Family History  Problem Relation Age of Onset  . Alcohol abuse Father   . Dementia Father   . Hyperlipidemia Mother   . Hypertension Mother    History  Substance Use Topics  . Smoking status: Former Smoker    Quit date: 08/13/1973  . Smokeless tobacco: Not on file  . Alcohol Use: Yes     Comment: OCCASIONALLY    Review of Systems  All systems reviewed and negative, other than as noted in HPI.   Allergies  Amoxicillin  Home Medications   Prior to Admission medications   Medication Sig Start Date End Date Taking? Authorizing Provider  alfuzosin (UROXATRAL) 10 MG 24 hr tablet Take 1 tablet (10 mg total) by mouth daily with breakfast. 04/18/14  Yes Leandrew Koyanagi, MD  aspirin 81 MG tablet Take 81 mg by mouth daily.     Yes Historical Provider, MD  benazepril-hydrochlorthiazide (LOTENSIN HCT) 20-12.5 MG per tablet Take 1 tablet by mouth daily. 04/09/14  Yes [name], MD  cetirizine (ZYRTEC) 10 MG tablet Take 10 mg by mouth as needed for allergies.    Yes Historical Provider, MD  chlorzoxazone (PARAFON) 500 MG tablet Take 500 mg by mouth 4 (four) times daily as needed for muscle spasms.   Yes Historical Provider, MD  ciprofloxacin  (CIPRO) 500 MG tablet Take 1 tablet (500 mg total) by mouth 2 (two) times daily. 04/16/14  Yes Leandrew Koyanagi, MD  colchicine 0.6 MG tablet Take 0.6 mg by mouth 2 (two) times daily as needed (pain).    Yes Historical Provider, MD  diclofenac (VOLTAREN) 75 MG EC tablet Take 75 mg by mouth 2 (two) times daily as needed for mild pain.    Yes Historical Provider, MD  ezetimibe-simvastatin (VYTORIN) 10-40 MG per tablet Take 1 tablet by mouth See admin instructions. Monday Wednesday Friday   Yes Historical Provider, MD  lansoprazole (PREVACID) 15 MG capsule Take 15 mg by mouth daily as needed (for acid reflux).    Yes Historical Provider, MD  levothyroxine (SYNTHROID, LEVOTHROID) 25 MCG tablet Take 25 mcg by mouth daily before breakfast.   Yes Historical Provider, MD  sulfamethoxazole-trimethoprim (BACTRIM DS) 800-160 MG per tablet Take 1 tablet by mouth 2 (two) times daily. 04/19/14   [name], MD  traMADol (ULTRAM) 50 MG tablet Take 1 tablet (50 mg total) by mouth every 6 (six) hours as needed. 04/19/14   [name], MD   BP 157/81  Pulse 71  Temp(Src) 98.5 F (36.9 C) (Oral)  Resp 20  SpO2 98% Physical Exam  Nursing note and vitals reviewed. Constitutional: He appears well-developed and well-nourished. No distress.  HENT:  Head: Normocephalic and atraumatic.  Eyes: Conjunctivae are normal. Right eye exhibits no discharge. Left eye exhibits no discharge.  Neck: Neck supple.  Cardiovascular: Normal rate, regular rhythm and normal heart sounds.  Exam reveals no gallop and no friction rub.   No murmur heard. Pulmonary/Chest: Effort normal and breath sounds normal. No respiratory distress.  Abdominal: Soft. He exhibits distension and mass. There is tenderness.  Firm, tender suprapubic mass consistent with distended bladder.  Musculoskeletal: He exhibits no edema and no tenderness.  Neurological: He is alert.  Skin: Skin is warm and dry.  Psychiatric: He has a normal mood and affect. His  behavior is normal. Thought content normal.    ED Course  Procedures (including critical care time) Labs Review Labs Reviewed  BASIC METABOLIC PANEL - Abnormal; Notable for the following:    Sodium 136 (*)    Potassium 3.5 (*)    Chloride 95 (*)    Glucose, Bld 126 (*)    Creatinine, Ser 1.48 (*)    GFR calc non Af Amer 47 (*)    GFR calc Af Amer 54 (*)    Anion gap 17 (*)    All other components within normal limits  CBC WITH DIFFERENTIAL - Abnormal; Notable for the following:    WBC 10.7 (*)    Neutrophils Relative % 78 (*)    Neutro Abs 8.3 (*)    Lymphocytes Relative 10 (*)    Monocytes Absolute 1.3 (*)    All other components within normal limits  URINALYSIS, ROUTINE W REFLEX MICROSCOPIC - Abnormal; Notable for the following:    Color, Urine ORANGE (*)    Bilirubin Urine SMALL (*)    Nitrite POSITIVE (*)    Leukocytes, UA TRACE (*)    All other components within normal limits  URINE CULTURE  URINE MICROSCOPIC-ADD ON    Imaging Review No results found.   EKG Interpretation None      MDM   Final diagnoses:  Urinary retention    68 year old male with urinary retention. Likely secondary to BPH.  Foley catheter was placed. Return of over a liter of urine. Patient reports his symptoms have slightly improved. We'll discharge her Foley catheter in place. Continued care was discussed. Continue on alfuzosin. Abx for UTI. Urology followup.    [name], MD 04/25/14 [phone]

## 2014-04-27 DIAGNOSIS — N138 Other obstructive and reflux uropathy: Secondary | ICD-10-CM | POA: Diagnosis not present

## 2014-04-27 DIAGNOSIS — R339 Retention of urine, unspecified: Secondary | ICD-10-CM | POA: Diagnosis not present

## 2014-04-27 DIAGNOSIS — N401 Enlarged prostate with lower urinary tract symptoms: Secondary | ICD-10-CM | POA: Diagnosis not present

## 2014-04-27 DIAGNOSIS — N139 Obstructive and reflux uropathy, unspecified: Secondary | ICD-10-CM | POA: Diagnosis not present

## 2014-04-30 DIAGNOSIS — R339 Retention of urine, unspecified: Secondary | ICD-10-CM | POA: Diagnosis not present

## 2014-05-04 ENCOUNTER — Emergency Department (HOSPITAL_COMMUNITY)
Admission: EM | Admit: 2014-05-04 | Discharge: 2014-05-05 | Disposition: A | Discharge status: Home / Self Care | Payer: Medicare Other | Attending: Emergency Medicine | Admitting: Emergency Medicine

## 2014-05-04 ENCOUNTER — Encounter (HOSPITAL_COMMUNITY): Payer: Self-pay | Admitting: Emergency Medicine

## 2014-05-04 DIAGNOSIS — R509 Fever, unspecified: Secondary | ICD-10-CM | POA: Insufficient documentation

## 2014-05-04 DIAGNOSIS — N390 Urinary tract infection, site not specified: Secondary | ICD-10-CM | POA: Diagnosis not present

## 2014-05-04 DIAGNOSIS — Z87891 Personal history of nicotine dependence: Secondary | ICD-10-CM | POA: Diagnosis not present

## 2014-05-04 DIAGNOSIS — E785 Hyperlipidemia, unspecified: Secondary | ICD-10-CM | POA: Diagnosis not present

## 2014-05-04 DIAGNOSIS — Z79899 Other long term (current) drug therapy: Secondary | ICD-10-CM | POA: Insufficient documentation

## 2014-05-04 DIAGNOSIS — M109 Gout, unspecified: Secondary | ICD-10-CM | POA: Insufficient documentation

## 2014-05-04 DIAGNOSIS — Z880 Allergy status to penicillin: Secondary | ICD-10-CM | POA: Insufficient documentation

## 2014-05-04 DIAGNOSIS — I10 Essential (primary) hypertension: Secondary | ICD-10-CM | POA: Diagnosis not present

## 2014-05-04 DIAGNOSIS — T83091A Other mechanical complication of indwelling urethral catheter, initial encounter: Secondary | ICD-10-CM | POA: Diagnosis not present

## 2014-05-04 LAB — BASIC METABOLIC PANEL
ANION GAP: 19 — AB (ref 5–15)
BUN: 15 mg/dL (ref 6–23)
CALCIUM: 8.9 mg/dL (ref 8.4–10.5)
CO2: 23 meq/L (ref 19–32)
CREATININE: 1.05 mg/dL (ref 0.50–1.35)
Chloride: 94 mEq/L — ABNORMAL LOW (ref 96–112)
GFR calc non Af Amer: 70 mL/min — ABNORMAL LOW (ref >90)
GFR, EST AFRICAN AMERICAN: 82 mL/min — AB (ref >90)
Glucose, Bld: 100 mg/dL — ABNORMAL HIGH (ref 70–99)
Potassium: 3.5 mEq/L — ABNORMAL LOW (ref 3.7–5.3)
SODIUM: 136 meq/L — AB (ref 137–147)

## 2014-05-04 LAB — CBC WITH DIFFERENTIAL/PLATELET
BASOS ABS: 0 10*3/uL (ref 0.0–0.1)
BASOS PCT: 0 % (ref 0–1)
Eosinophils Absolute: 0 10*3/uL (ref 0.0–0.7)
Eosinophils Relative: 0 % (ref 0–5)
HCT: 45.1 % (ref 39.0–52.0)
HEMOGLOBIN: 16.2 g/dL (ref 13.0–17.0)
LYMPHS PCT: 9 % — AB (ref 12–46)
Lymphs Abs: 1.7 10*3/uL (ref 0.7–4.0)
MCH: 31.2 pg (ref 26.0–34.0)
MCHC: 35.9 g/dL (ref 30.0–36.0)
MCV: 86.7 fL (ref 78.0–100.0)
MONO ABS: 1.7 10*3/uL — AB (ref 0.1–1.0)
Monocytes Relative: 9 % (ref 3–12)
Neutro Abs: 14.8 10*3/uL — ABNORMAL HIGH (ref 1.7–7.7)
Neutrophils Relative %: 82 % — ABNORMAL HIGH (ref 43–77)
PLATELETS: 317 10*3/uL (ref 150–400)
RBC: 5.2 MIL/uL (ref 4.22–5.81)
RDW: 12.5 % (ref 11.5–15.5)
WBC: 18.2 10*3/uL — ABNORMAL HIGH (ref 4.0–10.5)

## 2014-05-04 LAB — URINALYSIS, ROUTINE W REFLEX MICROSCOPIC
BILIRUBIN URINE: NEGATIVE
Glucose, UA: NEGATIVE mg/dL
KETONES UR: NEGATIVE mg/dL
NITRITE: NEGATIVE
PROTEIN: 100 mg/dL — AB
Specific Gravity, Urine: 1.016 (ref 1.005–1.030)
UROBILINOGEN UA: 1 mg/dL (ref 0.0–1.0)
pH: 7.5 (ref 5.0–8.0)

## 2014-05-04 LAB — URINE MICROSCOPIC-ADD ON

## 2014-05-04 MED ORDER — KETOROLAC TROMETHAMINE 30 MG/ML IJ SOLN
30.0000 mg | Freq: Once | INTRAMUSCULAR | Status: AC
  Administered 2014-05-04: 30 mg via INTRAVENOUS
  Filled 2014-05-04: qty 1

## 2014-05-04 MED ORDER — DEXTROSE 5 % IV SOLN
2.0000 g | Freq: Once | INTRAVENOUS | Status: AC
  Administered 2014-05-04: 2 g via INTRAVENOUS
  Filled 2014-05-04: qty 2

## 2014-05-04 NOTE — ED Notes (Signed)
Pt states that he had a cath placed on 9/7 for urinary retention; pt states that he has seen the Urologist and the cath has been replaced twice since then; pt states that he has been fine until this afternoon around 3pm when he developed fever and chills; pt reports temp was 102.6 at home and took Ibuprofen; pt states that after the Ibuprofen that temp is down and he now longer has chills; pt states that he called the Urologist and was advised to come here for evaluation.

## 2014-05-04 NOTE — ED Provider Notes (Signed)
CSN: [id number]     Arrival date & time 05/04/14  1835 History   First MD Initiated Contact with Patient 05/04/14 2115     Chief Complaint  Patient presents with  . Fever     (Consider location/radiation/quality/duration/timing/severity/associated sxs/prior Treatment) HPI Comments: Patient presents to the emergency department with chief complaint of fever. He states that he ran a fever to 102.6 today. He states that he has been in and out of the emergency department several times for urinary retention. He has had a catheter since 9/7. He has been taking Bactrim for UTI. He denies any nausea, vomiting, diarrhea, or constipation. Denies any chest pain, or shortness of breath. There are no aggravating or alleviating factors. Patient states fever came on suddenly today. He is feeling well otherwise. He is being followed by Dr. Risa Grill of urology.  The history is provided by the patient. No language interpreter was used.    Past Medical History  Diagnosis Date  . Hyperlipidemia   . Hypertension   . PSA elevation   . Gout    Past Surgical History  Procedure Laterality Date  . Vasectomy    . Fracture surgery     Family History  Problem Relation Age of Onset  . Alcohol abuse Father   . Dementia Father   . Hyperlipidemia Mother   . Hypertension Mother    History  Substance Use Topics  . Smoking status: Former Smoker    Quit date: 08/13/1973  . Smokeless tobacco: Not on file  . Alcohol Use: Yes     Comment: OCCASIONALLY    Review of Systems  Constitutional: Positive for fever and chills.  Gastrointestinal: Negative for nausea, vomiting, abdominal pain, diarrhea and constipation.  All other systems reviewed and are negative.     Allergies  Amoxicillin  Home Medications   Prior to Admission medications   Medication Sig Start Date End Date Taking? Authorizing Provider  benazepril-hydrochlorthiazide (LOTENSIN HCT) 20-12.5 MG per tablet Take 1 tablet by mouth daily. 04/09/14   Yes [name], MD  cetirizine (ZYRTEC) 10 MG tablet Take 10 mg by mouth as needed for allergies.    Yes Historical Provider, MD  chlorzoxazone (PARAFON) 500 MG tablet Take 500 mg by mouth 2 (two) times daily as needed for muscle spasms.   Yes Historical Provider, MD  colchicine 0.6 MG tablet Take 0.6 mg by mouth 2 (two) times daily as needed (pain).    Yes Historical Provider, MD  ezetimibe-simvastatin (VYTORIN) 10-40 MG per tablet Take 1 tablet by mouth See admin instructions. Monday Wednesday Friday   Yes Historical Provider, MD  lansoprazole (PREVACID) 15 MG capsule Take 15 mg by mouth daily as needed (for acid reflux).    Yes Historical Provider, MD  levothyroxine (SYNTHROID, LEVOTHROID) 25 MCG tablet Take 25 mcg by mouth daily before breakfast.   Yes Historical Provider, MD  traMADol (ULTRAM) 50 MG tablet Take 1 tablet (50 mg total) by mouth every 6 (six) hours as needed. 04/19/14  Yes [name], MD   BP 171/92  Pulse 95  Temp(Src) 99.6 F (37.6 C) (Oral)  Resp 16  SpO2 94% Physical Exam  Nursing note and vitals reviewed. Constitutional: He is oriented to person, place, and time. He appears well-developed and well-nourished.  HENT:  Head: Normocephalic and atraumatic.  Eyes: Conjunctivae and EOM are normal. Pupils are equal, round, and reactive to light. Right eye exhibits no discharge. Left eye exhibits no discharge. No scleral icterus.  Neck: Normal range  of motion. Neck supple. No JVD present.  Cardiovascular: Normal rate, regular rhythm and normal heart sounds.  Exam reveals no gallop and no friction rub.   No murmur heard. Pulmonary/Chest: Effort normal and breath sounds normal. No respiratory distress. He has no wheezes. He has no rales. He exhibits no tenderness.  Abdominal: Soft. He exhibits no distension and no mass. There is no tenderness. There is no rebound and no guarding.  No focal abdominal tenderness, no RLQ tenderness or pain at McBurney's point, no RUQ  tenderness or Murphy's sign, no left-sided abdominal tenderness, no fluid wave, or signs of peritonitis   Genitourinary:  Indwelling Foley catheter, urine is dark yellow, no pus or discharge  Musculoskeletal: Normal range of motion. He exhibits no edema and no tenderness.  Neurological: He is alert and oriented to person, place, and time.  Skin: Skin is warm and dry.  Psychiatric: He has a normal mood and affect. His behavior is normal. Judgment and thought content normal.    ED Course  Procedures (including critical care time) Results for orders placed during the hospital encounter of 05/04/14  URINALYSIS, ROUTINE W REFLEX MICROSCOPIC      Result Value Ref Range   Color, Urine YELLOW  YELLOW   APPearance CLOUDY (*) CLEAR   Specific Gravity, Urine 1.016  1.005 - 1.030   pH 7.5  5.0 - 8.0   Glucose, UA NEGATIVE  NEGATIVE mg/dL   Hgb urine dipstick MODERATE (*) NEGATIVE   Bilirubin Urine NEGATIVE  NEGATIVE   Ketones, ur NEGATIVE  NEGATIVE mg/dL   Protein, ur 100 (*) NEGATIVE mg/dL   Urobilinogen, UA 1.0  0.0 - 1.0 mg/dL   Nitrite NEGATIVE  NEGATIVE   Leukocytes, UA LARGE (*) NEGATIVE  URINE MICROSCOPIC-ADD ON      Result Value Ref Range   Squamous Epithelial / LPF RARE  RARE   WBC, UA 21-50  <3 WBC/hpf   RBC / HPF 7-10  <3 RBC/hpf   Bacteria, UA RARE  RARE  CBC WITH DIFFERENTIAL      Result Value Ref Range   WBC 18.2 (*) 4.0 - 10.5 K/uL   RBC 5.20  4.22 - 5.81 MIL/uL   Hemoglobin 16.2  13.0 - 17.0 g/dL   HCT 45.1  39.0 - 52.0 %   MCV 86.7  78.0 - 100.0 fL   MCH 31.2  26.0 - 34.0 pg   MCHC 35.9  30.0 - 36.0 g/dL   RDW 12.5  11.5 - 15.5 %   Platelets 317  150 - 400 K/uL   Neutrophils Relative % 82 (*) 43 - 77 %   Neutro Abs 14.8 (*) 1.7 - 7.7 K/uL   Lymphocytes Relative 9 (*) 12 - 46 %   Lymphs Abs 1.7  0.7 - 4.0 K/uL   Monocytes Relative 9  3 - 12 %   Monocytes Absolute 1.7 (*) 0.1 - 1.0 K/uL   Eosinophils Relative 0  0 - 5 %   Eosinophils Absolute 0.0  0.0 - 0.7 K/uL    Basophils Relative 0  0 - 1 %   Basophils Absolute 0.0  0.0 - 0.1 K/uL  BASIC METABOLIC PANEL      Result Value Ref Range   Sodium 136 (*) 137 - 147 mEq/L   Potassium 3.5 (*) 3.7 - 5.3 mEq/L   Chloride 94 (*) 96 - 112 mEq/L   CO2 23  19 - 32 mEq/L   Glucose, Bld 100 (*) 70 - 99 mg/dL  BUN 15  6 - 23 mg/dL   Creatinine, Ser 1.05  0.50 - 1.35 mg/dL   Calcium 8.9  8.4 - 10.5 mg/dL   GFR calc non Af Amer 70 (*) >90 mL/min   GFR calc Af Amer 82 (*) >90 mL/min   Anion gap 19 (*) 5 - 15   Dg Abd 1 View  04/18/2014   CLINICAL DATA:  Acute urinary retention.  EXAM: ABDOMEN - 1 VIEW  COMPARISON:  None.  FINDINGS: Bowel gas pattern is nonobstructive with mild fecal retention throughout the colon. There is a calcification of the right pelvis likely a phlebolith. There are degenerative changes of the spine and hips.  IMPRESSION: Nonobstructive bowel gas pattern.   Electronically Signed   By: Marin Olp M.D.   On: 04/18/2014 18:04     Imaging Review No results found.   EKG Interpretation None      MDM   Final diagnoses:  UTI (lower urinary tract infection)   Patient with indwelling Foley cath secondary to urinary retention. He reports running a fever today. No chest pain, shortness of breath, abdominal pain, or other symptoms.  Vital signs are stable, patient looks well.  Patient seen by and discussed with Dr. Jeneen Rinks, who recommends discharge to home on Cipro. His electrolytes were a little low, I suspect that this will correct with fluids.  Patient given fluids and antibiotics. He appears well. He is stable and ready for discharge. Recommend followup with Dr. Risa Grill. Patient understands and agrees with the plan. He feels well enough to go home.    Montine Circle, PA-C 05/05/14 [phone]

## 2014-05-04 NOTE — ED Provider Notes (Addendum)
Patient seen and evaluated. Care discussed with Lorre Munroe PA. Patient had 2 recent episodes of urinary retention. Seen at urgent care given antibiotics and discharged without catheter. Presented here 2 days later. Had a catheter placed. On the visit. A urine culture which was negative. After 2 days of Cipro he was changed to Bactrim. He has since followed with Dr. Jeralyn Ruths. Get your dynamics performed 4 days ago. He had 700 cc of urine still without bladder contraction or sensation. As he continues with catheter. Continues to have good output from the catheter. Had episode of fever at home today and presents here. He states he otherwise feels well. His hemodynamic stable here. UA c WBC  elevated. Urine appears infected. Cultures pending.  Tanna Furry, MD 05/06/14 2355  Tanna Furry, MD 05/07/14 [phone]

## 2014-05-05 ENCOUNTER — Encounter (HOSPITAL_COMMUNITY): Payer: Self-pay | Admitting: Emergency Medicine

## 2014-05-05 ENCOUNTER — Emergency Department (HOSPITAL_COMMUNITY)
Admission: EM | Admit: 2014-05-05 | Discharge: 2014-05-06 | Disposition: A | Discharge status: Home / Self Care | Payer: Medicare Other | Attending: Emergency Medicine | Admitting: Emergency Medicine

## 2014-05-05 DIAGNOSIS — I10 Essential (primary) hypertension: Secondary | ICD-10-CM | POA: Diagnosis not present

## 2014-05-05 DIAGNOSIS — Z87891 Personal history of nicotine dependence: Secondary | ICD-10-CM | POA: Diagnosis not present

## 2014-05-05 DIAGNOSIS — Z79899 Other long term (current) drug therapy: Secondary | ICD-10-CM | POA: Insufficient documentation

## 2014-05-05 DIAGNOSIS — M109 Gout, unspecified: Secondary | ICD-10-CM | POA: Insufficient documentation

## 2014-05-05 DIAGNOSIS — T83091A Other mechanical complication of indwelling urethral catheter, initial encounter: Secondary | ICD-10-CM | POA: Diagnosis not present

## 2014-05-05 DIAGNOSIS — T83511D Infection and inflammatory reaction due to indwelling urethral catheter, subsequent encounter: Secondary | ICD-10-CM

## 2014-05-05 DIAGNOSIS — R509 Fever, unspecified: Secondary | ICD-10-CM | POA: Diagnosis not present

## 2014-05-05 DIAGNOSIS — Z880 Allergy status to penicillin: Secondary | ICD-10-CM | POA: Insufficient documentation

## 2014-05-05 DIAGNOSIS — N390 Urinary tract infection, site not specified: Secondary | ICD-10-CM | POA: Insufficient documentation

## 2014-05-05 DIAGNOSIS — Z792 Long term (current) use of antibiotics: Secondary | ICD-10-CM | POA: Insufficient documentation

## 2014-05-05 MED ORDER — CIPROFLOXACIN HCL 500 MG PO TABS: 500.0000 mg | ORAL_TABLET | Freq: Two times a day (BID) | ORAL | Status: DC

## 2014-05-05 NOTE — ED Provider Notes (Signed)
CSN: [id number]     Arrival date & time 05/05/14  2229 History   First MD Initiated Contact with Patient 05/05/14 2335     Chief Complaint  Patient presents with  . Fever     (Consider location/radiation/quality/duration/timing/severity/associated sxs/prior Treatment) HPI Stephen Morrison is a 68 y.o. male with history of hypertension, gout, recent diagnosis of urinary retention with indwelling Foley, presents emergency department complaining of fever. Patient states 2 weeks ago he had urinary retention, had a Foley placed at that time, since then he's followed up by urology and had some studies done and was told that "bladder is not working." Yesterday patient states he started running a fever 102.4. He denies any other symptoms. He was seen in emergency department and diagnosed with urinary tract infection. He was given IV dose of Cipro and discharged home on oral Cipro for the infection. Patient states today he has been running a fever all day again. Fever up to 103. States concerned that antibiotics not working. Stats last had infection earlier this month, was switched from cipro to bactrim and states felt better. States no abdominal pain, no back pain. No n/v/d. No URI symptoms. No other complaints.   Past Medical History  Diagnosis Date  . Hyperlipidemia   . Hypertension   . PSA elevation   . Gout    Past Surgical History  Procedure Laterality Date  . Vasectomy    . Fracture surgery     Family History  Problem Relation Age of Onset  . Alcohol abuse Father   . Dementia Father   . Hyperlipidemia Mother   . Hypertension Mother    History  Substance Use Topics  . Smoking status: Former Smoker    Quit date: 08/13/1973  . Smokeless tobacco: Not on file  . Alcohol Use: Yes     Comment: OCCASIONALLY    Review of Systems  Constitutional: Positive for fever and chills.  Respiratory: Negative for cough, chest tightness and shortness of breath.   Cardiovascular: Negative for chest  pain, palpitations and leg swelling.  Gastrointestinal: Negative for nausea, vomiting, abdominal pain, diarrhea and abdominal distention.  Genitourinary: Positive for difficulty urinating. Negative for dysuria, urgency, frequency, hematuria and flank pain.  Musculoskeletal: Positive for myalgias. Negative for neck pain and neck stiffness.  Skin: Negative for rash.  Allergic/Immunologic: Negative for immunocompromised state.  Neurological: Negative for dizziness, weakness, light-headedness, numbness and headaches.  All other systems reviewed and are negative.     Allergies  Amoxicillin  Home Medications   Prior to Admission medications   Medication Sig Start Date End Date Taking? Authorizing Provider  acetaminophen (TYLENOL) 500 MG tablet Take 1,000 mg by mouth every 6 (six) hours as needed for fever.   Yes Historical Provider, MD  benazepril-hydrochlorthiazide (LOTENSIN HCT) 20-12.5 MG per tablet Take 1 tablet by mouth daily. 04/09/14  Yes [name], MD  cetirizine (ZYRTEC) 10 MG tablet Take 10 mg by mouth as needed for allergies.    Yes Historical Provider, MD  chlorzoxazone (PARAFON) 500 MG tablet Take 500 mg by mouth 2 (two) times daily as needed for muscle spasms.   Yes Historical Provider, MD  ciprofloxacin (CIPRO) 500 MG tablet Take 1 tablet (500 mg total) by mouth 2 (two) times daily. 05/05/14  Yes Montine Circle, PA-C  colchicine 0.6 MG tablet Take 0.6 mg by mouth 2 (two) times daily as needed (pain).    Yes Historical Provider, MD  ezetimibe-simvastatin (VYTORIN) 10-40 MG per tablet Take 1  tablet by mouth See admin instructions. Monday Wednesday Friday   Yes Historical Provider, MD  ibuprofen (ADVIL,MOTRIN) 200 MG tablet Take 400 mg by mouth every 6 (six) hours as needed for fever.   Yes Historical Provider, MD  lansoprazole (PREVACID) 15 MG capsule Take 15 mg by mouth daily as needed (for acid reflux).    Yes Historical Provider, MD  levothyroxine (SYNTHROID, LEVOTHROID)  25 MCG tablet Take 25 mcg by mouth daily before breakfast.   Yes Historical Provider, MD  traMADol (ULTRAM) 50 MG tablet Take 1 tablet (50 mg total) by mouth every 6 (six) hours as needed. 04/19/14  Yes [name], MD   BP 162/84  Pulse 72  Temp(Src) 99.8 F (37.7 C) (Oral)  Resp 18  Ht 5\' 10"  (1.778 m)  Wt 214 lb (97.07 kg)  BMI 30.71 kg/m2  SpO2 94% Physical Exam  Nursing note and vitals reviewed. Constitutional: He is oriented to person, place, and time. He appears well-developed and well-nourished. No distress.  HENT:  Head: Normocephalic and atraumatic.  Eyes: Conjunctivae are normal.  Neck: Neck supple.  Cardiovascular: Normal rate, regular rhythm and normal heart sounds.   Pulmonary/Chest: Effort normal. No respiratory distress. He has no wheezes. He has no rales.  Abdominal: Soft. Bowel sounds are normal. He exhibits no distension. There is no tenderness. There is no rebound.  No cva tenderness bilaterally  Genitourinary:  Foley in place  Musculoskeletal: He exhibits no edema.  Neurological: He is alert and oriented to person, place, and time.  Skin: Skin is warm and dry.    ED Course  Procedures (including critical care time) Labs Review Labs Reviewed  URINALYSIS, ROUTINE W REFLEX MICROSCOPIC - Abnormal; Notable for the following:    Color, Urine AMBER (*)    APPearance CLOUDY (*)    Hgb urine dipstick LARGE (*)    Bilirubin Urine SMALL (*)    Protein, ur 100 (*)    Nitrite POSITIVE (*)    Leukocytes, UA SMALL (*)    All other components within normal limits  CBC WITH DIFFERENTIAL - Abnormal; Notable for the following:    WBC 12.5 (*)    Neutrophils Relative % 85 (*)    Neutro Abs 10.7 (*)    Lymphocytes Relative 9 (*)    All other components within normal limits  BASIC METABOLIC PANEL - Abnormal; Notable for the following:    Sodium 135 (*)    Potassium 3.1 (*)    Glucose, Bld 117 (*)    GFR calc non Af Amer 57 (*)    GFR calc Af Amer 66 (*)    Anion  gap 16 (*)    All other components within normal limits  URINE MICROSCOPIC-ADD ON  I-STAT CG4 LACTIC ACID, ED    Imaging Review No results found.   EKG Interpretation None      MDM   Final diagnoses:  Urinary tract infection associated with catheterization of urinary tract, subsequent encounter   Patient with indwelling catheter, diagnosed with urinary tract infection yesterday, discharged home. On Cipro. Presents with continued fevers at home. He is otherwise asymptomatic. No abdominal pain or flank pain. No exam findings. Afebrile emergency department with a temperature of 99.5. Will repeat lab work including white blood cell count, renal function, urine analysis   White count is improved from 18 yesterday to 12.5 today. Urine continues to be infected. Cultures of the urinalysis were sent yesterday, and still pending. Discussed this with Dr. Aline Brochure who has  seen patient as well. Degrees the patient is nontoxic appearing, he is in no distress, he is asymptomatic, his temperature vital signs here are normal. He stable for discharge home pending cultures of the urine. He is to followup with the urologist.  Danley Danker Vitals:   05/05/14 2303 05/06/14 0132  BP: 162/84 137/73  Pulse: 72 56  Temp: 99.8 F (37.7 C) 98.2 F (36.8 C)  TempSrc: Oral Oral  Resp: 18 16  Height: 5\' 10"  (1.778 m)   Weight: 214 lb (97.07 kg)   SpO2: 94% 94%      Renold Genta, PA-C 05/06/14 [phone]

## 2014-05-05 NOTE — ED Notes (Signed)
Pt states he was here last night and was diagnosed with a UTI and was given IVF and IV cipro  Pt states he was discharged to home with po cipro  Pt states he has been taking that and using ibuprofen at home for the fever  Pt states tonight his fever at home was 103 so he called his dr and was told to return here for further evaluation  Pt last took tylenol for his fever around 10pm

## 2014-05-05 NOTE — Discharge Instructions (Signed)

## 2014-05-05 NOTE — ED Notes (Signed)
Spoke with Stephen Morrison in lab she said bmp has resulted she will tube hard copy of results to ED

## 2014-05-06 DIAGNOSIS — N390 Urinary tract infection, site not specified: Secondary | ICD-10-CM | POA: Diagnosis not present

## 2014-05-06 LAB — CBC WITH DIFFERENTIAL/PLATELET
Basophils Absolute: 0 10*3/uL (ref 0.0–0.1)
Basophils Relative: 0 % (ref 0–1)
EOS ABS: 0 10*3/uL (ref 0.0–0.7)
Eosinophils Relative: 0 % (ref 0–5)
HCT: 42.7 % (ref 39.0–52.0)
Hemoglobin: 15 g/dL (ref 13.0–17.0)
LYMPHS ABS: 1.1 10*3/uL (ref 0.7–4.0)
Lymphocytes Relative: 9 % — ABNORMAL LOW (ref 12–46)
MCH: 30.5 pg (ref 26.0–34.0)
MCHC: 35.1 g/dL (ref 30.0–36.0)
MCV: 87 fL (ref 78.0–100.0)
Monocytes Absolute: 0.7 10*3/uL (ref 0.1–1.0)
Monocytes Relative: 6 % (ref 3–12)
NEUTROS ABS: 10.7 10*3/uL — AB (ref 1.7–7.7)
Neutrophils Relative %: 85 % — ABNORMAL HIGH (ref 43–77)
PLATELETS: 231 10*3/uL (ref 150–400)
RBC: 4.91 MIL/uL (ref 4.22–5.81)
RDW: 12.9 % (ref 11.5–15.5)
WBC: 12.5 10*3/uL — ABNORMAL HIGH (ref 4.0–10.5)

## 2014-05-06 LAB — BASIC METABOLIC PANEL
Anion gap: 16 — ABNORMAL HIGH (ref 5–15)
BUN: 21 mg/dL (ref 6–23)
CO2: 22 mEq/L (ref 19–32)
Calcium: 8.5 mg/dL (ref 8.4–10.5)
Chloride: 97 mEq/L (ref 96–112)
Creatinine, Ser: 1.25 mg/dL (ref 0.50–1.35)
GFR calc Af Amer: 66 mL/min — ABNORMAL LOW (ref >90)
GFR, EST NON AFRICAN AMERICAN: 57 mL/min — AB (ref >90)
Glucose, Bld: 117 mg/dL — ABNORMAL HIGH (ref 70–99)
POTASSIUM: 3.1 meq/L — AB (ref 3.7–5.3)
SODIUM: 135 meq/L — AB (ref 137–147)

## 2014-05-06 LAB — URINALYSIS, ROUTINE W REFLEX MICROSCOPIC
Glucose, UA: NEGATIVE mg/dL
Ketones, ur: NEGATIVE mg/dL
NITRITE: POSITIVE — AB
PH: 6 (ref 5.0–8.0)
Protein, ur: 100 mg/dL — AB
SPECIFIC GRAVITY, URINE: 1.027 (ref 1.005–1.030)
Urobilinogen, UA: 1 mg/dL (ref 0.0–1.0)

## 2014-05-06 LAB — I-STAT CG4 LACTIC ACID, ED: LACTIC ACID, VENOUS: 0.69 mmol/L (ref 0.5–2.2)

## 2014-05-06 LAB — URINE MICROSCOPIC-ADD ON

## 2014-05-06 MED ORDER — POTASSIUM CHLORIDE CRYS ER 20 MEQ PO TBCR
40.0000 meq | EXTENDED_RELEASE_TABLET | Freq: Once | ORAL | Status: AC
  Administered 2014-05-06: 40 meq via ORAL
  Filled 2014-05-06: qty 2

## 2014-05-06 NOTE — Discharge Instructions (Signed)
Continue cipro. Follow up with Dr. Risa Grill. Return if worsening symptoms.   Catheter-Associated Urinary Tract Infection FAQs WHAT IS "CATHETER-ASSOCIATED" URINARY TRACT INFECTION? A urinary tract infection (also called "UTI") is an infection in the urinary system, which includes the bladder (which stores the urine) and the kidneys (which filter the blood to make urine). Germs (for example, bacteria or yeasts) do not normally live in these areas; but if germs are introduced, an infection can occur. If you have a urinary catheter, germs can travel along the catheter and cause an infection in your bladder or your kidney; in that case it is called a catheter-associated urinary tract infection (or "CA-UTI").  WHAT IS A URINARY CATHETER? A urinary catheter is a thin tube placed in the bladder to drain urine. Urine drains through the tube into a bag that collects the urine. A urinary  catheter may be used:  If you are not able to urinate on your own.  To measure the amount of urine that you make, for example, during intensive care.  During and after some types of surgery.  During some tests of the kidneys and bladder . People with urinary catheters have a much higher chance of getting a urinary tract infection than people who don't have a catheter. HOW DO I GET A CATHETER-ASSOCIATED URINARY TRACT INFECTION (CA-UTI)? If germs enter the urinary tract, they may cause an infection. Many of the germs that cause a catheter-associated urinary tract infection are common germs found in your intestines that do not usually cause an infection there. Germs can enter the urinary tract when the catheter is being put in or while the catheter remains in the bladder.  WHAT ARE THE SYMPTOMS OF A URINARY TRACT INFECTION?  Some of the common symptoms of a urinary tract infection are:  Burning or pain in the lower abdomen (that is, below the stomach).  Fever.  Bloody urine may be a sign of infection, but is also caused  by other problems .  Burning during urination or an increase in the frequency of urination after the catheter is removed. Sometimes people with catheter-associated urinary tract infections do not have these symptoms of infection. CAN CATHETER-ASSOCIATED URINARY TRACT INFECTIONS BE TREATED? Yes, most catheter-associated urinary tract infections can be treated with antibiotics and removal or change of the catheter. Your doctor will determine which antibiotic is best for you.  WHAT ARE SOME OF THE THINGS THAT HOSPITALS ARE DOING TO PREVENT CATHETER-ASSOCIATED URINARY TRACT INFECTIONS? To prevent urinary tract infections, doctors and nurses take the following actions.  Catheter insertion  Catheters are put in only when necessary and they are removed as soon as possible.  Only properly trained persons insert catheters using sterile ("clean") technique.  The skin in the area where the catheter will be inserted is cleaned before inserting the catheter.  Other methods to drain the urine are sometimes used, such as:  External catheters in men (these look like condoms and are placed over the penis rather than into the penis)  Putting a temporary catheter in to drain the urine and removing it right away. This is called intermittent urethral catheterization. Catheter care  Healthcare providers clean their hands by washing them with soap and water or using an alcohol-based hand rub before and after touching your catheter.  If you do not see your providers clean their hands, please ask them to do so.  Avoid disconnecting the catheter and drain tube. This helps to prevent germs from getting into the catheter tube.  The catheter is secured to the leg to prevent pulling on the catheter.  Avoid twisting or kinking the catheter.  Keep the bag lower than the bladder to prevent urine from backflowing to the bladder.  Empty the bag regularly. The drainage spout should not touch anything while emptying  the bag. WHAT CAN I DO TO HELP PREVENT CATHETER-ASSOCIATED URINARY TRACT INFECTIONS IF I HAVE A CATHETER?  Always clean your hands before and after doing catheter care.  Always keep your urine bag below the level of your bladder.  Do not tug or pull on the tubing.  Do not twist or kink the catheter tubing.  Ask your healthcare provider each day if you still need the catheter. WHAT DO I NEED TO DO WHEN I [place]?  If you will be going home with a catheter, your doctor or nurse should explain everything you need to know about taking care of the catheter. Make sure you understand how to care for it before you leave the hospital.  If you develop any of the symptoms of a urinary tract infection, such as burning or pain in the lower abdomen, fever, or an increase in the frequency of urination, contact your doctor or nurse immediately.  Before you go home, make sure you know who to contact if you have questions or problems after you get home. If you have questions, please ask your doctor or nurse. Developed and co-sponsored by [employer] for [place] [phone]); Infectious Diseases Society of [place] (IDSA); The [place] [address]; Association for Professionals in Infection Control and Epidemiology (APIC); Center for Disease Control (CDC); and The Joint Commission Document Released: 04/23/2012 Document Reviewed: 04/23/2012 [facility] Patient Information 2015 [place]. This information is not intended to replace advice given to you by your health care provider. Make sure you discuss any questions you have with your health care provider.

## 2014-05-06 NOTE — ED Provider Notes (Signed)
Medical screening examination/treatment/procedure(s) were conducted as a shared visit with non-physician practitioner(s) and myself.  I personally evaluated the patient during the encounter.   EKG Interpretation None      I interviewed and examined the patient. Lungs are CTAB. Cardiac exam wnl. Abdomen soft.  Pt well appearing. No pain, no vomiting. Ongoing fevers, but dec in wbc. Will continue current therapy and rec pt return for any worsening. Rec pt follow w/ pcp closely to see what urine culture grows.   Pamella Pert, MD 05/06/14 [phone]

## 2014-05-07 LAB — URINE CULTURE: Colony Count: >=100000

## 2014-05-07 NOTE — ED Provider Notes (Signed)
Medical screening examination/treatment/procedure(s) were conducted as a shared visit with non-physician practitioner(s) and myself.  I personally evaluated the patient during the encounter.   EKG Interpretation None      Please see my additional dicttation.  Tanna Furry, MD 05/07/14 [phone]

## 2014-05-09 ENCOUNTER — Telehealth (HOSPITAL_BASED_OUTPATIENT_CLINIC_OR_DEPARTMENT_OTHER): Payer: Self-pay

## 2014-05-09 NOTE — Telephone Encounter (Signed)
Post ED Visit - Positive Culture Follow-up  Culture report reviewed by antimicrobial stewardship pharmacist: []  Wes Dulaney, Pharm.D., BCPS []  Heide Guile, Pharm.D., BCPS []  Alycia Rossetti, Pharm.D., BCPS []  [place], [place].D., BCPS, AAHIVP []  Legrand Como, Pharm.D., BCPS, AAHIVP [x]  Carly Sabat, Pharm.D. []  Elenor Quinones, Pharm.D.  Positive Urine culture, >/= 100,000 colonies Serratia Marcescens & Pseudomonas Aeruginosa Treated with Ciprofloxacin, organism sensitive to the same and no further patient follow-up is required at this time.  Dortha Kern 05/09/2014, 1:09 AM

## 2014-05-10 ENCOUNTER — Other Ambulatory Visit: Payer: Self-pay | Admitting: Urology

## 2014-05-10 ENCOUNTER — Ambulatory Visit: Payer: Medicare Other | Admitting: Family Medicine

## 2014-05-10 DIAGNOSIS — N139 Obstructive and reflux uropathy, unspecified: Secondary | ICD-10-CM | POA: Diagnosis not present

## 2014-05-10 DIAGNOSIS — R339 Retention of urine, unspecified: Secondary | ICD-10-CM | POA: Diagnosis not present

## 2014-05-10 DIAGNOSIS — N138 Other obstructive and reflux uropathy: Secondary | ICD-10-CM | POA: Diagnosis not present

## 2014-05-10 DIAGNOSIS — N401 Enlarged prostate with lower urinary tract symptoms: Secondary | ICD-10-CM | POA: Diagnosis not present

## 2014-05-11 ENCOUNTER — Encounter (HOSPITAL_BASED_OUTPATIENT_CLINIC_OR_DEPARTMENT_OTHER): Payer: Self-pay | Admitting: *Deleted

## 2014-05-12 ENCOUNTER — Encounter (HOSPITAL_BASED_OUTPATIENT_CLINIC_OR_DEPARTMENT_OTHER): Payer: Self-pay | Admitting: *Deleted

## 2014-05-12 ENCOUNTER — Ambulatory Visit (INDEPENDENT_AMBULATORY_CARE_PROVIDER_SITE_OTHER): Payer: Medicare Other | Admitting: Family Medicine

## 2014-05-12 ENCOUNTER — Encounter: Payer: Self-pay | Admitting: Family Medicine

## 2014-05-12 VITALS — BP 160/88 | HR 92 | Temp 97.2°F | Resp 16 | Wt 215.1 lb

## 2014-05-12 DIAGNOSIS — I10 Essential (primary) hypertension: Secondary | ICD-10-CM | POA: Diagnosis not present

## 2014-05-12 DIAGNOSIS — N401 Enlarged prostate with lower urinary tract symptoms: Secondary | ICD-10-CM | POA: Diagnosis not present

## 2014-05-12 DIAGNOSIS — R338 Other retention of urine: Secondary | ICD-10-CM

## 2014-05-12 DIAGNOSIS — N138 Other obstructive and reflux uropathy: Secondary | ICD-10-CM | POA: Diagnosis not present

## 2014-05-12 LAB — BASIC METABOLIC PANEL
BUN: 13 mg/dL (ref 6–23)
CALCIUM: 9.1 mg/dL (ref 8.4–10.5)
CO2: 24 meq/L (ref 19–32)
CREATININE: 1.1 mg/dL (ref 0.4–1.5)
Chloride: 102 mEq/L (ref 96–112)
GFR: 69.61 mL/min (ref >60.00)
GLUCOSE: 93 mg/dL (ref 70–99)
Potassium: 3.5 mEq/L (ref 3.5–5.1)
Sodium: 137 mEq/L (ref 135–145)

## 2014-05-12 MED ORDER — HYDROCHLOROTHIAZIDE 12.5 MG PO TABS: 12.5000 mg | ORAL_TABLET | Freq: Every day | ORAL | Status: DC

## 2014-05-12 MED ORDER — BENAZEPRIL HCL 40 MG PO TABS: 40.0000 mg | ORAL_TABLET | Freq: Every day | ORAL | Status: DC

## 2014-05-12 NOTE — Progress Notes (Signed)
Pre visit review using our clinic review tool, if applicable. No additional management support is needed unless otherwise documented below in the visit note. 

## 2014-05-12 NOTE — Progress Notes (Signed)
NPO AFTER MN. ARRIVE AT 0730. NEEDS ISTAT 8.  CURRENT EKG IN CHART AND EPIC. WILL TAKE SYNTHROID AND PREVACID AM DOS W/ SIPS OF WATER. REVIEWED [facility] GUIDELINES , WILL BRING MEDS.

## 2014-05-12 NOTE — Patient Instructions (Signed)
Follow up in 2 weeks to recheck BP STOP the combo pill START the Benazepril 40mg  daily (this is double what you are currently taking) START the Hydrochlorothiazide daily (this was part of the combo pill- same dose) Call with any questions or concerns You're in my thoughts and prayers on Monday!  You'll be great!

## 2014-05-12 NOTE — Assessment & Plan Note (Signed)
Deteriorated.  Pt's edema improved since stopping Amlodipine and starting HCTZ but BP is not well controlled.  Split med into components and increase Benazepril to 40mg  daily.  Check BMP.  Reviewed supportive care and red flags that should prompt return.  Pt expressed understanding and is in agreement w/ plan.

## 2014-05-12 NOTE — Progress Notes (Signed)
05/12/14 1056  OBSTRUCTIVE SLEEP APNEA  Have you ever been diagnosed with sleep apnea through a sleep study? No  Do you snore loudly (loud enough to be heard through closed doors)?  1  Do you often feel tired, fatigued, or sleepy during the daytime? 0  Has anyone observed you stop breathing during your sleep? 0  Do you have, or are you being treated for high blood pressure? 1  BMI more than 35 kg/m2? 0  Age over 70 years old? 1  Neck circumference greater than 40 cm/16 inches? 1  Gender: 1  Obstructive Sleep Apnea Score 5  Score 4 or greater  Results sent to PCP

## 2014-05-12 NOTE — Progress Notes (Signed)
   Subjective:    Patient ID: Stephen Morrison, male    DOB: 1946-04-02, 68 y.o.   MRN: [id number]  HPI HTN- chronic problem, BP elevated today.  Amlodipine was stopped at last visit and swelling has improved.  On HCTZ.  No CP, SOB, HAs, visual changes.  Last BMP showed low K+.  Urinary retention- pt has been to ER due to urinary retention from enlarged prostate.  Pt now has cath in place.  On Cipro.   Review of Systems For ROS see HPI     Objective:   Physical Exam  Vitals reviewed. Constitutional: He is oriented to person, place, and time. He appears well-developed and well-nourished. No distress.  HENT:  Head: Normocephalic and atraumatic.  Eyes: Conjunctivae and EOM are normal. Pupils are equal, round, and reactive to light.  Neck: Normal range of motion. Neck supple. No thyromegaly present.  Cardiovascular: Normal rate, regular rhythm, normal heart sounds and intact distal pulses.   No murmur heard. Pulmonary/Chest: Effort normal and breath sounds normal. No respiratory distress.  Abdominal: Soft. Bowel sounds are normal. He exhibits no distension.  Musculoskeletal: He exhibits no edema.  Lymphadenopathy:    He has no cervical adenopathy.  Neurological: He is alert and oriented to person, place, and time. No cranial nerve deficit.  Skin: Skin is warm and dry.  Psychiatric: He has a normal mood and affect. His behavior is normal.          Assessment & Plan:

## 2014-05-12 NOTE — Assessment & Plan Note (Signed)
New to provider.  Pt has catheter in place and surgery scheduled for Monday.  On Cipro.  Will follow.

## 2014-05-13 ENCOUNTER — Telehealth: Payer: Self-pay | Admitting: Family Medicine

## 2014-05-13 MED ORDER — EZETIMIBE-SIMVASTATIN 10-40 MG PO TABS: 1.0000 | ORAL_TABLET | ORAL | Status: DC

## 2014-05-13 NOTE — Telephone Encounter (Signed)
Med filled.  

## 2014-05-13 NOTE — Telephone Encounter (Signed)
Caller name: armour  Call back number: [phone] Pharmacy: [facility] (90 day refills)  Reason for call:  Pt needs the Rx ezetimibe-simvastatin (VYTORIN) 10-40 MG per tablet refilled and sent to pharmacy listed above.  The rx is a historical medication.

## 2014-05-14 NOTE — H&P (Signed)
History of Present Illness Stephen Morrison is currently 68 years of age. He has a long-standing history of bladder neck obstruction. He presented 2 weeks ago with acute urinary retention. Prior to that he had had long-standing of progressive voiding symptoms. There was no definitive evidence of prostatitis but he had been empirically started on antibiotics as well as an alpha-blocker. Unfortunately despite those maneuvers he did develop urinary retention. He failed his first attempted voiding here in our office.     Foley catheter was reinserted and the patient underwent urodynamic testing. The patient's first sensation to void occurred at 350 mL. Normal desire occurred at just around 500 mL. The bladder was stable. I pressure flow analysis the patient was not able to void. He generated a very weak detrusor contraction. Was not well sustained and he was unable to void. In summary the patient had a maximum capacity of close to 900 mL. There was some loss of compliance. Bladder sensation was reasonably intact. The patient was able to generate only a very weak detrusor contraction which was not well sustained. Given the inability to void and a 4 detrusor contraction and is very difficult to determine how much of his urinary retention secondary to obstruction or bladder dysfunction or primarily bladder dysfunction alone.    The patient subsequently developed fever and chills and presented to the emergency room several days ago. He was started empirically on ciprofloxacin and allowed to be managed as an outpatient. Urine cultures did come back as positive for pseudomonas as well as Serratia. Both organisms were sensitive to ciprofloxacin.   Past Medical History Problems  1. History of Gout (274.9) 2. History of heartburn (V12.79) 3. History of hypercholesterolemia (V12.29) 4. History of hypertension (V12.59)  Surgical History Problems  1. History of Surgery Of Male Genitalia Vasectomy 2. History of  Wrist Surgery  Current Meds 1. Alfuzosin HCl ER 10 MG Oral Tablet Extended Release 24 Hour;  Therapy: (Recorded:15Sep2015) to Recorded 2. Aspirin 81 MG Oral Tablet;  Therapy: (VPXTGGYI:[id number]) to Recorded 3. Benazepril-Hydrochlorothiazide 20-12.5 MG Oral Tablet;  Therapy: (Recorded:15Sep2015) to Recorded 4. Chlorzoxazone 500 MG Oral Tablet;  Therapy: (Recorded:15Sep2015) to Recorded 5. Cipro TABS;  Therapy: (Recorded:28Sep2015) to Recorded 6. Diclofenac Sodium 75 MG Oral Tablet Delayed Release;  Therapy: (Recorded:15Sep2015) to Recorded 7. Sulfamethoxazole-TMP DS TABS;  Therapy: (Recorded:15Sep2015) to Recorded 8. Synthroid TABS;  Therapy: (Recorded:15Sep2015) to Recorded 9. Vytorin TABS; 10mg ;  Therapy: (Recorded:03Jun2008) to Recorded  Allergies Medication  1. amoxicillin  Family History Problems  1. Family history of Alcohol Abuse : Father 2. Family history of Alzheimer's Disease : Mother 3. Family history of Family Health Status Number Of Children   one son and one daughter 37. Family history of hypercholesterolemia (V18.19) : Mother 5. Family history of hypertension (V17.49) : Mother 6. Family history of Hypercholesterolemia : Mother 58. Family history of Hypertension (V17.49) : Mother 8. Family history of Transient Ischemic Attack : Mother  Social History Problems  1. Alcohol Use   1-2 aday 2. History of Caffeine Use   2-3 aday 3. Family history of Death In The Family Father   57yrs, Alcoholism complications 4. Family history of Death In The Family Mother   71yrs, alzheimers dz 5. Former smoker (V15.82) 6. Marital History - Currently Married 7. Occupation:   retired 14. Tobacco Use (V15.82)   pk a day for 8 yrs but has been a non smoker 30plus yrs ago 9. Two children   1 son, 1 dtr  Review of Systems Genitourinary,  constitutional, skin, eye, otolaryngeal, hematologic/lymphatic, cardiovascular, pulmonary, endocrine, musculoskeletal,  gastrointestinal, neurological and psychiatric system(s) were reviewed and pertinent findings if present are noted.  Genitourinary: urinary frequency, feelings of urinary urgency, nocturia, difficulty starting the urinary stream, weak urinary stream, urinary stream starts and stops, incomplete emptying of bladder and erectile dysfunction.  Constitutional: feeling tired (fatigue).  Hematologic/Lymphatic: a tendency to easily bruise.    Vitals Vital Signs [Data Includes: Last 1 Day]  Recorded: 28Sep2015 10:58AM  Blood Pressure: 188 / 91 Temperature: 98 F Heart Rate: 70  Physical Exam Constitutional: Well nourished and well developed . No acute distress.  ENT:. The ears and nose are normal in appearance.  Neck: The appearance of the neck is normal and no neck mass is present.  Pulmonary: No respiratory distress and normal respiratory rhythm and effort.  Cardiovascular: Heart rate and rhythm are normal . No peripheral edema.  Abdomen: The abdomen is soft and nontender. No masses are palpated. No CVA tenderness. No hernias are palpable. No hepatosplenomegaly noted.  Rectal: Rectal exam demonstrates normal sphincter tone, no tenderness and no masses. Estimated prostate size is 2+. The prostate has no nodularity and is not tender. The left seminal vesicle is nonpalpable. The right seminal vesicle is nonpalpable. The perineum is normal on inspection.  Genitourinary: Examination of the penis demonstrates an indwelling catheter, but no discharge, no masses, no lesions and a normal meatus. The scrotum is without lesions. The right epididymis is palpably normal and non-tender. The left epididymis is palpably normal and non-tender. The right testis is non-tender and without masses. The left testis is non-tender and without masses.  Lymphatics: The femoral and inguinal nodes are not enlarged or tender.  Skin: Normal skin turgor, no visible rash and no visible skin lesions.  Neuro/Psych:. Mood and affect are  appropriate.    Results/Data Urine [Data Includes: Last 1 Day]   28Sep2015 COLOR YELLOW  APPEARANCE CLEAR  SPECIFIC GRAVITY 1.010  pH 6.5  GLUCOSE NEG mg/dL BILIRUBIN NEG  KETONE NEG mg/dL BLOOD TRACE  PROTEIN TRACE mg/dL UROBILINOGEN 0.2 mg/dL NITRITE NEG  LEUKOCYTE ESTERASE NEG  SQUAMOUS EPITHELIAL/HPF NONE SEEN  WBC 3-6 WBC/hpf RBC 0-2 RBC/hpf BACTERIA FEW  CRYSTALS NONE SEEN  CASTS NONE SEEN   Procedure  Procedure: Cystoscopy  Chaperone Present: laura.  Indication: Lower Urinary Tract Symptoms.  Informed Consent: Risks, benefits, and potential adverse events were discussed and informed consent was obtained. Specific risks including, but not limited to bleeding, infection, pain, allergic reaction etc. were explained.  Prep: The patient was prepped with betadine.  Anesthesia:. Local anesthesia was administered intraurethrally with 2% lidocaine jelly.  Antibiotic prophylaxis: Ciprofloxacin.  Procedure Note:  Urethral meatus:. No abnormalities.  Anterior urethra: No abnormalities.  Prostatic urethra:. Estimated length was 5.0 cm. There was visual obstruction of the prostatic urethra. The lateral and median prostatic lobes were enlarged.  Bladder:    Assessment Assessed  1. Benign prostatic hyperplasia with urinary obstruction (600.01,599.69) 2. Urinary retention (788.20)  Plan Health Maintenance  1. UA With REFLEX; [Do Not Release]; Status:Complete;   Done: [id number] 10:42AM Urinary retention  2. Follow-up NP/PA [facility]  Follow-up  Status: Hold For - Date of Service  Requested  for: 22Sep2015 3. Cysto; Status:Complete;   Done: [id number]  Discussion/Summary   Mr Meiner has ongoing urinary retention. He has failed several voiding trials. Cystoscopically, he does have a fairly large prostate with trilobar hyperplasia and a prostatic urethra of about 5 cm. There is evidence of visual obstruction. On  video urodynamics, he was unable to generate much of a detrusor  contraction, so it is unclear here how much of his ongoing urinary retention is due to obstruction plus bladder dysfunction, or mostly bladder dysfunction. He has had an indwelling catheter for several weeks. He has been on ciprofloxacin for the last 6-7 days. Clinically he has improved. I have reviewed his recent culture data. At this point, it would be helpful to try to unobstruct him to see if we can get him voiding spontaneously. He does have increased risk for ongoing urinary retention, given what may be poor bladder contractility. For that reason, if TURP is performed, I would recommend concurrent suprapubic tube. This could then be used temporarily or potentially permanently if he is unable to void successfully. He understands his other option would be in-and-out catheterization. I do think we should give him every effort to be able to try to pee spontaneously. I would anticipate Gyrus TURP with approximately an hour resection and then at least overnight stay in the hospital. We would anticipate Foley catheter removal from the urethra in 2-3 days after the procedure with continued use of the suprapubic tube as a backup and also to monitor postvoid residual. He will need to be covered with gentamicin at the time of surgery, given his culture data from a week ago.

## 2014-05-17 ENCOUNTER — Encounter (HOSPITAL_BASED_OUTPATIENT_CLINIC_OR_DEPARTMENT_OTHER)
Admission: RE | Disposition: A | Discharge status: Home / Self Care | Payer: Self-pay | Source: Ambulatory Visit | Attending: Urology

## 2014-05-17 ENCOUNTER — Encounter (HOSPITAL_BASED_OUTPATIENT_CLINIC_OR_DEPARTMENT_OTHER): Payer: Medicare Other | Admitting: Anesthesiology

## 2014-05-17 ENCOUNTER — Ambulatory Visit (HOSPITAL_BASED_OUTPATIENT_CLINIC_OR_DEPARTMENT_OTHER): Payer: Medicare Other | Admitting: Anesthesiology

## 2014-05-17 ENCOUNTER — Ambulatory Visit (HOSPITAL_BASED_OUTPATIENT_CLINIC_OR_DEPARTMENT_OTHER)
Admission: RE | Admit: 2014-05-17 | Discharge: 2014-05-18 | Disposition: A | Discharge status: Home / Self Care | Payer: Medicare Other | Source: Ambulatory Visit | Attending: Urology | Admitting: Urology

## 2014-05-17 ENCOUNTER — Encounter (HOSPITAL_BASED_OUTPATIENT_CLINIC_OR_DEPARTMENT_OTHER): Payer: Self-pay | Admitting: Anesthesiology

## 2014-05-17 DIAGNOSIS — N320 Bladder-neck obstruction: Secondary | ICD-10-CM | POA: Diagnosis not present

## 2014-05-17 DIAGNOSIS — M109 Gout, unspecified: Secondary | ICD-10-CM | POA: Insufficient documentation

## 2014-05-17 DIAGNOSIS — Z720 Tobacco use: Secondary | ICD-10-CM | POA: Diagnosis not present

## 2014-05-17 DIAGNOSIS — I10 Essential (primary) hypertension: Secondary | ICD-10-CM | POA: Insufficient documentation

## 2014-05-17 DIAGNOSIS — N400 Enlarged prostate without lower urinary tract symptoms: Secondary | ICD-10-CM | POA: Diagnosis not present

## 2014-05-17 DIAGNOSIS — R338 Other retention of urine: Secondary | ICD-10-CM | POA: Insufficient documentation

## 2014-05-17 DIAGNOSIS — E780 Pure hypercholesterolemia: Secondary | ICD-10-CM | POA: Diagnosis not present

## 2014-05-17 DIAGNOSIS — N401 Enlarged prostate with lower urinary tract symptoms: Secondary | ICD-10-CM | POA: Diagnosis present

## 2014-05-17 DIAGNOSIS — R339 Retention of urine, unspecified: Secondary | ICD-10-CM | POA: Diagnosis not present

## 2014-05-17 HISTORY — DX: Retention of urine, unspecified: R33.9

## 2014-05-17 HISTORY — PX: INSERTION OF SUPRAPUBIC CATHETER: SHX5870

## 2014-05-17 HISTORY — DX: Diverticulosis of large intestine without perforation or abscess without bleeding: K57.30

## 2014-05-17 HISTORY — DX: Benign prostatic hyperplasia without lower urinary tract symptoms: N40.0

## 2014-05-17 HISTORY — DX: Hypothyroidism, unspecified: E03.9

## 2014-05-17 HISTORY — PX: TRANSURETHRAL RESECTION OF PROSTATE: SHX73

## 2014-05-17 HISTORY — DX: Gastro-esophageal reflux disease without esophagitis: K21.9

## 2014-05-17 LAB — POCT I-STAT, CHEM 8
BUN: 11 mg/dL (ref 6–23)
CALCIUM ION: 1.17 mmol/L (ref 1.13–1.30)
Chloride: 101 mEq/L (ref 96–112)
Creatinine, Ser: 1 mg/dL (ref 0.50–1.35)
GLUCOSE: 113 mg/dL — AB (ref 70–99)
HEMATOCRIT: 49 % (ref 39.0–52.0)
Hemoglobin: 16.7 g/dL (ref 13.0–17.0)
Potassium: 3.4 mEq/L — ABNORMAL LOW (ref 3.7–5.3)
Sodium: 139 mEq/L (ref 137–147)
TCO2: 28 mmol/L (ref 0–100)

## 2014-05-17 SURGERY — TRANSURETHRAL RESECTION OF THE PROSTATE WITH GYRUS INSTRUMENTS
Anesthesia: General | Site: Prostate

## 2014-05-17 MED ORDER — STERILE WATER FOR IRRIGATION IR SOLN
Status: DC | PRN
  Administered 2014-05-17: 60 mL

## 2014-05-17 MED ORDER — DEXAMETHASONE SODIUM PHOSPHATE 4 MG/ML IJ SOLN
INTRAMUSCULAR | Status: DC | PRN
  Administered 2014-05-17: 10 mg via INTRAVENOUS

## 2014-05-17 MED ORDER — ONDANSETRON HCL 4 MG/2ML IJ SOLN
4.0000 mg | INTRAMUSCULAR | Status: DC | PRN
Start: 2014-05-17 — End: 2014-05-18
  Filled 2014-05-17: qty 2

## 2014-05-17 MED ORDER — FENTANYL CITRATE 0.05 MG/ML IJ SOLN
INTRAMUSCULAR | Status: DC | PRN
  Administered 2014-05-17 (×2): 12.5 ug via INTRAVENOUS
  Administered 2014-05-17 (×5): 25 ug via INTRAVENOUS
  Administered 2014-05-17: 50 ug via INTRAVENOUS
  Administered 2014-05-17: 25 ug via INTRAVENOUS
  Administered 2014-05-17 (×2): 12.5 ug via INTRAVENOUS
  Administered 2014-05-17: 25 ug via INTRAVENOUS

## 2014-05-17 MED ORDER — ACETAMINOPHEN 500 MG PO TABS
1000.0000 mg | ORAL_TABLET | Freq: Four times a day (QID) | ORAL | Status: DC | PRN
  Filled 2014-05-17: qty 2

## 2014-05-17 MED ORDER — METOCLOPRAMIDE HCL 5 MG/ML IJ SOLN
INTRAMUSCULAR | Status: DC | PRN
  Administered 2014-05-17: 10 mg via INTRAVENOUS

## 2014-05-17 MED ORDER — BENAZEPRIL HCL 40 MG PO TABS
40.0000 mg | ORAL_TABLET | Freq: Every day | ORAL | Status: DC
  Filled 2014-05-17: qty 1

## 2014-05-17 MED ORDER — ACETAMINOPHEN 10 MG/ML IV SOLN
INTRAVENOUS | Status: DC | PRN
  Administered 2014-05-17: 1000 mg via INTRAVENOUS

## 2014-05-17 MED ORDER — MORPHINE SULFATE 2 MG/ML IJ SOLN
2.0000 mg | INTRAMUSCULAR | Status: DC | PRN
  Filled 2014-05-17: qty 2

## 2014-05-17 MED ORDER — ONDANSETRON HCL 4 MG/2ML IJ SOLN
INTRAMUSCULAR | Status: DC | PRN
  Administered 2014-05-17: 4 mg via INTRAVENOUS

## 2014-05-17 MED ORDER — LACTATED RINGERS IV SOLN
INTRAVENOUS | Status: DC
  Administered 2014-05-17: 08:00:00 via INTRAVENOUS
  Filled 2014-05-17: qty 1000

## 2014-05-17 MED ORDER — MEPERIDINE HCL 25 MG/ML IJ SOLN
6.2500 mg | INTRAMUSCULAR | Status: DC | PRN
  Filled 2014-05-17: qty 1

## 2014-05-17 MED ORDER — COLCHICINE 0.6 MG PO TABS
0.6000 mg | ORAL_TABLET | Freq: Two times a day (BID) | ORAL | Status: DC | PRN
  Filled 2014-05-17: qty 1

## 2014-05-17 MED ORDER — LIDOCAINE HCL (CARDIAC) 20 MG/ML IV SOLN
INTRAVENOUS | Status: DC | PRN
  Administered 2014-05-17: 80 mg via INTRAVENOUS

## 2014-05-17 MED ORDER — GENTAMICIN SULFATE 40 MG/ML IJ SOLN
5.0000 mg/kg | INTRAVENOUS | Status: AC
  Administered 2014-05-17: 410 mg via INTRAVENOUS
  Filled 2014-05-17: qty 12.25

## 2014-05-17 MED ORDER — DOCUSATE SODIUM 100 MG PO CAPS
100.0000 mg | ORAL_CAPSULE | Freq: Two times a day (BID) | ORAL | Status: DC
  Administered 2014-05-17: 100 mg via ORAL
  Filled 2014-05-17: qty 1

## 2014-05-17 MED ORDER — LACTATED RINGERS IV SOLN
INTRAVENOUS | Status: DC | PRN
  Administered 2014-05-17 (×2): via INTRAVENOUS

## 2014-05-17 MED ORDER — FENTANYL CITRATE 0.05 MG/ML IJ SOLN
INTRAMUSCULAR | Status: AC
  Filled 2014-05-17: qty 6

## 2014-05-17 MED ORDER — LIDOCAINE HCL 2 % EX GEL
CUTANEOUS | Status: DC | PRN
  Administered 2014-05-17: 1 via URETHRAL

## 2014-05-17 MED ORDER — FENTANYL CITRATE 0.05 MG/ML IJ SOLN
25.0000 ug | INTRAMUSCULAR | Status: DC | PRN
  Filled 2014-05-17: qty 1

## 2014-05-17 MED ORDER — LORATADINE 10 MG PO TABS
10.0000 mg | ORAL_TABLET | Freq: Every day | ORAL | Status: DC
  Filled 2014-05-17: qty 1

## 2014-05-17 MED ORDER — LEVOTHYROXINE SODIUM 25 MCG PO TABS
25.0000 ug | ORAL_TABLET | Freq: Every day | ORAL | Status: DC
  Filled 2014-05-17: qty 1

## 2014-05-17 MED ORDER — SODIUM CHLORIDE 0.9 % IR SOLN
Status: DC | PRN
  Administered 2014-05-17: 6000 mL via INTRAVESICAL
  Administered 2014-05-17: 500 mL
  Administered 2014-05-17 (×3): 6000 mL

## 2014-05-17 MED ORDER — MIDAZOLAM HCL 2 MG/2ML IJ SOLN
INTRAMUSCULAR | Status: AC
  Filled 2014-05-17: qty 2

## 2014-05-17 MED ORDER — PROPOFOL 10 MG/ML IV BOLUS
INTRAVENOUS | Status: DC | PRN
  Administered 2014-05-17: 250 mg via INTRAVENOUS

## 2014-05-17 MED ORDER — GENTAMICIN SULFATE 40 MG/ML IJ SOLN
580.0000 mg | INTRAVENOUS | Status: DC
  Administered 2014-05-18: 580 mg via INTRAVENOUS
  Filled 2014-05-17: qty 14.5

## 2014-05-17 MED ORDER — POTASSIUM CL IN DEXTROSE 5% 20 MEQ/L IV SOLN
20.0000 meq | INTRAVENOUS | Status: DC
  Filled 2014-05-17: qty 1000

## 2014-05-17 MED ORDER — PROMETHAZINE HCL 25 MG/ML IJ SOLN
6.2500 mg | INTRAMUSCULAR | Status: DC | PRN
  Filled 2014-05-17: qty 1

## 2014-05-17 MED ORDER — CHLORZOXAZONE 500 MG PO TABS
500.0000 mg | ORAL_TABLET | Freq: Two times a day (BID) | ORAL | Status: DC | PRN
  Filled 2014-05-17: qty 1

## 2014-05-17 MED ORDER — PANTOPRAZOLE SODIUM 20 MG PO TBEC
20.0000 mg | DELAYED_RELEASE_TABLET | Freq: Every day | ORAL | Status: DC
  Filled 2014-05-17: qty 1

## 2014-05-17 MED ORDER — BUPIVACAINE HCL (PF) 0.25 % IJ SOLN
INTRAMUSCULAR | Status: DC | PRN
  Administered 2014-05-17: 9 mL

## 2014-05-17 MED ORDER — DOCUSATE SODIUM 100 MG PO CAPS
ORAL_CAPSULE | ORAL | Status: AC
  Filled 2014-05-17: qty 1

## 2014-05-17 MED ORDER — HYDROCHLOROTHIAZIDE 25 MG PO TABS
12.5000 mg | ORAL_TABLET | Freq: Every day | ORAL | Status: DC
  Filled 2014-05-17: qty 0.5

## 2014-05-17 MED ORDER — MIDAZOLAM HCL 5 MG/5ML IJ SOLN
INTRAMUSCULAR | Status: DC | PRN
  Administered 2014-05-17 (×2): 1 mg via INTRAVENOUS

## 2014-05-17 MED ORDER — HYDROCODONE-ACETAMINOPHEN 5-325 MG PO TABS
1.0000 | ORAL_TABLET | ORAL | Status: DC | PRN
  Filled 2014-05-17: qty 2

## 2014-05-17 MED ORDER — EZETIMIBE-SIMVASTATIN 10-40 MG PO TABS
1.0000 | ORAL_TABLET | ORAL | Status: DC
  Filled 2014-05-17: qty 1

## 2014-05-17 SURGICAL SUPPLY — 39 items
BAG DRAIN URO-CYSTO SKYTR STRL (DRAIN) ×4 IMPLANT
BAG URINE DRAINAGE (UROLOGICAL SUPPLIES) IMPLANT
BAG URINE LEG 19OZ MD ST LTX (BAG) IMPLANT
BAG URINE LEG 500ML (DRAIN) IMPLANT
BLADE SURG 15 STRL LF DISP TIS (BLADE) ×2 IMPLANT
BLADE SURG 15 STRL SS (BLADE) ×2
CANISTER SUCT LVC 12 LTR MEDI- (MISCELLANEOUS) ×12 IMPLANT
CATH FOLEY 2WAY SLVR  5CC 20FR (CATHETERS) ×2
CATH FOLEY 2WAY SLVR  5CC 22FR (CATHETERS)
CATH FOLEY 2WAY SLVR 30CC 22FR (CATHETERS) ×4 IMPLANT
CATH FOLEY 2WAY SLVR 5CC 20FR (CATHETERS) ×2 IMPLANT
CATH FOLEY 2WAY SLVR 5CC 22FR (CATHETERS) IMPLANT
CATH FOLEY 3WAY 30CC 22F (CATHETERS) IMPLANT
CLOTH BEACON ORANGE TIMEOUT ST (SAFETY) ×4 IMPLANT
DRAPE CAMERA CLOSED 9X96 (DRAPES) ×4 IMPLANT
ELECT BUTTON HF 24-28F 2 30DE (ELECTRODE) IMPLANT
ELECT LOOP MED HF 24F 12D CBL (CLIP) ×4 IMPLANT
ELECT REM PT RETURN 9FT ADLT (ELECTROSURGICAL)
ELECTRODE REM PT RTRN 9FT ADLT (ELECTROSURGICAL) IMPLANT
EVACUATOR MICROVAS BLADDER (UROLOGICAL SUPPLIES) IMPLANT
GLOVE BIO SURGEON STRL SZ7.5 (GLOVE) ×4 IMPLANT
GLOVE SURG SS PI 7.5 STRL IVOR (GLOVE) ×8 IMPLANT
GOWN STRL REUS W/TWL XL LVL3 (GOWN DISPOSABLE) ×8 IMPLANT
HOLDER FOLEY CATH W/STRAP (MISCELLANEOUS) IMPLANT
IV NS IRRIG 3000ML ARTHROMATIC (IV SOLUTION) ×32 IMPLANT
KIT SUPRAPUBIC CATH (MISCELLANEOUS) IMPLANT
NEEDLE HYPO 22GX1.5 SAFETY (NEEDLE) ×4 IMPLANT
PACK CYSTO (CUSTOM PROCEDURE TRAY) ×4 IMPLANT
PENCIL BUTTON HOLSTER BLD 10FT (ELECTRODE) IMPLANT
PLUG CATH AND CAP STER (CATHETERS) IMPLANT
SET ASPIRATION TUBING (TUBING) ×4 IMPLANT
SPONGE GAUZE 4X4 12PLY STER LF (GAUZE/BANDAGES/DRESSINGS) ×4 IMPLANT
SUT ETHILON 3 0 FSL (SUTURE) ×4 IMPLANT
SYR 30ML LL (SYRINGE) ×4 IMPLANT
SYRINGE IRR TOOMEY STRL 70CC (SYRINGE) ×4 IMPLANT
TAPE PAPER 3X10 WHT MICROPORE (GAUZE/BANDAGES/DRESSINGS) IMPLANT
TOWEL OR 17X24 6PK STRL BLUE (TOWEL DISPOSABLE) IMPLANT
WATER STERILE IRR 3000ML UROMA (IV SOLUTION) IMPLANT
WATER STERILE IRR 500ML POUR (IV SOLUTION) ×8 IMPLANT

## 2014-05-17 NOTE — Transfer of Care (Signed)
Immediate Anesthesia Transfer of Care Note  Patient: Stephen Morrison  Procedure(s) Performed: Procedure(s) (LRB): TRANSURETHRAL RESECTION OF THE PROSTATE WITH GYRUS INSTRUMENTS (N/A) INSERTION OF SUPRAPUBIC CATHETER (N/A)  Patient Location: PACU  Anesthesia Type: General  Level of Consciousness: awake, sedated, patient cooperative and responds to stimulation  Airway & Oxygen Therapy: Patient Spontanous Breathing and Patient connected to face mask oxygen  Post-op Assessment: Report given to PACU RN, Post -op Vital signs reviewed and stable and Patient moving all extremities  Post vital signs: Reviewed and stable  Complications: No apparent anesthesia complications

## 2014-05-17 NOTE — Anesthesia Procedure Notes (Signed)
Procedure Name: LMA Insertion Date/Time: 05/17/2014 9:13 AM Performed by: Justice Rocher Pre-anesthesia Checklist: Patient identified, Emergency Drugs available, Suction available and Patient being monitored Patient Re-evaluated:Patient Re-evaluated prior to inductionOxygen Delivery Method: Circle System Utilized Preoxygenation: Pre-oxygenation with 100% oxygen Intubation Type: IV induction Ventilation: Mask ventilation without difficulty LMA: LMA inserted LMA Size: 5.0 Number of attempts: 1 Airway Equipment and Method: bite block Placement Confirmation: positive ETCO2 Tube secured with: Tape Dental Injury: Teeth and Oropharynx as per pre-operative assessment

## 2014-05-17 NOTE — Discharge Instructions (Signed)

## 2014-05-17 NOTE — Op Note (Signed)
Preoperative diagnosis: Urinary retention and BPH Postoperative diagnosis: Same  Procedure: Gyrus TURP with suprapubic tube placement Surgeon: Bernestine Amass M.D.  Anesthesia: Gen.  Indications: Stephen Morrison is 68 years of age. He has a long-standing history of bladder neck obstruction. He presented several weeks ago with acute urinary retention. Cystoscopy revealed evidence of visual obstruction secondary to trilobar hyperplasia. The patient subsequently underwent urodynamic testing. The patient had evidence of decreased bladder contractility. We feel he has urinary retention is on the basis of outlet obstruction and now with bladder failure. We discussed several options. He elected to proceed with an attempted TURP along with concurrent suprapubic tube placed in the case he is still unable to void successfully after relief of the outlet obstruction. He understands and given his current bladder dysfunction he may not be able to void despite having removal of a significant portion of his prostate. Full informed consent has been obtained.     Technique and findings: Patient was brought the operating room where he had successful induction general anesthesia. Placed in lithotomy position and prepped and draped in usual manner. He received perioperative antibiotics and placement of PAS compression boots. Appropriate surgical timeout was performed. A 28 French continuous flow resectoscope sheath was placed. Saline was used as an irrigant along with gyrus instrumentation.we initially resected a median bar/small middle lobe. This was taken down to capsular fibers. Both ureteral orifices easily identified and preserved. We then worked anteriorly the posterior on the right lobe of the prostate out to the vera montanum. Contralateral lobe was done in analogous manner. Approximately 30 g of prostate tissue was resected. Hemostasis was good. At completion of the procedure a 24 [place] three-way Foley catheter was inserted  or use of continuous bladder irrigation with normal saline. Prostate chips were sent for permanent pathologic analysis. The patient had no obvious complications or problems. He was brought to PACU in stable condition.

## 2014-05-17 NOTE — Interval H&P Note (Signed)
History and Physical Interval Note:  05/17/2014 9:04 AM  Stephen Morrison  has presented today for surgery, with the diagnosis of BENIGN PROSTATIC HYPERPLASIA  The various methods of treatment have been discussed with the patient and family. After consideration of risks, benefits and other options for treatment, the patient has consented to  Procedure(s) with comments: [name] (N/A) - 2 HRS NEEDS OVERNIGHT BED [phone] MEDICARE-[id number] A AETNA-[id number] INSERTION OF SUPRAPUBIC CATHETER (N/A) as a surgical intervention .  The patient's history has been reviewed, patient examined, no change in status, stable for surgery.  I have reviewed the patient's chart and labs.  Questions were answered to the patient's satisfaction.     Atara Paterson S

## 2014-05-17 NOTE — Anesthesia Postprocedure Evaluation (Signed)
  Anesthesia Post-op Note  Patient: Stephen Morrison  Procedure(s) Performed: Procedure(s) (LRB): TRANSURETHRAL RESECTION OF THE PROSTATE WITH GYRUS INSTRUMENTS (N/A) INSERTION OF SUPRAPUBIC CATHETER (N/A)  Patient Location: PACU  Anesthesia Type: General  Level of Consciousness: awake and alert   Airway and Oxygen Therapy: Patient Spontanous Breathing  Post-op Pain: mild  Post-op Assessment: Post-op Vital signs reviewed, Patient's Cardiovascular Status Stable, Respiratory Function Stable, Patent Airway and No signs of Nausea or vomiting  Last Vitals:  Filed Vitals:   05/17/14 1145  BP: 157/80  Pulse: 68  Temp:   Resp: 20    Post-op Vital Signs: stable   Complications: No apparent anesthesia complications

## 2014-05-17 NOTE — Anesthesia Preprocedure Evaluation (Signed)
Anesthesia Evaluation  Patient identified by MRN, date of birth, ID band Patient awake    Reviewed: Allergy & Precautions, H&P , NPO status , Patient's Chart, lab work & pertinent test results  Airway Mallampati: II TM Distance: >3 FB Neck ROM: Full    Dental no notable dental hx.    Pulmonary former smoker,  breath sounds clear to auscultation  Pulmonary exam normal       Cardiovascular hypertension, Pt. on medications Rhythm:Regular Rate:Normal     Neuro/Psych negative neurological ROS  negative psych ROS   GI/Hepatic Neg liver ROS, GERD-  Medicated and Controlled,  Endo/Other  negative endocrine ROS  Renal/GU negative Renal ROS  negative genitourinary   Musculoskeletal negative musculoskeletal ROS (+)   Abdominal   Peds negative pediatric ROS (+)  Hematology negative hematology ROS (+)   Anesthesia Other Findings   Reproductive/Obstetrics negative OB ROS                           Anesthesia Physical Anesthesia Plan  ASA: II  Anesthesia Plan: General   Post-op Pain Management:    Induction: Intravenous  Airway Management Planned: LMA and Oral ETT  Additional Equipment:   Intra-op Plan:   Post-operative Plan: Extubation in OR  Informed Consent: I have reviewed the patients History and Physical, chart, labs and discussed the procedure including the risks, benefits and alternatives for the proposed anesthesia with the patient or authorized representative who has indicated his/her understanding and acceptance.   Dental advisory given  Plan Discussed with: CRNA  Anesthesia Plan Comments:         Anesthesia Quick Evaluation

## 2014-05-17 NOTE — Progress Notes (Signed)
ANTIBIOTIC CONSULT NOTE - INITIAL  Pharmacy Consult for Gentamicin Indication: UTI  Allergies  Allergen Reactions  . Amoxicillin Swelling and Rash    Tongue swells    Patient Measurements: Weight: 214 lb (97.07 kg) Dosing Body Weight: 83kg  Vital Signs: Temp: 97.8 F (36.6 C) (10/05 1015) Temp Source: Oral (10/05 0733) BP: 157/80 mmHg (10/05 1145) Pulse Rate: 68 (10/05 1145) Intake/Output from previous day:   Intake/Output from this shift: Total I/O In: 2980 [P.O.:480; I.V.:1200; Other:1300] Out: 4200 [Urine:4200]  Labs:  Recent Labs  05/17/14 0816  HGB 16.7  CREATININE 1.00   The CrCl is unknown because both a height and weight (above a minimum accepted value) are required for this calculation. No results found for this basename: Letta Median, VANCORANDOM, GENTTROUGH, GENTPEAK, GENTRANDOM, TOBRATROUGH, TOBRAPEAK, TOBRARND, AMIKACINPEAK, AMIKACINTROU, AMIKACIN,  in the last 72 hours   Microbiology: Recent Results (from the past 720 hour(s))  URINE CULTURE     Status: None   Collection Time    04/19/14 10:56 AM      Result Value Ref Range Status   Specimen Description URINE, CATHETERIZED   Final   Special Requests NONE   Final   Culture  Setup Time     Final   Value: 04/19/2014 14:20     Performed at [facility]     Final   Value: NO GROWTH     Performed at [facility]   Culture     Final   Value: NO GROWTH     Performed at [facility]   Report Status 04/20/2014 FINAL   Final  URINE CULTURE     Status: None   Collection Time    05/04/14  7:32 PM      Result Value Ref Range Status   Specimen Description URINE, RANDOM   Final   Special Requests NONE   Final   Culture  Setup Time     Final   Value: 05/05/2014 03:47     Performed at [facility]     Final   Value: >=100,000 COLONIES/ML     Performed at [facility]   Culture     Final   Value: [place]     Performed at [facility]   Report Status 05/07/2014 FINAL   Final   Organism ID, Bacteria PSEUDOMONAS AERUGINOSA   Final   Organism ID, Bacteria SERRATIA MARCESCENS   Final    Medical History: Past Medical History  Diagnosis Date  . Hyperlipidemia   . Hypertension   . PSA elevation   . Gout     STABLE PER PT ON 05-12-2014  . BPH (benign prostatic hyperplasia)   . Urinary retention   . Foley catheter in place   . Diverticulosis of colon   . Hypothyroidism   . GERD (gastroesophageal reflux disease)    Assessment: 68 y.o. Male with a long-standing history of bladder neck obstruction.  He presented several weeks ago with acute urinary retention.  The patient subsequently underwent urodynamic testing. The patient had evidence of decreased bladder contractility.  Patient presents today for TURP.  Pharmacy consulted to dose Gentamicin for UTI  Goal of Therapy:  Gentamicin per renal function  Plan:   Gent 580mg  IV q24h starting 10/6, patient received pre-op dose today  Follow renal function and course of therapy  Norva Karvonen level as needed  Dolly Rias RPh  05/17/2014, 1:07 PM Pager [phone]

## 2014-05-18 ENCOUNTER — Encounter: Payer: Self-pay | Admitting: Family Medicine

## 2014-05-18 ENCOUNTER — Encounter (HOSPITAL_BASED_OUTPATIENT_CLINIC_OR_DEPARTMENT_OTHER): Payer: Self-pay | Admitting: Urology

## 2014-05-18 DIAGNOSIS — N401 Enlarged prostate with lower urinary tract symptoms: Secondary | ICD-10-CM | POA: Diagnosis not present

## 2014-05-18 LAB — BASIC METABOLIC PANEL
Anion gap: 15 (ref 5–15)
BUN: 18 mg/dL (ref 6–23)
CHLORIDE: 97 meq/L (ref 96–112)
CO2: 23 mEq/L (ref 19–32)
CREATININE: 1.01 mg/dL (ref 0.50–1.35)
Calcium: 8.9 mg/dL (ref 8.4–10.5)
GFR calc Af Amer: 86 mL/min — ABNORMAL LOW (ref >90)
GFR calc non Af Amer: 74 mL/min — ABNORMAL LOW (ref >90)
Glucose, Bld: 145 mg/dL — ABNORMAL HIGH (ref 70–99)
Potassium: 4.3 mEq/L (ref 3.7–5.3)
Sodium: 135 mEq/L — ABNORMAL LOW (ref 137–147)

## 2014-05-18 LAB — HEMOGLOBIN AND HEMATOCRIT, BLOOD
HEMATOCRIT: 41.3 % (ref 39.0–52.0)
Hemoglobin: 14.4 g/dL (ref 13.0–17.0)

## 2014-05-18 MED ORDER — HYDROCODONE-ACETAMINOPHEN 5-325 MG PO TABS: 1.0000 | ORAL_TABLET | ORAL | Status: DC | PRN

## 2014-05-18 MED ORDER — CIPROFLOXACIN HCL 500 MG PO TABS: 500.0000 mg | ORAL_TABLET | Freq: Two times a day (BID) | ORAL | Status: DC

## 2014-05-18 NOTE — Telephone Encounter (Signed)
Pt notified of med change and expressed an understanding.

## 2014-05-18 NOTE — Progress Notes (Signed)
Pt w/o complaints during night w/ wife at bedside. CBI irrigating cont through sp catheter and draining through foley catheter.  Urine a dark pink in color; CBI irrigant increased slightly to lighten urine.  Pt using IS; encouraged po intake.  Dsg around sp cath site C, D and I.  B/P at 0100- 176/89 and at 0545 152/79.

## 2014-05-18 NOTE — Discharge Summary (Signed)
Patient ID: Stephen Morrison MRN: [id number] DOB/AGE: Sep 23, 1945 68 y.o.  Admit date: 05/17/2014 Discharge date: 05/18/2014    Discharge Diagnoses:   Present on Admission:  . BPH (benign prostatic hypertrophy) with urinary retention  Consults:  None    Discharge Medications:   Medication List         acetaminophen 500 MG tablet  Commonly known as:  TYLENOL  Take 1,000 mg by mouth every 6 (six) hours as needed for fever.     benazepril 40 MG tablet  Commonly known as:  LOTENSIN  Take 1 tablet (40 mg total) by mouth daily.     cetirizine 10 MG tablet  Commonly known as:  ZYRTEC  Take 10 mg by mouth as needed for allergies.     chlorzoxazone 500 MG tablet  Commonly known as:  PARAFON  Take 500 mg by mouth 2 (two) times daily as needed for muscle spasms.     ciprofloxacin 500 MG tablet  Commonly known as:  CIPRO  Take 1 tablet (500 mg total) by mouth 2 (two) times daily.     ciprofloxacin 500 MG tablet  Commonly known as:  CIPRO  Take 1 tablet (500 mg total) by mouth 2 (two) times daily.     colchicine 0.6 MG tablet  Take 0.6 mg by mouth 2 (two) times daily as needed (pain).     ezetimibe-simvastatin 10-40 MG per tablet  Commonly known as:  VYTORIN  Take 1 tablet by mouth See admin instructions. Monday Wednesday Friday     hydrochlorothiazide 12.5 MG tablet  Commonly known as:  HYDRODIURIL  Take 1 tablet (12.5 mg total) by mouth daily.     HYDROcodone-acetaminophen 5-325 MG per tablet  Commonly known as:  NORCO/VICODIN  Take 1-2 tablets by mouth every 4 (four) hours as needed for moderate pain.     ibuprofen 200 MG tablet  Commonly known as:  ADVIL,MOTRIN  Take 400 mg by mouth every 6 (six) hours as needed for fever.     lansoprazole 15 MG capsule  Commonly known as:  PREVACID  Take 15 mg by mouth every morning.     levothyroxine 25 MCG tablet  Commonly known as:  SYNTHROID, LEVOTHROID  Take 25 mcg by mouth daily before breakfast.     traMADol 50 MG  tablet  Commonly known as:  ULTRAM  Take 1 tablet (50 mg total) by mouth every 6 (six) hours as needed.         Significant Diagnostic Studies:  No results found.    Hospital Course:  Active Problems:   BPH (benign prostatic hypertrophy) with urinary retention  patient underwent uneventful TURP with placement of suprapubic tube. He was kept overnight for observation. Urine remained medium pink in color with good drainage from the catheter. Patient no obvious complications or issues. Blood work was all acceptablen the morning. He'll be sent home with a urethral catheter to a drainage bag and a plug in the suprapubic tube. Voiding trial is planned for tomorrow.  Day of Discharge BP 152/79  Pulse 79  Temp(Src) 98.4 F (36.9 C) (Oral)  Resp 18  Wt 97.07 kg (214 lb)  SpO2 92%  Well-developed well-nourished male in no acute distress. Respiratory effort normal abdomen soft and nontender Suprapubic tube site dry. Catheter draining light to medium pink urine. Extremities without edema or tenderness Results for orders placed during the hospital encounter of 05/17/14 (from the past 24 hour(s))  POCT I-STAT, CHEM 8  Status: Abnormal   Collection Time    05/17/14  8:16 AM      Result Value Ref Range   Sodium 139  137 - 147 mEq/L   Potassium 3.4 (*) 3.7 - 5.3 mEq/L   Chloride 101  96 - 112 mEq/L   BUN 11  6 - 23 mg/dL   Creatinine, Ser 1.00  0.50 - 1.35 mg/dL   Glucose, Bld 113 (*) 70 - 99 mg/dL   Calcium, Ion 1.17  1.13 - 1.30 mmol/L   TCO2 28  0 - 100 mmol/L   Hemoglobin 16.7  13.0 - 17.0 g/dL   HCT 49.0  39.0 - 73.4 %  BASIC METABOLIC PANEL     Status: Abnormal   Collection Time    05/18/14  5:00 AM      Result Value Ref Range   Sodium 135 (*) 137 - 147 mEq/L   Potassium 4.3  3.7 - 5.3 mEq/L   Chloride 97  96 - 112 mEq/L   CO2 23  19 - 32 mEq/L   Glucose, Bld 145 (*) 70 - 99 mg/dL   BUN 18  6 - 23 mg/dL   Creatinine, Ser 1.01  0.50 - 1.35 mg/dL   Calcium 8.9  8.4 -  10.5 mg/dL   GFR calc non Af Amer 74 (*) >90 mL/min   GFR calc Af Amer 86 (*) >90 mL/min   Anion gap 15  5 - 15  HEMOGLOBIN AND HEMATOCRIT, BLOOD     Status: None   Collection Time    05/18/14  5:00 AM      Result Value Ref Range   Hemoglobin 14.4  13.0 - 17.0 g/dL   HCT 41.3  39.0 - 52.0 %

## 2014-05-26 ENCOUNTER — Ambulatory Visit: Payer: Private Health Insurance - Indemnity | Admitting: Family Medicine

## 2014-05-27 ENCOUNTER — Ambulatory Visit (INDEPENDENT_AMBULATORY_CARE_PROVIDER_SITE_OTHER): Payer: Medicare Other | Admitting: Family Medicine

## 2014-05-27 ENCOUNTER — Encounter: Payer: Self-pay | Admitting: Family Medicine

## 2014-05-27 VITALS — BP 160/80 | HR 66 | Temp 97.9°F | Resp 16 | Wt 212.1 lb

## 2014-05-27 DIAGNOSIS — I10 Essential (primary) hypertension: Secondary | ICD-10-CM

## 2014-05-27 MED ORDER — VALSARTAN 320 MG PO TABS: 320.0000 mg | ORAL_TABLET | Freq: Every day | ORAL | Status: DC

## 2014-05-27 NOTE — Assessment & Plan Note (Signed)
BP is still not at goal despite increasing the Benazepril.  Reviewed recent BMP- labs normal.  Continue HCTZ.  Switch Benazepril to Valsartan 320mg  daily.  Pt is asymptomatic.  Reviewed supportive care and red flags that should prompt return.  Pt expressed understanding and is in agreement w/ plan.

## 2014-05-27 NOTE — Progress Notes (Signed)
Pre visit review using our clinic review tool, if applicable. No additional management support is needed unless otherwise documented below in the visit note. 

## 2014-05-27 NOTE — Progress Notes (Signed)
   Subjective:    Patient ID: [name], male    DOB: 1945/09/01, 68 y.o.   MRN: [id number]  HPI HTN- chronic problem.  At last visit Benazepril and HCTZ were split into their components to increase ACE but continue HCTZ at 12.5mg .  No CP, SOB, HAs, visual changes, edema.   Review of Systems For ROS see HPI     Objective:   Physical Exam  Vitals reviewed. Constitutional: He is oriented to person, place, and time. He appears well-developed and well-nourished. No distress.  HENT:  Head: Normocephalic and atraumatic.  Eyes: Conjunctivae and EOM are normal. Pupils are equal, round, and reactive to light.  Neck: Normal range of motion. Neck supple. No thyromegaly present.  Cardiovascular: Normal rate, regular rhythm, normal heart sounds and intact distal pulses.   No murmur heard. Pulmonary/Chest: Effort normal and breath sounds normal. No respiratory distress.  Abdominal: Soft. Bowel sounds are normal. He exhibits no distension.  Musculoskeletal: He exhibits no edema.  Lymphadenopathy:    He has no cervical adenopathy.  Neurological: He is alert and oriented to person, place, and time. No cranial nerve deficit.  Skin: Skin is warm and dry.  Psychiatric: He has a normal mood and affect. His behavior is normal.          Assessment & Plan:

## 2014-05-27 NOTE — Patient Instructions (Signed)
Follow up in 4-6 weeks to recheck BP Continue the HCTZ daily in the AM STOP the Benazepril START the Valsartan daily Call with any questions or concerns ENJOY [place]!!!

## 2014-06-04 DIAGNOSIS — R338 Other retention of urine: Secondary | ICD-10-CM | POA: Diagnosis not present

## 2014-06-04 DIAGNOSIS — N401 Enlarged prostate with lower urinary tract symptoms: Secondary | ICD-10-CM | POA: Diagnosis not present

## 2014-06-04 DIAGNOSIS — R339 Retention of urine, unspecified: Secondary | ICD-10-CM | POA: Diagnosis not present

## 2014-06-14 ENCOUNTER — Telehealth: Payer: Self-pay | Admitting: Family Medicine

## 2014-06-14 MED ORDER — HYDRALAZINE HCL 10 MG PO TABS: 10.0000 mg | ORAL_TABLET | Freq: Two times a day (BID) | ORAL | Status: DC

## 2014-06-14 NOTE — Telephone Encounter (Signed)
Please ask pt what BP has been running.  The Benazepril was not effective in controlling BP and valsartan is typically a stronger medication.  Depending on how high BPs are, we can add Hydralazine 10mg  BID and recheck BP in 2 weeks.

## 2014-06-14 NOTE — Telephone Encounter (Signed)
Please add Hydralazine 10mg  BID and have him check BP in 2 weeks.  We will likely need to titrate meds at his visit.

## 2014-06-14 NOTE — Telephone Encounter (Signed)
error                 dvanced Directives Encounter date 05/17/14 05/17/14 05/05/14  Most recent reading 7:53 AM 7:53 AM 11:13 PM  Does patient have an advance directive? Yes Yes No  Type of Advance Directive Healthcare Power of [place], Living will [place], Living will   Would patient like information on creating an advanced directive?   No - patient declined information  Chief Complaint    Medication Refill   Problem List LIPOMA  UNSPECIFIED HYPOTHYROIDISM  HYPERLIPIDEMIA  GOUT  Essential hypertension  FATTY LIVER DISEASE  PROSTATE SPECIFIC ANTIGEN, ELEVATED  Osteoarthritis  Urinary retention due to benign prostatic hyperplasia  BPH (benign prostatic hypertrophy) with urinary retention  Health MaintenancePostponedSoonDueLate       Topic Due Last Communication   INFLUENZA VACCINE 03/13/2014    COLONOSCOPY 01/30/2017    TETANUS/TDAP 09/23/2017    PNEUMOCOCCAL POLYSACCHARIDE VACCINE AGE 21 AND OVER Completed    ZOSTAVAX Completed   Reminders and Results None     Care Team and Communications Referring Provider   No referring provider set   PCPs Type  [name], MD General  Other Patient Care Team Members Relationship  Bernestine Amass, MD Consulting Physician  Recipients of Past [facility] Encounter - 05/05/2014    Bernestine Amass, MD 05/06/2014 Fax  [name], MD 05/06/2014 In [place], MD 05/06/2014 In [facility] Encounter - 05/04/2014    Bernestine Amass, MD 05/05/2014 Fax  [name], MD 05/05/2014 In [facility] Encounter - 04/19/2014    [name], MD 04/19/2014 In Basket   Date of Birth 08/07/1946 Allergies   Swelling!Rash^ [id number] RVAMP LAUNCH_OPTIONS;1 RVAMP " data-rvlinknum="[email or web address]">AmoxicillinSwelling, Rash  Medications Outpatient Medications (12) Hospital Medications (0) Clinic-Administered Medications (0)   acetaminophen (TYLENOL) 500 MG  tablet   cetirizine (ZYRTEC) 10 MG tablet   chlorzoxazone (PARAFON) 500 MG tablet   ciprofloxacin (CIPRO) 500 MG tablet   colchicine 0.6 MG tablet   ezetimibe-simvastatin (VYTORIN) 10-40 MG per tablet   hydrochlorothiazide (HYDRODIURIL) 12.5 MG tablet   ibuprofen (ADVIL,MOTRIN) 200 MG tablet   lansoprazole (PREVACID) 15 MG capsule   levothyroxine (SYNTHROID, LEVOTHROID) 25 MCG tablet   traMADol (ULTRAM) 50 MG tablet   valsartan (DIOVAN) 320 MG tablet      Mark as Reviewed Reviewed by MD on 05/27/2014.  Preferred Pharmacies    [id number] [place], [place] - [address] [phone] (Phone) [phone] (Fax)  [place], [place] [phone] (Phone) [phone] (Fax)  Immunizations/Injections    Influenza Split 07/02/2012, 06/27/2011  Influenza Whole 09/28/2009, 05/03/2008, 09/24/2007  Influenza, High Dose Seasonal PF 06/01/2013  Pneumococcal Polysaccharide-23 10/25/2008  Td 09/24/2007  Zoster 05/14/2012  Significant History/Details   Smoking: Former Smoker (Quit Date:08/13/1973), 1 ppd, 10 pack-years  Smokeless Tobacco: Former Systems developer (Quit Date:05/12/1974)  Alcohol: Yes  Comments: PT SIGNED DPR ALLOWING ACCESS TO PHI TO WIFE [place] AND CELL PHONE Primary: MEDICARE PART A AND B / Secondary: AETNA   No open orders  Preferred Language: English  Specialty CommentsShow AllReport No comments regarding your specialty  Family Comments None  My Last Outpatient Progress Note You have written no Outpatient Progress Notes for this patient

## 2014-06-14 NOTE — Addendum Note (Signed)
Addended by: Kris Hartmann on: 06/14/2014 12:01 PM   Modules accepted: Orders

## 2014-06-14 NOTE — Telephone Encounter (Signed)
Pt states that dr. Birdie Riddle changes his bp meds to valsartan (DIOVAN) 320 MG and his bp has been elevated every since would like a call back to tell him what his alternatives are

## 2014-06-14 NOTE — Telephone Encounter (Signed)
Pt states that dr. Birdie Riddle changed his bp meds to rx                       Advanced Directives Encounter date 05/17/14 05/17/14 05/05/14  Most recent reading 7:53 AM 7:53 AM 11:13 PM  Does patient have an advance directive? Yes Yes No  Type of Advance Directive Healthcare Power of [place], Living will [place], Living will   Would patient like information on creating an advanced directive?   No - patient declined information  Chief Complaint    Medication Refill   Problem List LIPOMA  UNSPECIFIED HYPOTHYROIDISM  HYPERLIPIDEMIA  GOUT  Essential hypertension  FATTY LIVER DISEASE  PROSTATE SPECIFIC ANTIGEN, ELEVATED  Osteoarthritis  Urinary retention due to benign prostatic hyperplasia  BPH (benign prostatic hypertrophy) with urinary retention  Health MaintenancePostponedSoonDueLate       Topic Due Last Communication   INFLUENZA VACCINE 03/13/2014    COLONOSCOPY 01/30/2017    TETANUS/TDAP 09/23/2017    PNEUMOCOCCAL POLYSACCHARIDE VACCINE AGE 10 AND OVER Completed    ZOSTAVAX Completed   Reminders and Results None     Care Team and Communications Referring Provider   No referring provider set   PCPs Type  [name], MD General  Other Patient Care Team Members Relationship  Bernestine Amass, MD Consulting Physician  Recipients of Past [facility] Encounter - 05/05/2014    Bernestine Amass, MD 05/06/2014 Fax  [name], MD 05/06/2014 In [place], MD 05/06/2014 In [facility] Encounter - 05/04/2014    Bernestine Amass, MD 05/05/2014 Fax  [name], MD 05/05/2014 In [facility] Encounter - 04/19/2014    [name], MD 04/19/2014 In Basket   Date of Birth February 06, 1946 Allergies   Swelling!Rash^ [phone] RVAMP LAUNCH_OPTIONS;1 RVAMP " data-rvlinknum="2">AmoxicillinSwelling, Rash  Medications Outpatient Medications (12) Hospital Medications (0)  Clinic-Administered Medications (0)   acetaminophen (TYLENOL) 500 MG tablet   cetirizine (ZYRTEC) 10 MG tablet   chlorzoxazone (PARAFON) 500 MG tablet   ciprofloxacin (CIPRO) 500 MG tablet   colchicine 0.6 MG tablet   ezetimibe-simvastatin (VYTORIN) 10-40 MG per tablet   hydrochlorothiazide (HYDRODIURIL) 12.5 MG tablet   ibuprofen (ADVIL,MOTRIN) 200 MG tablet   lansoprazole (PREVACID) 15 MG capsule   levothyroxine (SYNTHROID, LEVOTHROID) 25 MCG tablet   traMADol (ULTRAM) 50 MG tablet   valsartan (DIOVAN) 320 MG tablet      Mark as Reviewed Reviewed by MD on 05/27/2014.  Preferred Pharmacies    [id number] - [place], [place] - [place] [phone] (Phone) [phone] (Fax)  [place], [place] [phone] (Phone) [phone] (Fax)  Immunizations/Injections    Influenza Split 07/02/2012, 06/27/2011  Influenza Whole 09/28/2009, 05/03/2008, 09/24/2007  Influenza, High Dose Seasonal PF 06/01/2013  Pneumococcal Polysaccharide-23 10/25/2008  Td 09/24/2007  Zoster 05/14/2012  Significant History/Details   Smoking: Former Smoker (Quit Date:08/13/1973), 1 ppd, 10 pack-years  Smokeless Tobacco: Former Systems developer (Quit Date:05/12/1974)  Alcohol: Yes  Comments: PT SIGNED DPR ALLOWING ACCESS TO PHI TO WIFE [place] AND CELL PHONE Primary: MEDICARE PART A AND B / Secondary: AETNA   No open orders  Preferred Language: English  Specialty CommentsShow AllReport No comments regarding your specialty  Family Comments None  My Last Outpatient Progress Note You have written no  Outpatient Progress Notes for this patient

## 2014-06-14 NOTE — Telephone Encounter (Signed)
Spoke with pt. States his BP readings are as follows:   Friday Morning 178/101 Friday Evening  176/94  Sunday Morning  184/92 Sunday Afternoon  196/90  Monday morning  188/105

## 2014-06-14 NOTE — Telephone Encounter (Signed)
Pt notified, med filled and appt scheduled.

## 2014-06-23 ENCOUNTER — Ambulatory Visit: Payer: Medicare Other | Admitting: Family Medicine

## 2014-06-25 ENCOUNTER — Encounter: Payer: Self-pay | Admitting: Family Medicine

## 2014-06-25 DIAGNOSIS — I10 Essential (primary) hypertension: Secondary | ICD-10-CM

## 2014-06-28 NOTE — Addendum Note (Signed)
Addended by: [name] on: 06/28/2014 02:26 PM   Modules accepted: Orders

## 2014-06-28 NOTE — Telephone Encounter (Signed)
Referral and [place] both ordered.

## 2014-06-30 ENCOUNTER — Ambulatory Visit (INDEPENDENT_AMBULATORY_CARE_PROVIDER_SITE_OTHER): Payer: Medicare Other | Admitting: General Practice

## 2014-06-30 ENCOUNTER — Encounter: Payer: Self-pay | Admitting: Family Medicine

## 2014-06-30 ENCOUNTER — Ambulatory Visit (INDEPENDENT_AMBULATORY_CARE_PROVIDER_SITE_OTHER): Payer: Medicare Other | Admitting: Family Medicine

## 2014-06-30 VITALS — BP 180/72 | HR 66 | Temp 98.1°F | Resp 16 | Wt 215.1 lb

## 2014-06-30 DIAGNOSIS — Z23 Encounter for immunization: Secondary | ICD-10-CM

## 2014-06-30 DIAGNOSIS — I10 Essential (primary) hypertension: Secondary | ICD-10-CM

## 2014-06-30 MED ORDER — AMLODIPINE BESYLATE 5 MG PO TABS: 5.0000 mg | ORAL_TABLET | Freq: Every day | ORAL | Status: DC

## 2014-06-30 NOTE — Patient Instructions (Signed)
Follow up in 4-6 weeks to recheck BP Continue the Valsartan, the Hydrochlorothiazide, the Hydralazine, and ADD the Amlodipine daily Continue to drink plenty of fluids Limit your salt intake Call with any questions or concerns Hang in there!  We'll figure this out! HAPPY HOLIDAYS!!!

## 2014-06-30 NOTE — Progress Notes (Signed)
Pre visit review using our clinic review tool, if applicable. No additional management support is needed unless otherwise documented below in the visit note. 

## 2014-06-30 NOTE — Progress Notes (Signed)
   Subjective:    Patient ID: Stephen Morrison, male    DOB: 1946-05-13, 68 y.o.   MRN: [id number]  HPI HTN- chronic problem, BP is again elevated and difficult to control.  Now on HCTZ, Valsartan, and Hydralazine.  Denies edema.  No CP, SOB, HAs, visual changes, edema.  Played 18 holes yesterday.   Review of Systems For ROS see HPI     Objective:   Physical Exam  Constitutional: He is oriented to person, place, and time. He appears well-developed and well-nourished. No distress.  HENT:  Head: Normocephalic and atraumatic.  Eyes: Conjunctivae and EOM are normal. Pupils are equal, round, and reactive to light.  Neck: Normal range of motion. Neck supple. No thyromegaly present.  Cardiovascular: Normal rate, regular rhythm, normal heart sounds and intact distal pulses.   No murmur heard. Pulmonary/Chest: Effort normal and breath sounds normal. No respiratory distress.  Abdominal: Soft. Bowel sounds are normal. He exhibits no distension.  Musculoskeletal: He exhibits no edema.  Lymphadenopathy:    He has no cervical adenopathy.  Neurological: He is alert and oriented to person, place, and time. No cranial nerve deficit.  Skin: Skin is warm and dry.  Psychiatric: He has a normal mood and affect. His behavior is normal.  Vitals reviewed.         Assessment & Plan:

## 2014-06-30 NOTE — Assessment & Plan Note (Signed)
Chronic problem.  BP has been difficult to control recently- concerning for renal artery stenosis.  [place] scheduled for Friday.  Restart low dose Amlodipine as this has worked well for pt in the past (but caused swelling).  Now that pt is on HCTZ, swelling may be less of an issue.  Pt remains asymptomatic.  Will continue to follow closely.

## 2014-07-02 ENCOUNTER — Ambulatory Visit (HOSPITAL_COMMUNITY): Payer: Medicare Other | Attending: Family Medicine | Admitting: *Deleted

## 2014-07-02 DIAGNOSIS — I10 Essential (primary) hypertension: Secondary | ICD-10-CM | POA: Insufficient documentation

## 2014-07-02 DIAGNOSIS — E785 Hyperlipidemia, unspecified: Secondary | ICD-10-CM | POA: Diagnosis not present

## 2014-07-02 NOTE — Progress Notes (Signed)
Renal Artery Duplex Performed

## 2014-07-12 ENCOUNTER — Encounter: Payer: Self-pay | Admitting: Family Medicine

## 2014-07-12 ENCOUNTER — Ambulatory Visit (INDEPENDENT_AMBULATORY_CARE_PROVIDER_SITE_OTHER): Payer: Medicare Other | Admitting: Family Medicine

## 2014-07-12 VITALS — BP 158/88 | HR 68 | Temp 98.4°F | Resp 16 | Wt 216.5 lb

## 2014-07-12 DIAGNOSIS — I10 Essential (primary) hypertension: Secondary | ICD-10-CM

## 2014-07-12 MED ORDER — VALSARTAN 320 MG PO TABS: 320.0000 mg | ORAL_TABLET | Freq: Every day | ORAL | Status: DC

## 2014-07-12 NOTE — Assessment & Plan Note (Signed)
Pt's BP improved since starting Amlodipine but pt was confused after last visit and stopped the Valsartan and Hydralazine.  Will restart Valsartan so pt will be on Amlodipine, HCTZ and valsartan daily.  Renal US was WNL.  Suspect this will get pt's BP to goal so he can cancel appt w/ cardiology.  Will continue to follow closely.  Pt expressed understanding and is in agreement w/ plan.

## 2014-07-12 NOTE — Patient Instructions (Signed)
Follow up in 3-4 weeks to recheck BP Cancel your cardiology appt Continue the Amlodipine and Hydrochlorothiazide (water pill) RESTART the Valsartan daily (prescription sent to pharmacy) Call with any questions or concerns Angela Nevin Christmas!!!

## 2014-07-12 NOTE — Progress Notes (Signed)
Pre visit review using our clinic review tool, if applicable. No additional management support is needed unless otherwise documented below in the visit note. 

## 2014-07-12 NOTE — Progress Notes (Signed)
   Subjective:    Patient ID: Stephen Morrison, male    DOB: 05-22-1946, 68 y.o.   MRN: [id number]  HPI HTN- chronic problem.  Has been difficult to control.  BP improved considerably after adding Amlodipine 5mg  to HCTZ, Valsartan, Hydralazine.  There was a 'misunderstanding' and pt stopped all BP meds w/ exception of HCTZ and amlodipine.  No LE edema after restarting Amlodipine.  No CP, SOB, HAs, visual changes.   Review of Systems For ROS see HPI     Objective:   Physical Exam  Constitutional: He is oriented to person, place, and time. He appears well-developed and well-nourished. No distress.  HENT:  Head: Normocephalic and atraumatic.  Eyes: Conjunctivae and EOM are normal. Pupils are equal, round, and reactive to light.  Neck: Normal range of motion. Neck supple. No thyromegaly present.  Cardiovascular: Normal rate, regular rhythm, normal heart sounds and intact distal pulses.   No murmur heard. Pulmonary/Chest: Effort normal and breath sounds normal. No respiratory distress.  Abdominal: Soft. Bowel sounds are normal. He exhibits no distension.  Musculoskeletal: He exhibits no edema.  Lymphadenopathy:    He has no cervical adenopathy.  Neurological: He is alert and oriented to person, place, and time. No cranial nerve deficit.  Skin: Skin is warm and dry.  Psychiatric: He has a normal mood and affect. His behavior is normal.  Vitals reviewed.         Assessment & Plan:

## 2014-07-23 ENCOUNTER — Ambulatory Visit: Payer: Medicare Other | Admitting: Internal Medicine

## 2014-08-02 ENCOUNTER — Encounter: Payer: Self-pay | Admitting: Family Medicine

## 2014-08-02 ENCOUNTER — Ambulatory Visit (INDEPENDENT_AMBULATORY_CARE_PROVIDER_SITE_OTHER): Payer: Medicare Other | Admitting: Family Medicine

## 2014-08-02 VITALS — BP 148/82 | HR 60 | Temp 98.2°F | Resp 16 | Wt 221.5 lb

## 2014-08-02 DIAGNOSIS — I10 Essential (primary) hypertension: Secondary | ICD-10-CM | POA: Diagnosis not present

## 2014-08-02 MED ORDER — VALSARTAN-HYDROCHLOROTHIAZIDE 320-12.5 MG PO TABS: 1.0000 | ORAL_TABLET | Freq: Every day | ORAL | Status: DC

## 2014-08-02 MED ORDER — AMLODIPINE BESYLATE 5 MG PO TABS: 5.0000 mg | ORAL_TABLET | Freq: Every day | ORAL | Status: DC

## 2014-08-02 MED ORDER — EZETIMIBE-SIMVASTATIN 10-40 MG PO TABS: 1.0000 | ORAL_TABLET | ORAL | Status: DC

## 2014-08-02 NOTE — Patient Instructions (Signed)
Schedule your complete physical in 6 months Continue your Valsartan, HCTZ, Amlodipine daily- the Valsartan and HCTZ will be combined in 1 pill when it arrives from mail-order We'll notify you of your lab results and make any changes if needed Keep up the good work!  You look great! Happy Holidays!!!

## 2014-08-02 NOTE — Progress Notes (Signed)
   Subjective:    Patient ID: Stephen Morrison, male    DOB: 1946/05/28, 68 y.o.   MRN: [id number]  HPI HTN- chronic problem.  No CP, HAs, SOB, visual changes, edema.  Pt is now taking both HCTZ, Diovan, and amlodipine as directed.   Review of Systems For ROS see HPI     Objective:   Physical Exam  Constitutional: He is oriented to person, place, and time. He appears well-developed and well-nourished. No distress.  HENT:  Head: Normocephalic and atraumatic.  Eyes: Conjunctivae and EOM are normal. Pupils are equal, round, and reactive to light.  Neck: Normal range of motion. Neck supple. No thyromegaly present.  Cardiovascular: Normal rate, regular rhythm, normal heart sounds and intact distal pulses.   No murmur heard. Pulmonary/Chest: Effort normal and breath sounds normal. No respiratory distress.  Abdominal: Soft. Bowel sounds are normal. He exhibits no distension.  Musculoskeletal: He exhibits no edema.  Lymphadenopathy:    He has no cervical adenopathy.  Neurological: He is alert and oriented to person, place, and time. No cranial nerve deficit.  Skin: Skin is warm and dry.  Psychiatric: He has a normal mood and affect. His behavior is normal.          Assessment & Plan:

## 2014-08-02 NOTE — Progress Notes (Signed)
Pre visit review using our clinic review tool, if applicable. No additional management support is needed unless otherwise documented below in the visit note. 

## 2014-08-03 NOTE — Assessment & Plan Note (Signed)
Improved now that pt is taking meds correctly.  Will combine Valsartan and HCTZ into 1 tab.  Continue amlodipine.  Check BMP.  No anticipated med changes.

## 2014-08-10 NOTE — Progress Notes (Signed)
Called pt and lmovm to return. Will call again tomorrow.

## 2014-08-16 ENCOUNTER — Other Ambulatory Visit: Payer: Medicare Other

## 2014-08-16 LAB — BASIC METABOLIC PANEL
BUN: 13 mg/dL (ref 6–23)
CHLORIDE: 100 meq/L (ref 96–112)
CO2: 29 meq/L (ref 19–32)
Calcium: 9 mg/dL (ref 8.4–10.5)
Creatinine, Ser: 1.1 mg/dL (ref 0.4–1.5)
GFR: 74.17 mL/min (ref >60.00)
GLUCOSE: 112 mg/dL — AB (ref 70–99)
POTASSIUM: 3.6 meq/L (ref 3.5–5.1)
Sodium: 137 mEq/L (ref 135–145)

## 2014-09-17 DIAGNOSIS — N401 Enlarged prostate with lower urinary tract symptoms: Secondary | ICD-10-CM | POA: Diagnosis not present

## 2014-09-17 DIAGNOSIS — R339 Retention of urine, unspecified: Secondary | ICD-10-CM | POA: Diagnosis not present

## 2014-11-24 DIAGNOSIS — H2513 Age-related nuclear cataract, bilateral: Secondary | ICD-10-CM | POA: Diagnosis not present

## 2014-11-24 DIAGNOSIS — H40013 Open angle with borderline findings, low risk, bilateral: Secondary | ICD-10-CM | POA: Diagnosis not present

## 2014-11-24 DIAGNOSIS — D2312 Other benign neoplasm of skin of left eyelid, including canthus: Secondary | ICD-10-CM | POA: Diagnosis not present

## 2014-11-24 DIAGNOSIS — H25013 Cortical age-related cataract, bilateral: Secondary | ICD-10-CM | POA: Diagnosis not present

## 2014-11-29 ENCOUNTER — Telehealth: Payer: Self-pay | Admitting: Family Medicine

## 2014-11-29 NOTE — Telephone Encounter (Signed)
Pre visit letter sent  °

## 2014-12-15 ENCOUNTER — Other Ambulatory Visit: Payer: Self-pay | Admitting: General Practice

## 2014-12-15 MED ORDER — LEVOTHYROXINE SODIUM 25 MCG PO TABS: 25.0000 ug | ORAL_TABLET | Freq: Every day | ORAL | Status: DC

## 2014-12-17 ENCOUNTER — Encounter: Payer: Medicare Other | Admitting: Family Medicine

## 2014-12-18 ENCOUNTER — Encounter: Payer: Self-pay | Admitting: Family Medicine

## 2014-12-20 MED ORDER — LEVOTHYROXINE SODIUM 25 MCG PO TABS: 25.0000 ug | ORAL_TABLET | Freq: Every day | ORAL | Status: DC

## 2014-12-20 NOTE — Telephone Encounter (Signed)
Med filled.  

## 2015-02-07 ENCOUNTER — Encounter: Payer: Self-pay | Admitting: Family Medicine

## 2015-02-07 MED ORDER — SULFAMETHOXAZOLE-TRIMETHOPRIM 800-160 MG PO TABS: 1.0000 | ORAL_TABLET | Freq: Two times a day (BID) | ORAL | Status: DC

## 2015-02-07 NOTE — Telephone Encounter (Signed)
Med filled and pt notified.  

## 2015-02-18 ENCOUNTER — Other Ambulatory Visit: Payer: Self-pay | Admitting: Family Medicine

## 2015-02-18 NOTE — Telephone Encounter (Signed)
Med filled.  

## 2015-03-30 ENCOUNTER — Telehealth: Payer: Self-pay | Admitting: Behavioral Health

## 2015-03-30 ENCOUNTER — Encounter: Payer: Self-pay | Admitting: Behavioral Health

## 2015-03-30 NOTE — Telephone Encounter (Signed)
Pre-Visit Call completed with patient and chart updated.   Pre-Visit Info documented in Specialty Comments under [employer].    

## 2015-03-31 ENCOUNTER — Encounter: Payer: Self-pay | Admitting: Family Medicine

## 2015-03-31 ENCOUNTER — Ambulatory Visit (INDEPENDENT_AMBULATORY_CARE_PROVIDER_SITE_OTHER): Payer: Medicare Other | Admitting: Family Medicine

## 2015-03-31 VITALS — BP 134/80 | HR 82 | Temp 98.0°F | Resp 16 | Ht 70.0 in | Wt 223.1 lb

## 2015-03-31 DIAGNOSIS — I10 Essential (primary) hypertension: Secondary | ICD-10-CM

## 2015-03-31 DIAGNOSIS — Z0000 Encounter for general adult medical examination without abnormal findings: Secondary | ICD-10-CM | POA: Diagnosis not present

## 2015-03-31 DIAGNOSIS — Z23 Encounter for immunization: Secondary | ICD-10-CM | POA: Diagnosis not present

## 2015-03-31 DIAGNOSIS — E038 Other specified hypothyroidism: Secondary | ICD-10-CM | POA: Diagnosis not present

## 2015-03-31 DIAGNOSIS — E785 Hyperlipidemia, unspecified: Secondary | ICD-10-CM

## 2015-03-31 NOTE — Progress Notes (Signed)
   Subjective:    Patient ID: Stephen Morrison, male    DOB: [birth year], 69 y.o.   MRN: [id number]  HPI Here today for CPE.  Risk Factors: HTN- chronic problem, on Amlodipine, Valsartan-HCTZ.  Denies CP, SOB, HAs, visual changes, edema. Hyperlipidemia- chronic problem, on Vytorin.  Denies abd pain, N/V, myalgias. Hypothyroid- chronic problem, on Levothyroxine.  Denies fatigue, changes to skin/hair/nails Physical Activity: active, golfing regularly Fall Risk: low Depression: denies Hearing: normal to conversational tones, mildly decreased to whispered voice ADL's: independent Cognitive: normal linear thought process, memory and attention intact Home Safety: safe at home, lives w/ wife Height, Weight, BMI, Visual Acuity: see vitals, vision corrected to 20/20 w/ glasses Counseling: UTD on colonoscopy until 2018, following w/ Dr Risa Grill yearly Care Team reviewed and updated Labs Ordered: See A&P Care Plan: See A&P    Review of Systems Patient reports no vision/hearing changes, anorexia, fever ,adenopathy, persistant/recurrent hoarseness, swallowing issues, chest pain, palpitations, edema, persistant/recurrent cough, hemoptysis, dyspnea (rest,exertional, paroxysmal nocturnal), gastrointestinal  bleeding (melena, rectal bleeding), abdominal pain, excessive heart burn, GU symptoms (dysuria, hematuria, voiding/incontinence issues) syncope, focal weakness, memory loss, numbness & tingling, skin/hair/nail changes, depression, anxiety, abnormal bruising/bleeding, musculoskeletal symptoms/signs.     Objective:   Physical Exam General Appearance:    Alert, cooperative, no distress, appears stated age  Head:    Normocephalic, without obvious abnormality, atraumatic  Eyes:    PERRL, conjunctiva/corneas clear, EOM's intact, fundi    benign, both eyes       Ears:    Normal TM's and external ear canals, both ears  Nose:   Nares normal, septum midline, mucosa normal, no drainage   or sinus tenderness    Throat:   Lips, mucosa, and tongue normal; teeth and gums normal  Neck:   Supple, symmetrical, trachea midline, no adenopathy;       thyroid:  No enlargement/tenderness/nodules  Back:     Symmetric, no curvature, ROM normal, no CVA tenderness  Lungs:     Clear to auscultation bilaterally, respirations unlabored  Chest wall:    No tenderness or deformity  Heart:    Regular rate and rhythm, S1 and S2 normal, no murmur, rub   or gallop  Abdomen:     Soft, non-tender, bowel sounds active all four quadrants,    no masses, no organomegaly  Genitalia:    Deferred to urology  Rectal:    Extremities:   Extremities normal, atraumatic, no cyanosis or edema  Pulses:   2+ and symmetric all extremities  Skin:   Skin color, texture, turgor normal, no rashes or lesions  Lymph nodes:   Cervical, supraclavicular, and axillary nodes normal  Neurologic:   CNII-XII intact. Normal strength, sensation and reflexes      throughout          Assessment & Plan:

## 2015-03-31 NOTE — Assessment & Plan Note (Signed)
Pt's PE WNL.  UTD on colonoscopy (due 2018), UTD on urology.  Written screening schedule updated and given to pt.  prevnar updated today.  Check labs.  Anticipatory guidance provided.

## 2015-03-31 NOTE — Assessment & Plan Note (Signed)
Chronic problem.  Adequate control.  Asymptomatic.  Check labs.  No anticipated med changes 

## 2015-03-31 NOTE — Assessment & Plan Note (Signed)
Chronic problem.  Asymptomatic.  Check labs but no anticipated med changes.

## 2015-03-31 NOTE — Progress Notes (Signed)
Pre visit review using our clinic review tool, if applicable. No additional management support is needed unless otherwise documented below in the visit note. 

## 2015-03-31 NOTE — Assessment & Plan Note (Signed)
Chronic problem.  Tolerating statin w/o difficulty.  Check labs.  Adjust meds prn  

## 2015-03-31 NOTE — Patient Instructions (Signed)
Follow up in 6 months to recheck BP and cholesterol We'll notify you of your lab results and make any changes if needed Keep up the good work on healthy diet and regular exercise You are not due for colonoscopy until 2018 Call with any questions or concerns Have a great trip to [place]!!!

## 2015-04-01 ENCOUNTER — Other Ambulatory Visit: Payer: Self-pay | Admitting: Family Medicine

## 2015-04-01 DIAGNOSIS — R7989 Other specified abnormal findings of blood chemistry: Secondary | ICD-10-CM

## 2015-04-01 DIAGNOSIS — R945 Abnormal results of liver function studies: Principal | ICD-10-CM

## 2015-04-01 LAB — BASIC METABOLIC PANEL
BUN: 14 mg/dL (ref 6–23)
CHLORIDE: 101 meq/L (ref 96–112)
CO2: 27 mEq/L (ref 19–32)
CREATININE: 1.04 mg/dL (ref 0.40–1.50)
Calcium: 9.3 mg/dL (ref 8.4–10.5)
GFR: 74.86 mL/min (ref >60.00)
Glucose, Bld: 83 mg/dL (ref 70–99)
Potassium: 3.3 mEq/L — ABNORMAL LOW (ref 3.5–5.1)
SODIUM: 140 meq/L (ref 135–145)

## 2015-04-01 LAB — HEPATIC FUNCTION PANEL
ALT: 93 U/L — ABNORMAL HIGH (ref 0–53)
AST: 60 U/L — AB (ref 0–37)
Albumin: 4.5 g/dL (ref 3.5–5.2)
Alkaline Phosphatase: 44 U/L (ref 39–117)
BILIRUBIN DIRECT: 0.2 mg/dL (ref 0.0–0.3)
BILIRUBIN TOTAL: 0.9 mg/dL (ref 0.2–1.2)
Total Protein: 7.5 g/dL (ref 6.0–8.3)

## 2015-04-01 LAB — LIPID PANEL
CHOL/HDL RATIO: 5
Cholesterol: 170 mg/dL (ref 0–200)
HDL: 35.7 mg/dL — AB (ref >39.00)
LDL Cholesterol: 100 mg/dL — ABNORMAL HIGH (ref 0–99)
NONHDL: 134.34
Triglycerides: 170 mg/dL — ABNORMAL HIGH (ref 0.0–149.0)
VLDL: 34 mg/dL (ref 0.0–40.0)

## 2015-04-01 LAB — CBC WITH DIFFERENTIAL/PLATELET
Basophils Absolute: 0 10*3/uL (ref 0.0–0.1)
Basophils Relative: 0.3 % (ref 0.0–3.0)
EOS PCT: 1.1 % (ref 0.0–5.0)
Eosinophils Absolute: 0.1 10*3/uL (ref 0.0–0.7)
HCT: 49.1 % (ref 39.0–52.0)
HEMOGLOBIN: 16.7 g/dL (ref 13.0–17.0)
Lymphocytes Relative: 32.8 % (ref 12.0–46.0)
Lymphs Abs: 2.6 10*3/uL (ref 0.7–4.0)
MCHC: 33.9 g/dL (ref 30.0–36.0)
MCV: 88.6 fl (ref 78.0–100.0)
MONO ABS: 0.7 10*3/uL (ref 0.1–1.0)
Monocytes Relative: 9.3 % (ref 3.0–12.0)
NEUTROS ABS: 4.4 10*3/uL (ref 1.4–7.7)
Neutrophils Relative %: 56.5 % (ref 43.0–77.0)
Platelets: 330 10*3/uL (ref 150.0–400.0)
RBC: 5.54 Mil/uL (ref 4.22–5.81)
RDW: 13 % (ref 11.5–15.5)
WBC: 7.8 10*3/uL (ref 4.0–10.5)

## 2015-04-01 LAB — TSH: TSH: 3.83 u[IU]/mL (ref 0.35–4.50)

## 2015-04-15 ENCOUNTER — Other Ambulatory Visit (INDEPENDENT_AMBULATORY_CARE_PROVIDER_SITE_OTHER): Payer: Medicare Other

## 2015-04-15 ENCOUNTER — Telehealth: Payer: Self-pay

## 2015-04-15 DIAGNOSIS — R945 Abnormal results of liver function studies: Principal | ICD-10-CM

## 2015-04-15 DIAGNOSIS — R7989 Other specified abnormal findings of blood chemistry: Secondary | ICD-10-CM

## 2015-04-15 LAB — HEPATIC FUNCTION PANEL
ALBUMIN: 4.2 g/dL (ref 3.5–5.2)
ALT: 83 U/L — ABNORMAL HIGH (ref 0–53)
AST: 45 U/L — ABNORMAL HIGH (ref 0–37)
Alkaline Phosphatase: 45 U/L (ref 39–117)
Bilirubin, Direct: 0.2 mg/dL (ref 0.0–0.3)
Total Bilirubin: 0.9 mg/dL (ref 0.2–1.2)
Total Protein: 7.4 g/dL (ref 6.0–8.3)

## 2015-04-15 NOTE — Telephone Encounter (Signed)
Pt notified of results. No questions or concerns at this time. Please book for a lab appt in 4-6 wks.

## 2015-04-15 NOTE — Telephone Encounter (Signed)
Patient scheduled labs for 04/29/2015 labs only

## 2015-04-15 NOTE — Telephone Encounter (Signed)
-----   Message from [name], MD sent at 04/15/2015  1:23 PM EDT ----- Liver functions are improving- yay!  We'll repeat these in 4-6 weeks at a lab only visit (Hepatic panel, dx elevated LFTs)

## 2015-04-29 ENCOUNTER — Other Ambulatory Visit: Payer: Medicare Other

## 2015-05-18 ENCOUNTER — Other Ambulatory Visit (INDEPENDENT_AMBULATORY_CARE_PROVIDER_SITE_OTHER): Payer: Medicare Other

## 2015-05-18 ENCOUNTER — Ambulatory Visit (INDEPENDENT_AMBULATORY_CARE_PROVIDER_SITE_OTHER): Payer: Medicare Other

## 2015-05-18 DIAGNOSIS — Z23 Encounter for immunization: Secondary | ICD-10-CM

## 2015-05-18 DIAGNOSIS — R7989 Other specified abnormal findings of blood chemistry: Secondary | ICD-10-CM

## 2015-05-18 DIAGNOSIS — R945 Abnormal results of liver function studies: Principal | ICD-10-CM

## 2015-05-18 LAB — HEPATIC FUNCTION PANEL
ALBUMIN: 4.2 g/dL (ref 3.5–5.2)
ALT: 75 U/L — ABNORMAL HIGH (ref 0–53)
AST: 36 U/L (ref 0–37)
Alkaline Phosphatase: 47 U/L (ref 39–117)
BILIRUBIN TOTAL: 0.8 mg/dL (ref 0.2–1.2)
Bilirubin, Direct: 0.2 mg/dL (ref 0.0–0.3)
Total Protein: 7.4 g/dL (ref 6.0–8.3)

## 2015-07-06 ENCOUNTER — Other Ambulatory Visit: Payer: Self-pay | Admitting: General Practice

## 2015-07-06 MED ORDER — SILDENAFIL CITRATE 20 MG PO TABS: 40.0000 mg | ORAL_TABLET | ORAL | Status: DC | PRN

## 2015-07-18 IMAGING — CR DG ABDOMEN 1V
2 series · 2 of 2 positions shown · non-contrast
Comparison: None.

CLINICAL DATA: Acute urinary retention.

EXAM:
ABDOMEN - 1 VIEW

[AP (1 of 2)]
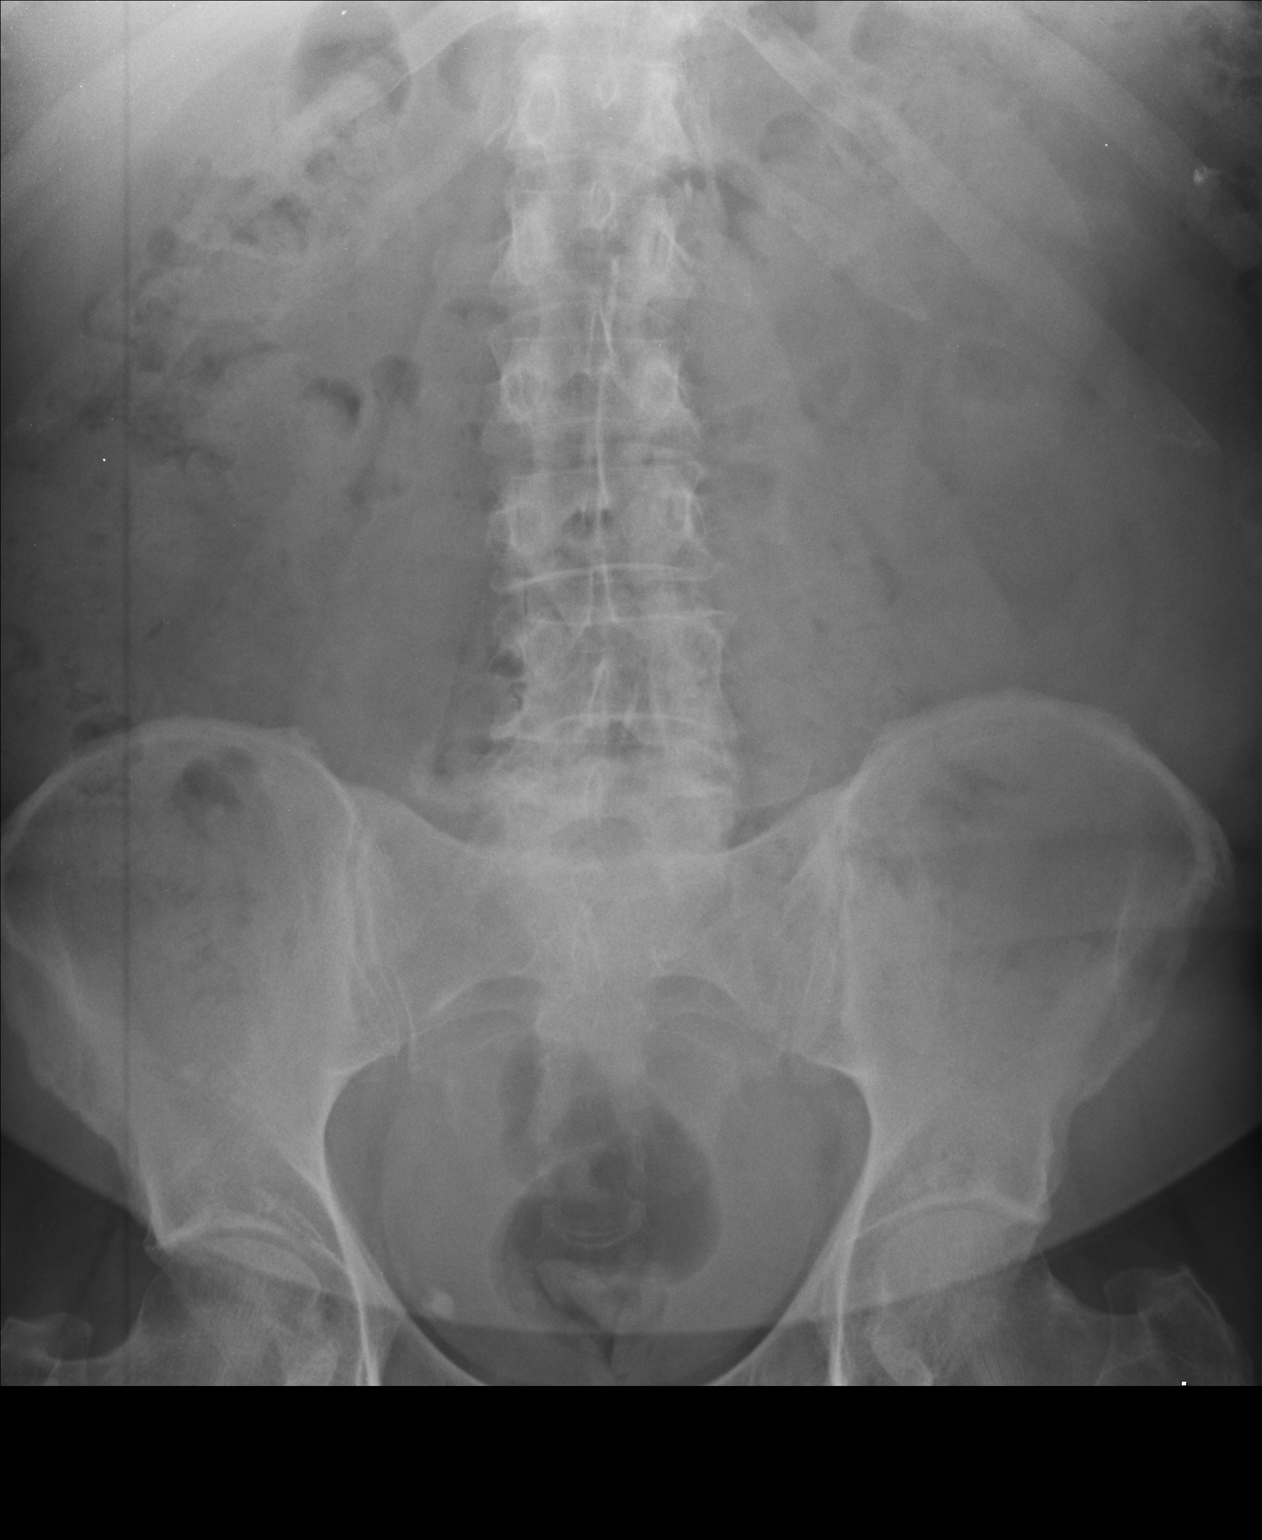

[AP (2 of 2)]
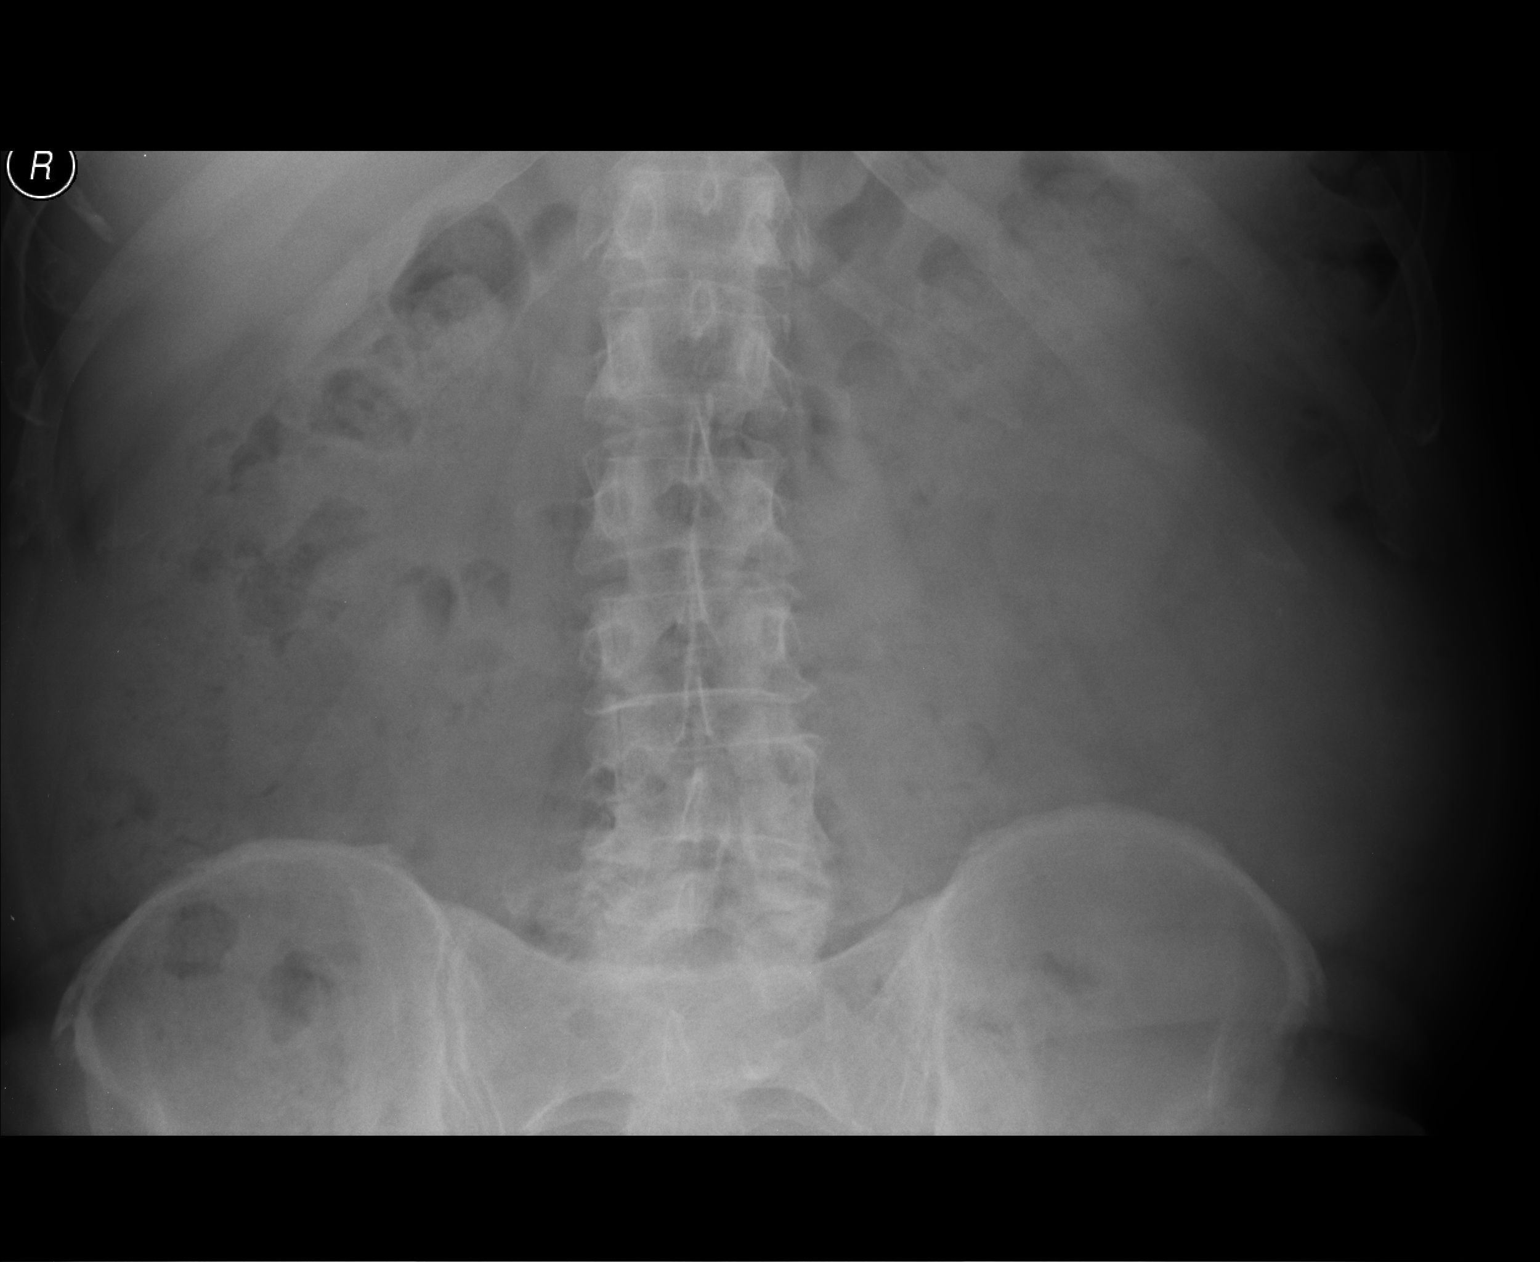

[2 of 2 positions shown; findings below may reference images not displayed]

FINDINGS: Bowel gas pattern is nonobstructive with mild fecal retention
throughout the colon. There is a calcification of the right pelvis
likely a phlebolith. There are degenerative changes of the spine and
hips.
IMPRESSION: Nonobstructive bowel gas pattern.

## 2015-08-17 ENCOUNTER — Other Ambulatory Visit: Payer: Self-pay | Admitting: Family Medicine

## 2015-08-29 ENCOUNTER — Other Ambulatory Visit: Payer: Self-pay | Admitting: Family Medicine

## 2015-10-03 ENCOUNTER — Other Ambulatory Visit: Payer: Self-pay | Admitting: General Practice

## 2015-10-03 ENCOUNTER — Encounter: Payer: Self-pay | Admitting: Family Medicine

## 2015-10-03 ENCOUNTER — Other Ambulatory Visit: Payer: Self-pay

## 2015-10-03 ENCOUNTER — Ambulatory Visit (INDEPENDENT_AMBULATORY_CARE_PROVIDER_SITE_OTHER): Payer: Medicare Other | Admitting: Family Medicine

## 2015-10-03 ENCOUNTER — Telehealth: Payer: Self-pay | Admitting: Family Medicine

## 2015-10-03 VITALS — BP 144/80 | HR 67 | Temp 98.1°F | Ht 70.0 in | Wt 222.6 lb

## 2015-10-03 DIAGNOSIS — E785 Hyperlipidemia, unspecified: Secondary | ICD-10-CM | POA: Diagnosis not present

## 2015-10-03 DIAGNOSIS — E038 Other specified hypothyroidism: Secondary | ICD-10-CM | POA: Diagnosis not present

## 2015-10-03 DIAGNOSIS — I10 Essential (primary) hypertension: Secondary | ICD-10-CM | POA: Diagnosis not present

## 2015-10-03 DIAGNOSIS — R7989 Other specified abnormal findings of blood chemistry: Secondary | ICD-10-CM

## 2015-10-03 LAB — BASIC METABOLIC PANEL
BUN: 13 mg/dL (ref 6–23)
CHLORIDE: 100 meq/L (ref 96–112)
CO2: 31 meq/L (ref 19–32)
CREATININE: 1.12 mg/dL (ref 0.40–1.50)
Calcium: 9.2 mg/dL (ref 8.4–10.5)
GFR: 68.62 mL/min (ref >60.00)
Glucose, Bld: 108 mg/dL — ABNORMAL HIGH (ref 70–99)
Potassium: 3.5 mEq/L (ref 3.5–5.1)
Sodium: 139 mEq/L (ref 135–145)

## 2015-10-03 LAB — CBC WITH DIFFERENTIAL/PLATELET
BASOS ABS: 0 10*3/uL (ref 0.0–0.1)
Basophils Relative: 0.5 % (ref 0.0–3.0)
EOS ABS: 0.2 10*3/uL (ref 0.0–0.7)
Eosinophils Relative: 2 % (ref 0.0–5.0)
HCT: 50.9 % (ref 39.0–52.0)
HEMOGLOBIN: 17.2 g/dL — AB (ref 13.0–17.0)
Lymphocytes Relative: 28.1 % (ref 12.0–46.0)
Lymphs Abs: 2.4 10*3/uL (ref 0.7–4.0)
MCHC: 33.7 g/dL (ref 30.0–36.0)
MCV: 88.4 fl (ref 78.0–100.0)
Monocytes Absolute: 0.8 10*3/uL (ref 0.1–1.0)
Monocytes Relative: 9.8 % (ref 3.0–12.0)
Neutro Abs: 5.1 10*3/uL (ref 1.4–7.7)
Neutrophils Relative %: 59.6 % (ref 43.0–77.0)
Platelets: 312 10*3/uL (ref 150.0–400.0)
RBC: 5.76 Mil/uL (ref 4.22–5.81)
RDW: 13.2 % (ref 11.5–15.5)
WBC: 8.6 10*3/uL (ref 4.0–10.5)

## 2015-10-03 LAB — HEPATIC FUNCTION PANEL
ALT: 57 U/L — AB (ref 0–53)
AST: 35 U/L (ref 0–37)
Albumin: 4.2 g/dL (ref 3.5–5.2)
Alkaline Phosphatase: 43 U/L (ref 39–117)
Bilirubin, Direct: 0.1 mg/dL (ref 0.0–0.3)
TOTAL PROTEIN: 7.1 g/dL (ref 6.0–8.3)
Total Bilirubin: 0.6 mg/dL (ref 0.2–1.2)

## 2015-10-03 LAB — LIPID PANEL
CHOL/HDL RATIO: 5
CHOLESTEROL: 174 mg/dL (ref 0–200)
HDL: 35.2 mg/dL — ABNORMAL LOW (ref >39.00)
LDL CALC: 103 mg/dL — AB (ref 0–99)
NonHDL: 138.68
Triglycerides: 180 mg/dL — ABNORMAL HIGH (ref 0.0–149.0)
VLDL: 36 mg/dL (ref 0.0–40.0)

## 2015-10-03 LAB — TSH: TSH: 4.73 u[IU]/mL — AB (ref 0.35–4.50)

## 2015-10-03 MED ORDER — GABAPENTIN 300 MG PO CAPS: 300.0000 mg | ORAL_CAPSULE | Freq: Every day | ORAL | Status: DC

## 2015-10-03 MED ORDER — LEVOTHYROXINE SODIUM 50 MCG PO TABS: 50.0000 ug | ORAL_TABLET | Freq: Every day | ORAL | Status: DC

## 2015-10-03 MED ORDER — SILDENAFIL CITRATE 20 MG PO TABS: 40.0000 mg | ORAL_TABLET | ORAL | Status: DC | PRN

## 2015-10-03 NOTE — Assessment & Plan Note (Signed)
Chronic problem.  Pt is currently asymptomatic.  Check labs.  Adjust meds prn  

## 2015-10-03 NOTE — Progress Notes (Signed)
   Subjective:    Patient ID: [name], male    DOB: 05/08/1946, 70 y.o.   MRN: [id number]  HPI HTN- chronic problem, on Amlodipine, Valsartan-HCTZ.  Pt took BP meds at 7:30 this AM.  No recent elevated readings.  No CP, SOB, HAs, visual changes, edema.  Hyperlipidemia- chronic problem, on Vytorin Mon, Wed, Fri.  No abd pain, N/V, myalgias.  Hypothyroid- chronic problem, on Levothyroxine.  Denies excessive fatigue, changes to skin/hair/nail, constipation.   Review of Systems For ROS see HPI     Objective:   Physical Exam  Constitutional: He is oriented to person, place, and time. He appears well-developed and well-nourished. No distress.  HENT:  Head: Normocephalic and atraumatic.  Eyes: Conjunctivae and EOM are normal. Pupils are equal, round, and reactive to light.  Neck: Normal range of motion. Neck supple. No thyromegaly present.  Cardiovascular: Normal rate, regular rhythm, normal heart sounds and intact distal pulses.   No murmur heard. Pulmonary/Chest: Effort normal and breath sounds normal. No respiratory distress.  Abdominal: Soft. Bowel sounds are normal. He exhibits no distension.  Musculoskeletal: He exhibits no edema.  Lymphadenopathy:    He has no cervical adenopathy.  Neurological: He is alert and oriented to person, place, and time. No cranial nerve deficit.  Skin: Skin is warm and dry.  Psychiatric: He has a normal mood and affect. His behavior is normal.  Vitals reviewed.         Assessment & Plan:

## 2015-10-03 NOTE — Assessment & Plan Note (Signed)
Chronic problem.  Tolerating Vytorin w/o difficulty.  Stressed need for healthy diet and regular exercise.  Check labs.  Adjust meds prn  

## 2015-10-03 NOTE — Progress Notes (Signed)
Pre visit review using our clinic review tool, if applicable. No additional management support is needed unless otherwise documented below in the visit note. 

## 2015-10-03 NOTE — Patient Instructions (Signed)
Schedule your complete physical in 6 months We'll notify you of your lab results and make any changes if needed Continue to work on healthy diet and regular exercise- you can do it! Check your BP at home 1-2x/week and if regularly higher than 140/90, let me know! Call with any questions or concerns If you want to join [place] at the new [place] office, any scheduled appointments will automatically transfer and we will see you at [address] [place] [address], [place], [place] [address] [facility]) Have a wonderful week!!!

## 2015-10-03 NOTE — Assessment & Plan Note (Signed)
Chronic problem.  BP is elevated today but pt reports that his BP typically runs high while at MD office.  Has cuff at home.  He is willing to check BP twice weekly and report if readings are elevated.  Check labs.  No med changes at this time.  Will follow closely.

## 2015-10-04 ENCOUNTER — Encounter: Payer: Self-pay | Admitting: General Practice

## 2015-10-04 ENCOUNTER — Telehealth: Payer: Self-pay | Admitting: Family Medicine

## 2015-10-04 DIAGNOSIS — N529 Male erectile dysfunction, unspecified: Secondary | ICD-10-CM | POA: Insufficient documentation

## 2015-10-04 NOTE — Telephone Encounter (Signed)
Caller name: Nicki Reaper  Relationship to patient: Pharmacy ([facility] )  Can be reached: [phone] Opt 2 AND THEN opt 1 to get you to a pharmacist.   Reason for call: He says that they need a Dx code for pt's medication REVATIO. Requesting a call back .

## 2015-10-04 NOTE — Telephone Encounter (Signed)
error:[id number] ° °

## 2015-10-04 NOTE — Telephone Encounter (Signed)
Called back and gave pharmacists diagnosis code in regards to pt sildenafil. PA was completed and faxed to [facility] today as well.

## 2015-10-06 ENCOUNTER — Telehealth: Payer: Self-pay | Admitting: Family Medicine

## 2015-10-06 MED ORDER — HYDRALAZINE HCL 10 MG PO TABS: 10.0000 mg | ORAL_TABLET | Freq: Two times a day (BID) | ORAL | Status: DC

## 2015-10-06 NOTE — Telephone Encounter (Signed)
We need to add a BP medication since his BPs are high on Amlodipine, Valsartan, HCTZ (please verify he is taking all 3!)  We need to add Hydralazine 10mg  BID and continue to monitor BP

## 2015-10-06 NOTE — Telephone Encounter (Signed)
Spoke with pt, he takes amlodipine daily and the valsartan-hctz combination pill daily. I filled the hydralazine for him today and advised to monitor his BP this weekend and call on Monday to let me know the readings.

## 2015-10-06 NOTE — Telephone Encounter (Signed)
Caller name: [facility]  Relationship to patient: Self   Can be reached: [phone]    Reason for call: pt would like to update PCP on his BP .   Wednesday 8:25a   158/80  11:20a. 168/81  -- Today  7:00a   172/87    1:30p161/84  Pt says that his BP is still running high

## 2015-10-10 NOTE — Telephone Encounter (Signed)
Received Denial on PA for Sildenafil 20mg  [generic revatio], coverage is provided for pulmonary arterial hypertension/SLS 02/27 Please Advise if you would like patient to have [employer] information on purchasing OOP.

## 2015-10-11 NOTE — Telephone Encounter (Signed)
Yes- please give info on [facility] in case he wants to buy OOP

## 2015-10-12 ENCOUNTER — Encounter: Payer: Self-pay | Admitting: Family Medicine

## 2015-10-17 DIAGNOSIS — N401 Enlarged prostate with lower urinary tract symptoms: Secondary | ICD-10-CM | POA: Diagnosis not present

## 2015-10-17 DIAGNOSIS — N138 Other obstructive and reflux uropathy: Secondary | ICD-10-CM | POA: Diagnosis not present

## 2015-10-17 DIAGNOSIS — Z0000 Encounter for general adult medical examination without abnormal findings: Secondary | ICD-10-CM | POA: Diagnosis not present

## 2015-10-31 ENCOUNTER — Other Ambulatory Visit (INDEPENDENT_AMBULATORY_CARE_PROVIDER_SITE_OTHER): Payer: Medicare Other

## 2015-10-31 DIAGNOSIS — R946 Abnormal results of thyroid function studies: Secondary | ICD-10-CM

## 2015-10-31 DIAGNOSIS — R7989 Other specified abnormal findings of blood chemistry: Secondary | ICD-10-CM

## 2015-10-31 LAB — TSH: TSH: 4.57 u[IU]/mL — ABNORMAL HIGH (ref 0.35–4.50)

## 2015-11-03 ENCOUNTER — Other Ambulatory Visit: Payer: Self-pay | Admitting: Family Medicine

## 2015-11-03 DIAGNOSIS — E038 Other specified hypothyroidism: Secondary | ICD-10-CM

## 2015-11-05 ENCOUNTER — Emergency Department (HOSPITAL_COMMUNITY)
Admission: EM | Admit: 2015-11-05 | Discharge: 2015-11-05 | Disposition: A | Discharge status: Home / Self Care | Payer: Medicare Other | Attending: Emergency Medicine | Admitting: Emergency Medicine

## 2015-11-05 ENCOUNTER — Encounter (HOSPITAL_COMMUNITY): Payer: Self-pay | Admitting: Emergency Medicine

## 2015-11-05 ENCOUNTER — Telehealth: Payer: Self-pay | Admitting: Internal Medicine

## 2015-11-05 DIAGNOSIS — M109 Gout, unspecified: Secondary | ICD-10-CM | POA: Diagnosis not present

## 2015-11-05 DIAGNOSIS — K219 Gastro-esophageal reflux disease without esophagitis: Secondary | ICD-10-CM | POA: Insufficient documentation

## 2015-11-05 DIAGNOSIS — Z87891 Personal history of nicotine dependence: Secondary | ICD-10-CM | POA: Diagnosis not present

## 2015-11-05 DIAGNOSIS — E039 Hypothyroidism, unspecified: Secondary | ICD-10-CM | POA: Diagnosis not present

## 2015-11-05 DIAGNOSIS — Z880 Allergy status to penicillin: Secondary | ICD-10-CM | POA: Insufficient documentation

## 2015-11-05 DIAGNOSIS — I4891 Unspecified atrial fibrillation: Secondary | ICD-10-CM

## 2015-11-05 DIAGNOSIS — E785 Hyperlipidemia, unspecified: Secondary | ICD-10-CM | POA: Insufficient documentation

## 2015-11-05 DIAGNOSIS — I10 Essential (primary) hypertension: Secondary | ICD-10-CM | POA: Diagnosis not present

## 2015-11-05 DIAGNOSIS — Z87438 Personal history of other diseases of male genital organs: Secondary | ICD-10-CM | POA: Insufficient documentation

## 2015-11-05 DIAGNOSIS — Z79899 Other long term (current) drug therapy: Secondary | ICD-10-CM | POA: Insufficient documentation

## 2015-11-05 DIAGNOSIS — R000 Tachycardia, unspecified: Secondary | ICD-10-CM | POA: Diagnosis present

## 2015-11-05 LAB — BASIC METABOLIC PANEL
Anion gap: 12 (ref 5–15)
BUN: 13 mg/dL (ref 6–20)
CO2: 26 mmol/L (ref 22–32)
Calcium: 9 mg/dL (ref 8.9–10.3)
Chloride: 102 mmol/L (ref 101–111)
Creatinine, Ser: 1.1 mg/dL (ref 0.61–1.24)
GFR calc Af Amer: >60 mL/min (ref >60)
GFR calc non Af Amer: >60 mL/min (ref >60)
Glucose, Bld: 118 mg/dL — ABNORMAL HIGH (ref 65–99)
Potassium: 3.3 mmol/L — ABNORMAL LOW (ref 3.5–5.1)
Sodium: 140 mmol/L (ref 135–145)

## 2015-11-05 LAB — CBC WITH DIFFERENTIAL/PLATELET
Basophils Absolute: 0 10*3/uL (ref 0.0–0.1)
Basophils Relative: 0 %
Eosinophils Absolute: 0.2 10*3/uL (ref 0.0–0.7)
Eosinophils Relative: 2 %
HCT: 50.2 % (ref 39.0–52.0)
Hemoglobin: 17.7 g/dL — ABNORMAL HIGH (ref 13.0–17.0)
Lymphocytes Relative: 47 %
Lymphs Abs: 3.3 10*3/uL (ref 0.7–4.0)
MCH: 30.3 pg (ref 26.0–34.0)
MCHC: 35.3 g/dL (ref 30.0–36.0)
MCV: 85.8 fL (ref 78.0–100.0)
Monocytes Absolute: 1 10*3/uL (ref 0.1–1.0)
Monocytes Relative: 15 %
Neutro Abs: 2.5 10*3/uL (ref 1.7–7.7)
Neutrophils Relative %: 36 %
Platelets: 319 10*3/uL (ref 150–400)
RBC: 5.85 MIL/uL — ABNORMAL HIGH (ref 4.22–5.81)
RDW: 12.9 % (ref 11.5–15.5)
WBC: 7 10*3/uL (ref 4.0–10.5)

## 2015-11-05 LAB — TROPONIN I: Troponin I: <0.03 ng/mL (ref <0.031)

## 2015-11-05 LAB — TSH: TSH: 5.593 u[IU]/mL — ABNORMAL HIGH (ref 0.350–4.500)

## 2015-11-05 LAB — MAGNESIUM: Magnesium: 2.3 mg/dL (ref 1.7–2.4)

## 2015-11-05 MED ORDER — POTASSIUM CHLORIDE 10 MEQ/100ML IV SOLN
10.0000 meq | Freq: Once | INTRAVENOUS | Status: DC
  Filled 2015-11-05: qty 100

## 2015-11-05 MED ORDER — APIXABAN 5 MG PO TABS: 5.0000 mg | ORAL_TABLET | Freq: Two times a day (BID) | ORAL | Status: DC

## 2015-11-05 MED ORDER — PROPOFOL 10 MG/ML IV BOLUS
0.5000 mg/kg | Freq: Once | INTRAVENOUS | Status: DC
  Filled 2015-11-05: qty 20

## 2015-11-05 MED ORDER — SODIUM CHLORIDE 0.9 % IV SOLN
INTRAVENOUS | Status: AC | PRN
  Administered 2015-11-05: 500 mL/h via INTRAVENOUS

## 2015-11-05 MED ORDER — PROPOFOL 10 MG/ML IV BOLUS
INTRAVENOUS | Status: AC | PRN
  Administered 2015-11-05: 20 mg via INTRAVENOUS
  Administered 2015-11-05: 40 mg via INTRAVENOUS

## 2015-11-05 MED ORDER — DILTIAZEM LOAD VIA INFUSION
20.0000 mg | Freq: Once | INTRAVENOUS | Status: AC
  Administered 2015-11-05: 20 mg via INTRAVENOUS
  Filled 2015-11-05: qty 20

## 2015-11-05 MED ORDER — POTASSIUM CHLORIDE CRYS ER 20 MEQ PO TBCR
40.0000 meq | EXTENDED_RELEASE_TABLET | Freq: Once | ORAL | Status: DC
  Filled 2015-11-05: qty 2

## 2015-11-05 MED ORDER — POTASSIUM CHLORIDE CRYS ER 20 MEQ PO TBCR
20.0000 meq | EXTENDED_RELEASE_TABLET | Freq: Once | ORAL | Status: AC
  Administered 2015-11-05: 20 meq via ORAL

## 2015-11-05 MED ORDER — HYDROMORPHONE HCL 1 MG/ML IJ SOLN
0.5000 mg | Freq: Once | INTRAMUSCULAR | Status: AC
  Administered 2015-11-05: 0.5 mg via INTRAVENOUS
  Filled 2015-11-05: qty 1

## 2015-11-05 MED ORDER — DILTIAZEM HCL 100 MG IV SOLR
5.0000 mg/h | INTRAVENOUS | Status: DC
  Administered 2015-11-05: 5 mg/h via INTRAVENOUS
  Filled 2015-11-05 (×2): qty 100

## 2015-11-05 MED ORDER — MAGNESIUM SULFATE 2 GM/50ML IV SOLN
2.0000 g | Freq: Once | INTRAVENOUS | Status: AC
  Administered 2015-11-05: 2 g via INTRAVENOUS
  Filled 2015-11-05: qty 50

## 2015-11-05 NOTE — Telephone Encounter (Signed)
On Call Cardiology Telephone Note  I was contacted by Dr. Wilson Singer in the [facility] ED regarding medication management of new-onset atrial fibrillation.  Stephen Morrison reportedly presented to the ED with palpitations that began earlier in the evening was found to be in atrial fibrillation with rapid ventricular response.  He underwent successful direct current cardioversion is now in sinus rhythm.  Dr. Wilson Singer is planning to discharge the patient with outpatient cardiology follow-up, and has inquired about general recommendations for medical therapy.  Based on the patient's age and history of hypertension (CHADSVASC = 2), it is reasonable to initiate systemic anticoagulation unless a contraindication such as bleeding exists.  I would favor use of a NOAC, given plan to discharge the patient home today.  Low-dose beta blocker, as resting heart rate tolerates, should also be considered.  Nelva Bush, MD Cardiology Fellow

## 2015-11-05 NOTE — Discharge Instructions (Signed)
Atrial Fibrillation °Atrial fibrillation is a type of irregular or rapid heartbeat (arrhythmia). In atrial fibrillation, the heart quivers continuously in a chaotic pattern. This occurs when parts of the heart receive disorganized signals that make the heart unable to pump blood normally. This can increase the risk for stroke, heart failure, and other heart-related conditions. There are different types of atrial fibrillation, including: °· Paroxysmal atrial fibrillation. This type starts suddenly, and it usually stops on its own shortly after it starts. °· Persistent atrial fibrillation. This type often lasts longer than a week. It may stop on its own or with treatment. °· Long-lasting persistent atrial fibrillation. This type lasts longer than 12 months. °· Permanent atrial fibrillation. This type does not go away. °Talk with your health care provider to learn about the type of atrial fibrillation that you have. °CAUSES °This condition is caused by some heart-related conditions or procedures, including: °· A heart attack. °· Coronary artery disease. °· Heart failure. °· Heart valve conditions. °· High blood pressure. °· Inflammation of the sac that surrounds the heart (pericarditis). °· Heart surgery. °· Certain heart rhythm disorders, such as Wolf-Parkinson-White syndrome. °Other causes include: °· Pneumonia. °· Obstructive sleep apnea. °· Blockage of an artery in the lungs (pulmonary embolism, or PE). °· Lung cancer. °· Chronic lung disease. °· Thyroid problems, especially if the thyroid is overactive (hyperthyroidism). °· Caffeine. °· Excessive alcohol use or illegal drug use. °· Use of some medicines, including certain decongestants and diet pills. °Sometimes, the cause cannot be found. °RISK FACTORS °This condition is more likely to develop in: °· People who are older in age. °· People who smoke. °· People who have diabetes mellitus. °· People who are overweight (obese). °· Athletes who exercise  vigorously. °SYMPTOMS °Symptoms of this condition include: °· A feeling that your heart is beating rapidly or irregularly. °· A feeling of discomfort or pain in your chest. °· Shortness of breath. °· Sudden light-headedness or weakness. °· Getting tired easily during exercise. °In some cases, there are no symptoms. °DIAGNOSIS °Your health care provider may be able to detect atrial fibrillation when taking your pulse. If detected, this condition may be diagnosed with: °· An electrocardiogram (ECG). °· A Holter monitor test that records your heartbeat patterns over a 24-hour period. °· Transthoracic echocardiogram (TTE) to evaluate how blood flows through your heart. °· Transesophageal echocardiogram (TEE) to view more detailed images of your heart. °· A stress test. °· Imaging tests, such as a CT scan or chest X-ray. °· Blood tests. °TREATMENT °The main goals of treatment are to prevent blood clots from forming and to keep your heart beating at a normal rate and rhythm. The type of treatment that you receive depends on many factors, such as your underlying medical conditions and how you feel when you are experiencing atrial fibrillation. °This condition may be treated with: °· Medicine to slow down the heart rate, bring the heart's rhythm back to normal, or prevent clots from forming. °· Electrical cardioversion. This is a procedure that resets your heart's rhythm by delivering a controlled, low-energy shock to the heart through your skin. °· Different types of ablation, such as catheter ablation, catheter ablation with pacemaker, or surgical ablation. These procedures destroy the heart tissues that send abnormal signals. When the pacemaker is used, it is placed under your skin to help your heart beat in a regular rhythm. °HOME CARE INSTRUCTIONS °· Take over-the counter and prescription medicines only as told by your health care provider. °·   If your health care provider prescribed a blood-thinning medicine  (anticoagulant), take it exactly as told. Taking too much blood-thinning medicine can cause bleeding. If you do not take enough blood-thinning medicine, you will not have the protection that you need against stroke and other problems.  Do not use tobacco products, including cigarettes, chewing tobacco, and e-cigarettes. If you need help quitting, ask your health care provider.  If you have obstructive sleep apnea, manage your condition as told by your health care provider.  Do not drink alcohol.  Do not drink beverages that contain caffeine, such as coffee, soda, and tea.  Maintain a healthy weight. Do not use diet pills unless your health care provider approves. Diet pills may make heart problems worse.  Follow diet instructions as told by your health care provider.  Exercise regularly as told by your health care provider.  Keep all follow-up visits as told by your health care provider. This is important. PREVENTION  Avoid drinking beverages that contain caffeine or alcohol.  Avoid certain medicines, especially medicines that are used for breathing problems.  Avoid certain herbs and herbal medicines, such as those that contain ephedra or ginseng.  Do not use illegal drugs, such as cocaine and amphetamines.  Do not smoke.  Manage your high blood pressure. SEEK MEDICAL CARE IF:  You notice a change in the rate, rhythm, or strength of your heartbeat.  You are taking an anticoagulant and you notice increased bruising.  You tire more easily when you exercise or exert yourself. SEEK IMMEDIATE MEDICAL CARE IF:  You have chest pain, abdominal pain, sweating, or weakness.  You feel nauseous.  You notice blood in your vomit, bowel movement, or urine.  You have shortness of breath.  You suddenly have swollen feet and ankles.  You feel dizzy.  You have sudden weakness or numbness of the face, arm, or leg, especially on one side of the body.  You have trouble speaking,  trouble understanding, or both (aphasia).  Your face or your eyelid droops on one side. These symptoms may represent a serious problem that is an emergency. Do not wait to see if the symptoms will go away. Get medical help right away. Call your local emergency services (911 in the [place] Do not drive yourself to the hospital.   This information is not intended to replace advice given to you by your health care provider. Make sure you discuss any questions you have with your health care provider.   Document Released: 07/30/2005 Document Revised: 04/20/2015 Document Reviewed: 11/24/2014 Elsevier Interactive Patient Education 2016 [employer].  Hospital doctor cardioversion is the delivery of a jolt of electricity to change the rhythm of the heart. Sticky patches or metal paddles are placed on the chest to deliver the electricity from a device. This is done to restore a normal rhythm. A rhythm that is too fast or not regular keeps the heart from pumping well. Electrical cardioversion is done in an emergency if:   There is low or no blood pressure as a result of the heart rhythm.   Normal rhythm must be restored as fast as possible to protect the brain and heart from further damage.   It may save a life. Cardioversion may be done for heart rhythms that are not immediately life threatening, such as atrial fibrillation or flutter, in which:   The heart is beating too fast or is not regular.   Medicine to change the rhythm has not worked.   It is  safe to wait in order to allow time for preparation.  Symptoms of the abnormal rhythm are bothersome.  The risk of stroke and other serious problems can be reduced. LET [facility] CARE PROVIDER KNOW ABOUT:   Any allergies you have.  All medicines you are taking, including vitamins, herbs, eye drops, creams, and over-the-counter medicines.  Previous problems you or members of your family have had with the use of  anesthetics.   Any blood disorders you have.   Previous surgeries you have had.   Medical conditions you have. RISKS AND COMPLICATIONS  Generally, this is a safe procedure. However, problems can occur and include:   Breathing problems related to the anesthetic used.  A blood clot that breaks free and travels to other parts of your body. This could cause a stroke or other problems. The risk of this is lowered by use of blood-thinning medicine (anticoagulant) prior to the procedure.  Cardiac arrest (rare). BEFORE THE PROCEDURE   You may have tests to detect blood clots in your heart and to evaluate heart function.  You may start taking anticoagulants so your blood does not clot as easily.   Medicines may be given to help stabilize your heart rate and rhythm. PROCEDURE  You will be given medicine through an IV tube to reduce discomfort and make you sleepy (sedative).   An electrical shock will be delivered. AFTER THE PROCEDURE Your heart rhythm will be watched to make sure it does not change.    This information is not intended to replace advice given to you by your health care provider. Make sure you discuss any questions you have with your health care provider.   Document Released: 07/20/2002 Document Revised: 08/20/2014 Document Reviewed: 02/11/2013 Elsevier Interactive Patient Education [employer].

## 2015-11-05 NOTE — ED Notes (Addendum)
Pt states when he went to bed last night he felt normal, he woke up around 0200 feeling like his hear was "beating funny" and racing. Pt denies pain anywhere. Pt is alert and oriented x 4. Pt has a history of hypertension. Pt denies chest pain, lightheadedness, dizziness. Pt has no history of irregular hr. EDP at pt bedside

## 2015-11-05 NOTE — ED Provider Notes (Signed)
CSN: [id number]     Arrival date & time 11/05/15  0245 History  By signing my name below, I, [name], attest that this documentation has been prepared under the direction and in the presence of [name], MD. Electronically Signed: [name], ED Scribe. 11/05/2015. 3:12 AM.   Chief Complaint  Patient presents with  . Tachycardia  . Irregular Heart Beat   The history is provided by the patient. No language interpreter was used.   HPI Comments: Stephen Morrison is a 70 y.o. male with an hx of HTN, HLN, hypothyroidism who presents to the Emergency Department complaining of constant, moderate heart palpations onset 1 hour ago. Patient reports that he was asymptomatic until he woke from sleep and felt uncomfortable. He is not sure why, but decided to check his and it was fast/erratic. Per patient, he had an normal appointment yesterday for his hypothyroidism and states that his levels are improving with medication. He notes that he drank 3 glasses wine earlier tonight but denies frequent EtOH consumption. Denies nausea, SOB, dizziness, light-headedness, chest pain, fever, and chills.  Past Medical History  Diagnosis Date  . Hyperlipidemia   . Hypertension   . PSA elevation   . Gout     STABLE PER PT ON 05-12-2014  . BPH (benign prostatic hyperplasia)   . Urinary retention   . Foley catheter in place   . Diverticulosis of colon   . Hypothyroidism   . GERD (gastroesophageal reflux disease)    Past Surgical History  Procedure Laterality Date  . Colonoscopy  01-31-2007  . Orif right wrist fx  2009  . Excision benign cyst posterior neck  2010  . Transurethral resection of prostate N/A 05/17/2014    Procedure: TRANSURETHRAL RESECTION OF THE PROSTATE WITH GYRUS INSTRUMENTS;  Surgeon: Bernestine Amass, MD;  Location: [facility];  Service: Urology;  Laterality: N/A;  . Insertion of suprapubic catheter N/A 05/17/2014    Procedure: INSERTION OF SUPRAPUBIC CATHETER;   Surgeon: Bernestine Amass, MD;  Location: [facility];  Service: Urology;  Laterality: N/A;   Family History  Problem Relation Age of Onset  . Alcohol abuse Father   . Dementia Father   . Hyperlipidemia Mother   . Hypertension Mother    Social History  Substance Use Topics  . Smoking status: Former Smoker -- 1.00 packs/day for 10 years    Types: Cigarettes    Quit date: 08/13/1973  . Smokeless tobacco: Former Systems developer    Types: Snuff, Chew    Quit date: 05/12/1974  . Alcohol Use: Yes     Comment: OCCASIONALLY    Review of Systems  Constitutional: Negative for fever and chills.  Respiratory: Negative for shortness of breath.   Cardiovascular: Positive for palpitations. Negative for chest pain.  Gastrointestinal: Negative for nausea.  Neurological: Negative for dizziness and light-headedness.  All other systems reviewed and are negative.     Allergies  Amoxicillin  Home Medications   Prior to Admission medications   Medication Sig Start Date End Date Taking? Authorizing Provider  acetaminophen (TYLENOL) 500 MG tablet Take 1,000 mg by mouth every 6 (six) hours as needed for fever.    Historical Provider, MD  amLODipine (NORVASC) 5 MG tablet TAKE 1 TABLET DAILY 08/29/15   [name], MD  cetirizine (ZYRTEC) 10 MG tablet Take 10 mg by mouth as needed for allergies.     Historical Provider, MD  colchicine 0.6 MG tablet Take 0.6 mg  by mouth 2 (two) times daily as needed (pain). Reported on 10/03/2015    Historical Provider, MD  gabapentin (NEURONTIN) 300 MG capsule Take 1 capsule (300 mg total) by mouth at bedtime. 10/03/15   [name], MD  hydrALAZINE (APRESOLINE) 10 MG tablet Take 1 tablet (10 mg total) by mouth 2 (two) times daily. 10/06/15   [name], MD  ibuprofen (ADVIL,MOTRIN) 200 MG tablet Take 400 mg by mouth every 6 (six) hours as needed for fever.    Historical Provider, MD  lansoprazole (PREVACID) 15 MG capsule Take 15 mg by mouth  every morning.     Historical Provider, MD  levothyroxine (SYNTHROID, LEVOTHROID) 50 MCG tablet Take 1 tablet (50 mcg total) by mouth daily. 10/03/15   [name], MD  sildenafil (REVATIO) 20 MG tablet Take 2 tablets (40 mg total) by mouth as needed. 10/03/15   [name], MD  traMADol (ULTRAM) 50 MG tablet Take 1 tablet (50 mg total) by mouth every 6 (six) hours as needed. Patient not taking: Reported on 10/03/2015 04/19/14   [name], MD  valsartan-hydrochlorothiazide (DIOVAN-HCT) 320-12.5 MG tablet TAKE 1 TABLET DAILY 08/29/15   [name], MD  VYTORIN 10-40 MG tablet TAKE 1 TABLET MONDAY, WEDNESDAY, AND FRIDAY 08/17/15   [name], MD   BP 167/106 mmHg  Pulse 170  Temp(Src) 98.2 F (36.8 C) (Oral)  Resp 22  SpO2 95% Physical Exam  Constitutional: He is oriented to person, place, and time. He appears well-developed and well-nourished. No distress.  Appears well and nondistressed.  HENT:  Head: Normocephalic and atraumatic.  Eyes: Conjunctivae and EOM are normal.  Neck: Neck supple. No tracheal deviation present.  Cardiovascular: A regularly irregular rhythm present. Tachycardia present.   Pulmonary/Chest: Effort normal. No respiratory distress.  Musculoskeletal: Normal range of motion.  Neurological: He is alert and oriented to person, place, and time.  Skin: Skin is warm and dry.  Psychiatric: He has a normal mood and affect. His behavior is normal.  Nursing note and vitals reviewed.   ED Course  .Sedation Date/Time: 11/05/2015 4:15 AM Performed by: [name] Authorized by: [name]  Consent:    Consent obtained:  Verbal and written   Consent given by:  Patient   Risks discussed:  Respiratory compromise necessitating ventilatory assistance and intubation, allergic reaction, inadequate sedation, prolonged sedation necessitating reversal, prolonged hypoxia resulting in organ damage and dysrhythmia   Alternatives discussed:  Analgesia  without sedation and anxiolysis Indications:    Sedation purpose:  Cardioversion   Procedure necessitating sedation performed by:  Physician performing sedation   Intended level of sedation:  Moderate (conscious sedation) Pre-sedation assessment:    Time since last food or drink:  >6h   ASA classification: class 2 - patient with mild systemic disease     Neck mobility: normal     Mouth opening:  2 finger widths   Thyromental distance:  3 finger widths   Pre-sedation assessments completed and reviewed: airway patency, cardiovascular function, hydration status, mental status, nausea/vomiting, pain level, respiratory function and temperature     Pre-sedation assessment completed:  11/05/2015 4:00 AM Immediate pre-procedure details:    Reassessment: Patient reassessed immediately prior to procedure     Reviewed: vital signs, relevant labs/tests and NPO status     Verified: bag valve mask available, emergency equipment available, intubation equipment available, IV patency confirmed, oxygen available and suction available   Procedure details (see MAR for exact dosages):  Preoxygenation:  Nasal cannula   Sedation:  Propofol   Intra-procedure monitoring:  Blood pressure monitoring, cardiac monitor, continuous capnometry, continuous pulse oximetry, frequent LOC assessments and frequent vital sign checks   Intra-procedure events: none     Total sedation time (minutes):  30 Post-procedure details:    Attendance: Constant attendance by certified staff until patient recovered     Recovery: Patient returned to pre-procedure baseline     Post-sedation assessments completed and reviewed: airway patency, cardiovascular function, mental status, pain level, respiratory function and temperature     Patient is stable for discharge or admission: Yes     Patient tolerance:  Tolerated well, no immediate complications  .Cardioversion Date/Time: 11/05/2015 4:20 AM Performed by: [name] Authorized by:  [name] Consent: Verbal consent obtained. Written consent obtained. Risks and benefits: risks, benefits and alternatives were discussed Consent given by: patient Required items: required blood products, implants, devices, and special equipment available Patient identity confirmed: verbally with patient and provided demographic data Time out: Immediately prior to procedure a "time out" was called to verify the correct patient, procedure, equipment, support staff and site/side marked as required. Patient sedated: yes Sedation type: moderate (conscious) sedation Sedatives: propofol Analgesia: hydromorphone Cardioversion basis: elective Pre-procedure rhythm: atrial fibrillation Patient position: patient was placed in a supine position Chest area: chest area exposed Electrodes: pads Electrodes placed: anterior-posterior Number of attempts: 1 Attempt 1 mode: synchronous Attempt 1 waveform: biphasic Attempt 1 shock (in Joules): 150 Attempt 1 outcome: conversion to normal sinus rhythm Post-procedure rhythm: normal sinus rhythm Complications: no complications Patient tolerance: Patient tolerated the procedure well with no immediate complications     DIAGNOSTIC STUDIES: Oxygen Saturation is 95% on RA, adequate by my interpretation.    COORDINATION OF CARE: 2:57 AM Will order medications and labs. Discussed possibly performing electrical cardioversion to return heart to sinus rhythm and pt agreed.  Discussed treatment plan with pt at bedside and pt agreed to plan.   Labs Review Labs Reviewed  TSH - Abnormal; Notable for the following:    TSH 5.593 (*)    All other components within normal limits  BASIC METABOLIC PANEL - Abnormal; Notable for the following:    Potassium 3.3 (*)    Glucose, Bld 118 (*)    All other components within normal limits  CBC WITH DIFFERENTIAL/PLATELET - Abnormal; Notable for the following:    RBC 5.85 (*)    Hemoglobin 17.7 (*)    All other  components within normal limits  MAGNESIUM  TROPONIN I    Imaging Review No results found. I have personally reviewed and evaluated these images and lab results as part of my medical decision-making.   EKG Interpretation   Date/Time:  Saturday November 05 2015 03:45:28 EDT Ventricular Rate:  102 PR Interval:    QRS Duration: 95 QT Interval:  340 QTC Calculation: 443 R Axis:   -13 Text Interpretation:  Atrial fibrillation Low voltage, precordial leads  Probable anteroseptal infarct, old ED PHYSICIAN INTERPRETATION AVAILABLE  IN [place] Confirmed by TEST, Record ([id number]) on 11/06/2015 7:20:34  AM      MDM   Final diagnoses:  New onset a-fib ([place])    New onset afib. Very good historian. Woke up with symptoms 1 hour prior to arrival to ED. Went to bed feeling fine. Cardioverted in ED. Now in sinus. Mild hypokalemia. Supplemented. W/u otherwise fairly unremarkable. TSH midly elevated. Managed by PCP for this. Had three glasses of wine last night. Contributory?  FU with cardiology. Since conversion, HR in low 60s. Beta blocker deferred. In 28s and hx of HTN. Will start on eliquis per cardiology recs.   I personally preformed the services scribed in my presence. The recorded information has been reviewed is accurate. [name], MD.   [name], MD 11/08/15 [phone]

## 2015-11-05 NOTE — Sedation Documentation (Addendum)
PT shocked at 150J synchronized cardioversion; pt tolerated well; pt awake and responds to commands

## 2015-11-06 ENCOUNTER — Encounter: Payer: Self-pay | Admitting: Family Medicine

## 2015-11-06 DIAGNOSIS — I4891 Unspecified atrial fibrillation: Secondary | ICD-10-CM

## 2015-11-07 NOTE — Telephone Encounter (Signed)
Stat referral to cardiology (afib [facility]) placed today

## 2015-11-09 ENCOUNTER — Telehealth: Payer: Self-pay | Admitting: Family Medicine

## 2015-11-09 MED ORDER — RIVAROXABAN 20 MG PO TABS: 20.0000 mg | ORAL_TABLET | Freq: Every day | ORAL | Status: DC

## 2015-11-09 NOTE — Telephone Encounter (Signed)
Can you call to authorize this?

## 2015-11-09 NOTE — Telephone Encounter (Signed)
Since all novel anticoags need a PA and he has new onset Afib, will have him go to our [place] office and pick up Xarelto samples for 20mg  once daily until he can see Afib clinic and they can determine if adjustments need to be made.  In the meantime, we will work on the [place].  Pt was called and made aware that he can get this medication.

## 2015-11-09 NOTE — Telephone Encounter (Signed)
Informed patient of the provider's recommendation below. He voiced understanding and did not have any questions or concerns.

## 2015-11-09 NOTE — Telephone Encounter (Signed)
Patient given month supply of Xarelto 20 mg samples. Added to medication list.

## 2015-11-09 NOTE — Telephone Encounter (Signed)
Called and spoke with insurance all anticoags need a PA, Unsure if this could be completed before pt leaves out of town tomorrow. Please advise?

## 2015-11-09 NOTE — Telephone Encounter (Signed)
Please call patient to make sure he knows not to take NSAIDs with medication. Cannot see where this was discussed by PCP.

## 2015-11-09 NOTE — Telephone Encounter (Signed)
Called pt and informed to contact formulary. Pt stated an understanding.

## 2015-11-09 NOTE — Telephone Encounter (Signed)
Please have pt call his insurance and see if there is a blood thinner similar to Eliquis that they prefer

## 2015-11-09 NOTE — Telephone Encounter (Signed)
Pt called back with number to call for auth [id number]

## 2015-11-09 NOTE — Telephone Encounter (Signed)
Pt states that a prior auth is needed on Eliquis before [facility] will fill.

## 2015-11-10 NOTE — Telephone Encounter (Signed)
Spoke with patient and he is aware that Dr Birdie Riddle suggest we will wait until he goes to his appointment at the [facility] clinic on [phone] before pre-certing any medication. He was given a one month supply of Xeralto we will wait and see if this is the medication that he continues on.

## 2015-11-10 NOTE — Telephone Encounter (Signed)
Can you please try to complete PA on this medication

## 2015-11-23 ENCOUNTER — Ambulatory Visit (INDEPENDENT_AMBULATORY_CARE_PROVIDER_SITE_OTHER): Payer: Medicare Other | Admitting: Cardiology

## 2015-11-23 ENCOUNTER — Other Ambulatory Visit: Payer: Self-pay | Admitting: *Deleted

## 2015-11-23 ENCOUNTER — Encounter: Payer: Self-pay | Admitting: Cardiology

## 2015-11-23 VITALS — BP 156/84 | HR 68 | Ht 71.0 in | Wt 220.8 lb

## 2015-11-23 DIAGNOSIS — I4891 Unspecified atrial fibrillation: Secondary | ICD-10-CM

## 2015-11-23 MED ORDER — RIVAROXABAN 20 MG PO TABS: 20.0000 mg | ORAL_TABLET | Freq: Every day | ORAL | Status: DC

## 2015-11-23 MED ORDER — FLECAINIDE ACETATE 50 MG PO TABS: 75.0000 mg | ORAL_TABLET | Freq: Two times a day (BID) | ORAL | Status: DC

## 2015-11-23 MED ORDER — CARVEDILOL 12.5 MG PO TABS: 12.5000 mg | ORAL_TABLET | Freq: Two times a day (BID) | ORAL | Status: DC

## 2015-11-23 NOTE — Progress Notes (Signed)
Electrophysiology Office Note   Date:  11/23/2015   ID:  Stephen, Morrison 1945/09/03, MRN [id number]  PCP:  Annye Asa, MD  Primary Electrophysiologist:  Will Meredith Leeds, MD    Chief Complaint  Patient presents with  . Atrial Fibrillation  . New Patient (Initial Visit)     History of Present Illness: Stephen Morrison is a 70 y.o. male who presents today for electrophysiology evaluation.   He has a history of HTn, HLD, and AF.  He presented to the ER on 3/25 with one hour of palpitations.  At that time, the palpitations woke him from sleep.  The patient was cardioverted out of AF in the ER.  He was started on Eliquis at that time and told to follow up in clinic but was switched to Xarelto in the interim.  He says that he will get night with severe palpitations. Since being in the emergency room, he is felt well. He has not noted any further palpitations. He has never had chest pain, shortness of breath, PND, or orthopnea. He is able to play golf and walk without restriction.    Today, he denies symptoms of palpitations, chest pain, shortness of breath, orthopnea, PND, lower extremity edema, claudication, dizziness, presyncope, syncope, bleeding, or neurologic sequela. The patient is tolerating medications without difficulties and is otherwise without complaint today.    Past Medical History  Diagnosis Date  . Hyperlipidemia   . Hypertension   . PSA elevation   . Gout     STABLE PER PT ON 05-12-2014  . BPH (benign prostatic hyperplasia)   . Urinary retention   . Foley catheter in place   . Diverticulosis of colon   . Hypothyroidism   . GERD (gastroesophageal reflux disease)    Past Surgical History  Procedure Laterality Date  . Colonoscopy  01-31-2007  . Orif right wrist fx  2009  . Excision benign cyst posterior neck  2010  . Transurethral resection of prostate N/A 05/17/2014    Procedure: TRANSURETHRAL RESECTION OF THE PROSTATE WITH GYRUS INSTRUMENTS;  Surgeon: Bernestine Amass, MD;  Location: [facility];  Service: Urology;  Laterality: N/A;  . Insertion of suprapubic catheter N/A 05/17/2014    Procedure: INSERTION OF SUPRAPUBIC CATHETER;  Surgeon: Bernestine Amass, MD;  Location: [facility];  Service: Urology;  Laterality: N/A;     Current Outpatient Prescriptions  Medication Sig Dispense Refill  . acetaminophen (TYLENOL) 500 MG tablet Take 1,000 mg by mouth every 6 (six) hours as needed for fever.    Marland Kitchen amLODipine (NORVASC) 5 MG tablet TAKE 1 TABLET DAILY (Patient taking differently: TAKE 5 MG BY MOUTH  DAILY) 90 tablet 1  . cetirizine (ZYRTEC) 10 MG tablet Take 10 mg by mouth as needed for allergies.     Marland Kitchen gabapentin (NEURONTIN) 300 MG capsule Take 1 capsule (300 mg total) by mouth at bedtime. 30 capsule 1  . hydrALAZINE (APRESOLINE) 10 MG tablet Take 1 tablet (10 mg total) by mouth 2 (two) times daily. 60 tablet 6  . ibuprofen (ADVIL,MOTRIN) 200 MG tablet Take 400 mg by mouth every 6 (six) hours as needed for fever.    . levothyroxine (SYNTHROID, LEVOTHROID) 50 MCG tablet Take 1 tablet (50 mcg total) by mouth daily. 30 tablet 1  . omeprazole (PRILOSEC) 20 MG capsule Take 20 mg by mouth daily as needed (acid reflux).    . rivaroxaban (XARELTO) 20 MG TABS tablet Take 1 tablet (20  mg total) by mouth daily with supper. 30 tablet 0  . traMADol (ULTRAM) 50 MG tablet Take 1 tablet (50 mg total) by mouth every 6 (six) hours as needed. 10 tablet 0  . valsartan-hydrochlorothiazide (DIOVAN-HCT) 320-12.5 MG tablet TAKE 1 TABLET DAILY 90 tablet 1  . VYTORIN 10-40 MG tablet TAKE 1 TABLET MONDAY, WEDNESDAY, AND FRIDAY 90 tablet 0   No current facility-administered medications for this visit.    Allergies:   Amoxicillin   Social History:  The patient  reports that he quit smoking about 42 years ago. His smoking use included Cigarettes. He has a 10 pack-year smoking history. He quit smokeless tobacco use about 41 years ago. His smokeless  tobacco use included Snuff and Chew. He reports that he drinks alcohol. He reports that he does not use illicit drugs.   Family History:  The patient's family history includes Alcohol abuse in his father; Dementia in his father; Hyperlipidemia in his mother; Hypertension in his mother.    ROS:  Please see the history of present illness.   Otherwise, review of systems is positive for palpitations, cough, back pain, easy bruising.   All other systems are reviewed and negative.    PHYSICAL EXAM: VS:  BP 156/84 mmHg  Pulse 68  Ht 5\' 11"  (1.803 m)  Wt 220 lb 12.8 oz (100.154 kg)  BMI 30.81 kg/m2 , BMI Body mass index is 30.81 kg/(m^2). GEN: Well nourished, well developed, in no acute distress HEENT: normal Neck: no JVD, carotid bruits, or masses Cardiac: RRR; no murmurs, rubs, or gallops,no edema  Respiratory:  clear to auscultation bilaterally, normal work of breathing GI: soft, nontender, nondistended, + BS MS: no deformity or atrophy Skin: warm and dry Neuro:  Strength and sensation are intact Psych: euthymic mood, full affect  EKG:  EKG is ordered today. The ekg ordered today shows sinus rhythm, nonspecific T wave changes, rate 68  Recent Labs: 10/03/2015: ALT 57* 11/05/2015: BUN 13; Creatinine, Ser 1.10; Hemoglobin 17.7*; Magnesium 2.3; Platelets 319; Potassium 3.3*; Sodium 140; TSH 5.593*    Lipid Panel     Component Value Date/Time   CHOL 174 10/03/2015 0851   TRIG 180.0* 10/03/2015 0851   HDL 35.20* 10/03/2015 0851   CHOLHDL 5 10/03/2015 0851   VLDL 36.0 10/03/2015 0851   LDLCALC 103* 10/03/2015 0851   LDLDIRECT 123.3 06/01/2013 1237     Wt Readings from Last 3 Encounters:  11/23/15 220 lb 12.8 oz (100.154 kg)  11/05/15 222 lb 10.6 oz (101 kg)  10/03/15 222 lb 9.6 oz (100.971 kg)      Other studies Reviewed: Additional studies/ records that were reviewed today include: ER records   ASSESSMENT AND PLAN:  1.  Atrial fibrillation: Patient has risk factors of  hypertension and age. He says he is felt well since he was recently cardioverted out of atrial fibrillation. He tried to take Eliquis, but insurance wanted preauthorization. He was given samples of rivaroxaban which she has been taking and has not missed any doses. He will prefer to have a rhythm control strategy at this time. I will therefore start him on carvedilol 12.5 mg, as his blood pressure is elevated, and flecainide 75 mg twice a day. We will also get an echocardiogram today and get a treadmill test in 1 week. I have told him that if he goes back into atrial fibrillation that he can call clinic for further recommendations.  2. Hypertension: Elevated today. Will add carvedilol.  Current medicines are reviewed  at length with the patient today.   The patient does not have concerns regarding his medicines.  The following changes were made today:  Flecainide, coreg, Xarelto  Labs/ tests ordered today include:  No orders of the defined types were placed in this encounter.     Disposition:   FU with Will Camnitz 3 months  Signed, Will Meredith Leeds, MD  11/23/2015 2:01 PM     [facility] [address] [place] [address] [phone] (office) [phone] (fax)

## 2015-11-23 NOTE — Patient Instructions (Signed)
Medication Instructions:  Please start Flecainide 50 mg (1 and 1/2 tablets) twice a day. Start Carvedilol 12.5 mg twice a day. Continue all other medications as listed.  Testing/Procedures: Your physician has requested that you have an echocardiogram. Echocardiography is a painless test that uses sound waves to create images of your heart. It provides your doctor with information about the size and shape of your heart and how well your heart's chambers and valves are working. This procedure takes approximately one hour. There are no restrictions for this procedure.  Your physician has requested that you have an exercise tolerance test in 7 to 10 days after starting Flecainide. For further information please visit [email or web address]. Please also follow instruction sheet, as given.  Follow-Up: Follow up in 3 months with Dr Curt Bears.   If you need a refill on your cardiac medications before your next appointment, please call your pharmacy.  Thank you for choosing [facility]!!

## 2015-11-23 NOTE — Telephone Encounter (Signed)
[facility], pt changed his mind after [facility], he [facility]. [facility] and [facility] Script[facility].

## 2015-11-25 ENCOUNTER — Telehealth: Payer: Self-pay

## 2015-11-25 NOTE — Telephone Encounter (Signed)
Prior auth for Xarelto 20mg  faxed to [employer].

## 2015-11-25 NOTE — Telephone Encounter (Signed)
Prior auth for Xarelto 20mg  submitted to [employer].

## 2015-11-28 ENCOUNTER — Other Ambulatory Visit: Payer: Self-pay | Admitting: General Practice

## 2015-11-28 MED ORDER — LEVOTHYROXINE SODIUM 50 MCG PO TABS: 50.0000 ug | ORAL_TABLET | Freq: Every day | ORAL | Status: DC

## 2015-11-29 ENCOUNTER — Telehealth: Payer: Self-pay

## 2015-11-29 NOTE — Telephone Encounter (Signed)
Xarelto approved by [facility]. Case ID [id number].

## 2015-12-02 ENCOUNTER — Ambulatory Visit (INDEPENDENT_AMBULATORY_CARE_PROVIDER_SITE_OTHER): Payer: Medicare Other

## 2015-12-02 DIAGNOSIS — I4891 Unspecified atrial fibrillation: Secondary | ICD-10-CM

## 2015-12-02 LAB — EXERCISE TOLERANCE TEST
CHL CUP MPHR: 149 {beats}/min
CHL CUP STRESS STAGE 1 DBP: 80 mmHg
CHL CUP STRESS STAGE 3 GRADE: 0 %
CHL CUP STRESS STAGE 3 HR: 52 {beats}/min
CHL CUP STRESS STAGE 3 SPEED: 1 mph
CHL CUP STRESS STAGE 4 DBP: 75 mmHg
CHL CUP STRESS STAGE 4 SBP: 158 mmHg
CHL CUP STRESS STAGE 4 SPEED: 1.7 mph
CHL CUP STRESS STAGE 5 HR: 88 {beats}/min
CHL CUP STRESS STAGE 7 GRADE: 0 %
CHL CUP STRESS STAGE 7 SBP: 179 mmHg
CHL CUP STRESS STAGE 7 SPEED: 0 mph
CHL CUP STRESS STAGE 8 DBP: 86 mmHg
CHL CUP STRESS STAGE 8 GRADE: 0 %
CHL CUP STRESS STAGE 8 SBP: 181 mmHg
CHL CUP STRESS STAGE 8 SPEED: 0 mph
CHL RATE OF PERCEIVED EXERTION: 16
CSEPED: 7 min
CSEPEDS: 12 s
Estimated workload: 8.7 METS
Peak HR: 98 {beats}/min
Percent HR: 65 %
Percent of predicted max HR: 65 %
Rest HR: 48 {beats}/min
Stage 1 Grade: 0 %
Stage 1 HR: 50 {beats}/min
Stage 1 SBP: 143 mmHg
Stage 1 Speed: 0 mph
Stage 2 Grade: 0 %
Stage 2 HR: 51 {beats}/min
Stage 2 Speed: 1 mph
Stage 4 Grade: 10 %
Stage 4 HR: 75 {beats}/min
Stage 5 DBP: 74 mmHg
Stage 5 Grade: 12 %
Stage 5 SBP: 174 mmHg
Stage 5 Speed: 2.5 mph
Stage 6 Grade: 14 %
Stage 6 HR: 98 {beats}/min
Stage 6 Speed: 3.3 mph
Stage 7 DBP: 90 mmHg
Stage 7 HR: 84 {beats}/min
Stage 8 HR: 55 {beats}/min

## 2015-12-09 ENCOUNTER — Ambulatory Visit (HOSPITAL_COMMUNITY): Payer: Medicare Other | Attending: Cardiovascular Disease

## 2015-12-09 ENCOUNTER — Other Ambulatory Visit: Payer: Self-pay

## 2015-12-09 DIAGNOSIS — E785 Hyperlipidemia, unspecified: Secondary | ICD-10-CM | POA: Diagnosis not present

## 2015-12-09 DIAGNOSIS — I4891 Unspecified atrial fibrillation: Secondary | ICD-10-CM | POA: Diagnosis not present

## 2015-12-09 DIAGNOSIS — Z87891 Personal history of nicotine dependence: Secondary | ICD-10-CM | POA: Diagnosis not present

## 2015-12-09 DIAGNOSIS — I119 Hypertensive heart disease without heart failure: Secondary | ICD-10-CM | POA: Insufficient documentation

## 2015-12-13 ENCOUNTER — Encounter: Payer: Self-pay | Admitting: Cardiology

## 2015-12-19 ENCOUNTER — Encounter: Payer: Medicare Other | Admitting: Family Medicine

## 2016-01-30 ENCOUNTER — Other Ambulatory Visit: Payer: Self-pay | Admitting: Family Medicine

## 2016-01-30 NOTE — Telephone Encounter (Signed)
Medication filled to pharmacy as requested.   

## 2016-02-21 NOTE — Progress Notes (Signed)
Electrophysiology Office Note   Date:  02/22/2016   ID:  Stephen, Morrison 1945-09-09, MRN [id number]  PCP:  Annye Asa, MD  Primary Electrophysiologist:  Maygan Koeller Meredith Leeds, MD    Chief Complaint  Patient presents with  . Follow-up    3 month     History of Present Illness: Stephen Morrison is a 70 y.o. male who presents today for electrophysiology evaluation.   He has a history of HTn, HLD, and AF.  He presented to the ER on 3/25 with one hour of palpitations.  At that time, the palpitations woke him from sleep.  The patient was cardioverted out of AF in the ER.  He was started on Eliquis at that time and told to follow up in clinic but was switched to Xarelto in the interim.  He is feeling well without any major complaints. He has not noticed any further issues with palpitations or tachycardia. He says that he is doing his daily activities without any major symptoms.  Today, he denies symptoms of palpitations, chest pain, shortness of breath, orthopnea, PND, lower extremity edema, claudication, dizziness, presyncope, syncope, bleeding, or neurologic sequela. The patient is tolerating medications without difficulties and is otherwise without complaint today.    Past Medical History  Diagnosis Date  . Hyperlipidemia   . Hypertension   . PSA elevation   . Gout     STABLE PER PT ON 05-12-2014  . BPH (benign prostatic hyperplasia)   . Urinary retention   . Foley catheter in place   . Diverticulosis of colon   . Hypothyroidism   . GERD (gastroesophageal reflux disease)    Past Surgical History  Procedure Laterality Date  . Colonoscopy  01-31-2007  . Orif right wrist fx  2009  . Excision benign cyst posterior neck  2010  . Transurethral resection of prostate N/A 05/17/2014    Procedure: TRANSURETHRAL RESECTION OF THE PROSTATE WITH GYRUS INSTRUMENTS;  Surgeon: Bernestine Amass, MD;  Location: [facility];  Service: Urology;  Laterality: N/A;  . Insertion of  suprapubic catheter N/A 05/17/2014    Procedure: INSERTION OF SUPRAPUBIC CATHETER;  Surgeon: Bernestine Amass, MD;  Location: [facility];  Service: Urology;  Laterality: N/A;     Current Outpatient Prescriptions  Medication Sig Dispense Refill  . acetaminophen (TYLENOL) 500 MG tablet Take 1,000 mg by mouth every 6 (six) hours as needed for fever.    Marland Kitchen amLODipine (NORVASC) 5 MG tablet Take 5 mg by mouth daily.    . carvedilol (COREG) 12.5 MG tablet Take 1 tablet (12.5 mg total) by mouth 2 (two) times daily. 60 tablet 11  . cetirizine (ZYRTEC) 10 MG tablet Take 10 mg by mouth as needed for allergies.     . flecainide (TAMBOCOR) 50 MG tablet Take 1.5 tablets (75 mg total) by mouth 2 (two) times daily. 90 tablet 11  . gabapentin (NEURONTIN) 300 MG capsule Take 1 capsule (300 mg total) by mouth at bedtime. 30 capsule 1  . hydrALAZINE (APRESOLINE) 10 MG tablet Take 1 tablet (10 mg total) by mouth 2 (two) times daily. 60 tablet 6  . levothyroxine (SYNTHROID, LEVOTHROID) 50 MCG tablet Take 1 tablet (50 mcg total) by mouth daily. 30 tablet 6  . omeprazole (PRILOSEC) 20 MG capsule Take 20 mg by mouth daily as needed (acid reflux).    . rivaroxaban (XARELTO) 20 MG TABS tablet Take 1 tablet (20 mg total) by mouth daily with supper. [place]  tablet 3  . traMADol (ULTRAM) 50 MG tablet Take 1 tablet (50 mg total) by mouth every 6 (six) hours as needed. 10 tablet 0  . valsartan-hydrochlorothiazide (DIOVAN-HCT) 320-12.5 MG tablet TAKE 1 TABLET DAILY 90 tablet 1  . VYTORIN 10-40 MG tablet TAKE 1 TABLET MONDAY, WEDNESDAY, AND FRIDAY 90 tablet 1   No current facility-administered medications for this visit.    Allergies:   Amoxicillin   Social History:  The patient  reports that he quit smoking about 42 years ago. His smoking use included Cigarettes. He has a 10 pack-year smoking history. He quit smokeless tobacco use about 41 years ago. His smokeless tobacco use included Snuff and Chew. He reports that  he drinks alcohol. He reports that he does not use illicit drugs.   Family History:  The patient's family history includes Alcohol abuse in his father; Dementia in his father; Hyperlipidemia in his mother; Hypertension in his mother.    ROS:  Please see the history of present illness.   Otherwise, review of systems is positive for easy bruising.   All other systems are reviewed and negative.    PHYSICAL EXAM: VS:  BP 128/82 mmHg  Pulse 49  Ht 5\' 11"  (1.803 m)  Wt 223 lb (101.152 kg)  BMI 31.12 kg/m2 , BMI Body mass index is 31.12 kg/(m^2). GEN: Well nourished, well developed, in no acute distress HEENT: normal Neck: no JVD, carotid bruits, or masses Cardiac: RRR; no murmurs, rubs, or gallops,no edema  Respiratory:  clear to auscultation bilaterally, normal work of breathing GI: soft, nontender, nondistended, + BS MS: no deformity or atrophy Skin: warm and dry Neuro:  Strength and sensation are intact Psych: euthymic mood, full affect  EKG:  EKG is ordered today. The ekg ordered today shows Sinus rhythm, rate 49, 1 degree AV block  Recent Labs: 10/03/2015: ALT 57* 11/05/2015: BUN 13; Creatinine, Ser 1.10; Hemoglobin 17.7*; Magnesium 2.3; Platelets 319; Potassium 3.3*; Sodium 140; TSH 5.593*    Lipid Panel     Component Value Date/Time   CHOL 174 10/03/2015 0851   TRIG 180.0* 10/03/2015 0851   HDL 35.20* 10/03/2015 0851   CHOLHDL 5 10/03/2015 0851   VLDL 36.0 10/03/2015 0851   LDLCALC 103* 10/03/2015 0851   LDLDIRECT 123.3 06/01/2013 1237     Wt Readings from Last 3 Encounters:  02/22/16 223 lb (101.152 kg)  11/23/15 220 lb 12.8 oz (100.154 kg)  11/05/15 222 lb 10.6 oz (101 kg)      Other studies Reviewed: Additional studies/ records that were reviewed today include:  TTE 12/09/15 - Left ventricle: The cavity size was normal. Wall thickness was  increased in a pattern of mild LVH. Systolic function was normal.  The estimated ejection fraction was in the range of  60% to 65%.  Wall motion was normal; there were no regional wall motion  abnormalities.    ASSESSMENT AND PLAN:  1.  Atrial fibrillation: Patient has risk factors of hypertension and age. He says he is felt well since he was recently cardioverted out of atrial fibrillation. On rivaroxaban. On coreg and Flecainide.  Echocardiogram showed no major issues. He is feeling well without any complaints. We'll continue his flecainide and carvedilol.  2. Hypertension: Well-controlled today. He is a little bit bradycardic but he is not having symptoms from bradycardia. We'll continue his carvedilol.  Current medicines are reviewed at length with the patient today.   The patient does not have concerns regarding his medicines.  The following  changes were made today:  none  Labs/ tests ordered today include:  No orders of the defined types were placed in this encounter.     Disposition:   FU with Stephen Morrison 6 months  Signed, Tabytha Gradillas Meredith Leeds, MD  02/22/2016 9:34 AM     [facility] [address] [place] [address] [phone] (office) [phone] (fax)

## 2016-02-22 ENCOUNTER — Encounter: Payer: Self-pay | Admitting: Cardiology

## 2016-02-22 ENCOUNTER — Ambulatory Visit (INDEPENDENT_AMBULATORY_CARE_PROVIDER_SITE_OTHER): Payer: Medicare Other | Admitting: Cardiology

## 2016-02-22 VITALS — BP 128/82 | HR 49 | Ht 71.0 in | Wt 223.0 lb

## 2016-02-22 DIAGNOSIS — I480 Paroxysmal atrial fibrillation: Secondary | ICD-10-CM | POA: Diagnosis not present

## 2016-02-22 MED ORDER — CARVEDILOL 12.5 MG PO TABS: 12.5000 mg | ORAL_TABLET | Freq: Two times a day (BID) | ORAL | Status: DC

## 2016-02-22 MED ORDER — FLECAINIDE ACETATE 50 MG PO TABS: 75.0000 mg | ORAL_TABLET | Freq: Two times a day (BID) | ORAL | Status: DC

## 2016-02-22 NOTE — Patient Instructions (Signed)
Medication Instructions:    Your physician recommends that you continue on your current medications as directed. Please refer to the Current Medication list given to you today.  Labwork:  None ordered  Testing/Procedures:  None ordered  Follow-Up:  Your physician wants you to follow-up in: 6 months with Dr. Camnitz.  You will receive a reminder letter in the mail two months in advance. If you don't receive a letter, please call our office to schedule the follow-up appointment.  - If you need a refill on your cardiac medications before your next appointment, please call your pharmacy.    Thank you for choosing [facility]!!   Sherri Price, RN [phone]         

## 2016-02-25 ENCOUNTER — Encounter: Payer: Self-pay | Admitting: Family Medicine

## 2016-02-25 ENCOUNTER — Ambulatory Visit (INDEPENDENT_AMBULATORY_CARE_PROVIDER_SITE_OTHER): Payer: Medicare Other | Admitting: Family Medicine

## 2016-02-25 VITALS — BP 160/94 | HR 59 | Temp 99.4°F | Resp 14 | Wt 223.5 lb

## 2016-02-25 DIAGNOSIS — W57XXXA Bitten or stung by nonvenomous insect and other nonvenomous arthropods, initial encounter: Secondary | ICD-10-CM | POA: Diagnosis not present

## 2016-02-25 DIAGNOSIS — I10 Essential (primary) hypertension: Secondary | ICD-10-CM

## 2016-02-25 DIAGNOSIS — T148 Other injury of unspecified body region: Secondary | ICD-10-CM

## 2016-02-25 HISTORY — DX: Bitten or stung by nonvenomous insect and other nonvenomous arthropods, initial encounter: W57.XXXA

## 2016-02-25 MED ORDER — DOXYCYCLINE HYCLATE 100 MG PO TABS: 100.0000 mg | ORAL_TABLET | Freq: Two times a day (BID) | ORAL | Status: DC

## 2016-02-25 NOTE — Progress Notes (Signed)
Pre visit review using our clinic review tool, if applicable. No additional management support is needed unless otherwise documented below in the visit note. 

## 2016-02-25 NOTE — Assessment & Plan Note (Signed)
Symptomatic so will start on Doxycycline 100 mg pob bid

## 2016-02-25 NOTE — Patient Instructions (Signed)
Probiotic such as [place] or Digestive Advantage for next month  Tick Bite Information Ticks are insects that attach themselves to the skin and draw blood for food. There are various types of ticks. Common types include wood ticks and deer ticks. Most ticks live in shrubs and grassy areas. Ticks can climb onto your body when you make contact with leaves or grass where the tick is waiting. The most common places on the body for ticks to attach themselves are the scalp, neck, armpits, waist, and groin. Most tick bites are harmless, but sometimes ticks carry germs that cause diseases. These germs can be spread to a person during the tick's feeding process. The chance of a disease spreading through a tick bite depends on:   The type of tick.  Time of year.   How long the tick is attached.   Geographic location.  HOW CAN YOU PREVENT TICK BITES? Take these steps to help prevent tick bites when you are outdoors:  Wear protective clothing. Long sleeves and long pants are best.   Wear white clothes so you can see ticks more easily.  Tuck your pant legs into your socks.   If walking on a trail, stay in the middle of the trail to avoid brushing against bushes.  Avoid walking through areas with long grass.  Put insect repellent on all exposed skin and along boot tops, pant legs, and sleeve cuffs.   Check clothing, hair, and skin repeatedly and before going inside.   Brush off any ticks that are not attached.  Take a shower or bath as soon as possible after being outdoors.  WHAT IS THE PROPER WAY TO REMOVE A TICK? Ticks should be removed as soon as possible to help prevent diseases caused by tick bites. 1. If latex gloves are available, put them on before trying to remove a tick.  2. Using fine-point tweezers, grasp the tick as close to the skin as possible. You may also use curved forceps or a tick removal tool. Grasp the tick as close to its head as possible. Avoid  grasping the tick on its body. 3. Pull gently with steady upward pressure until the tick lets go. Do not twist the tick or jerk it suddenly. This may break off the tick's head or mouth parts. 4. Do not squeeze or crush the tick's body. This could force disease-carrying fluids from the tick into your body.  5. After the tick is removed, wash the bite area and your hands with soap and water or other disinfectant such as alcohol. 6. Apply a small amount of antiseptic cream or ointment to the bite site.  7. Wash and disinfect any instruments that were used.  Do not try to remove a tick by applying a hot match, petroleum jelly, or fingernail polish to the tick. These methods do not work and may increase the chances of disease being spread from the tick bite.  WHEN SHOULD YOU SEEK MEDICAL CARE? Contact your health care provider if you are unable to remove a tick from your skin or if a part of the tick breaks off and is stuck in the skin.  After a tick bite, you need to be aware of signs and symptoms that could be related to diseases spread by ticks. Contact your health care provider if you develop any of the following in the days or weeks after the tick bite:  Unexplained fever.  Rash. A circular rash that appears days or weeks after the  tick bite may indicate the possibility of Lyme disease. The rash may resemble a target with a bull's-eye and may occur at a different part of your body than the tick bite.  Redness and swelling in the area of the tick bite.   Tender, swollen lymph glands.   Diarrhea.   Weight loss.   Cough.   Fatigue.   Muscle, joint, or bone pain.   Abdominal pain.   Headache.   Lethargy or a change in your level of consciousness.  Difficulty walking or moving your legs.   Numbness in the legs.   Paralysis.  Shortness of breath.   Confusion.   Repeated vomiting.    This information is not intended to replace advice given to you by your  health care provider. Make sure you discuss any questions you have with your health care provider.   Document Released: 07/27/2000 Document Revised: 08/20/2014 Document Reviewed: 01/07/2013 Elsevier Interactive Patient Education [employer].

## 2016-02-25 NOTE — Assessment & Plan Note (Addendum)
Elevated today but did not take his Diovan today. Encouraged to go home

## 2016-02-25 NOTE — Progress Notes (Signed)
Patient ID: Stephen Morrison, male   DOB: Nov 29, 1945, 70 y.o.   MRN: [id number]   Subjective:    Patient ID: Stephen Morrison, male    DOB: 13-Feb-1946, 70 y.o.   MRN: [id number]  Chief Complaint  Patient presents with  . Tick Bite    HPI Patient is in today for evaluation of a tick bite. He had a tick bite on his left shoulder. He reports it was a small tick and he is unsure how long it was present. He has a red itchy rash there now. Tick was removed on 02/07/2016. He endorses flu like symptoms such as fevers, chills, malaise, fatigue as well. Denies CP/palp/SOB/HA/congestion/GI or GU c/o. Taking meds as prescribed  Past Medical History  Diagnosis Date  . Hyperlipidemia   . Hypertension   . PSA elevation   . Gout     STABLE PER PT ON 05-12-2014  . BPH (benign prostatic hyperplasia)   . Urinary retention   . Foley catheter in place   . Diverticulosis of colon   . Hypothyroidism   . GERD (gastroesophageal reflux disease)   . Tick bite 02/25/2016    Past Surgical History  Procedure Laterality Date  . Colonoscopy  01-31-2007  . Orif right wrist fx  2009  . Excision benign cyst posterior neck  2010  . Transurethral resection of prostate N/A 05/17/2014    Procedure: TRANSURETHRAL RESECTION OF THE PROSTATE WITH GYRUS INSTRUMENTS;  Surgeon: Bernestine Amass, MD;  Location: [facility];  Service: Urology;  Laterality: N/A;  . Insertion of suprapubic catheter N/A 05/17/2014    Procedure: INSERTION OF SUPRAPUBIC CATHETER;  Surgeon: Bernestine Amass, MD;  Location: [facility];  Service: Urology;  Laterality: N/A;    Family History  Problem Relation Age of Onset  . Alcohol abuse Father   . Dementia Father   . Hyperlipidemia Mother   . Hypertension Mother     Social History   Social History  . Marital Status: Married    Spouse Name: N/A  . Number of Children: N/A  . Years of Education: N/A   Occupational History  . Not on file.   Social History Main Topics    . Smoking status: Former Smoker -- 1.00 packs/day for 10 years    Types: Cigarettes    Quit date: 08/13/1973  . Smokeless tobacco: Former Systems developer    Types: Snuff, Chew    Quit date: 05/12/1974  . Alcohol Use: 0.0 oz/week    0 Standard drinks or equivalent per week     Comment: OCCASIONALLY  . Drug Use: No  . Sexual Activity: Not on file   Other Topics Concern  . Not on file   Social History Narrative    Outpatient Prescriptions Prior to Visit  Medication Sig Dispense Refill  . acetaminophen (TYLENOL) 500 MG tablet Take 1,000 mg by mouth every 6 (six) hours as needed for fever.    Marland Kitchen amLODipine (NORVASC) 5 MG tablet Take 5 mg by mouth daily.    . carvedilol (COREG) 12.5 MG tablet Take 1 tablet (12.5 mg total) by mouth 2 (two) times daily. 180 tablet 3  . cetirizine (ZYRTEC) 10 MG tablet Take 10 mg by mouth as needed for allergies.     . flecainide (TAMBOCOR) 50 MG tablet Take 1.5 tablets (75 mg total) by mouth 2 (two) times daily. 270 tablet 3  . gabapentin (NEURONTIN) 300 MG capsule Take 1 capsule (300 mg total) by mouth  at bedtime. 30 capsule 1  . hydrALAZINE (APRESOLINE) 10 MG tablet Take 1 tablet (10 mg total) by mouth 2 (two) times daily. 60 tablet 6  . levothyroxine (SYNTHROID, LEVOTHROID) 50 MCG tablet Take 1 tablet (50 mcg total) by mouth daily. 30 tablet 6  . omeprazole (PRILOSEC) 20 MG capsule Take 20 mg by mouth daily as needed (acid reflux).    . rivaroxaban (XARELTO) 20 MG TABS tablet Take 1 tablet (20 mg total) by mouth daily with supper. 90 tablet 3  . traMADol (ULTRAM) 50 MG tablet Take 1 tablet (50 mg total) by mouth every 6 (six) hours as needed. 10 tablet 0  . valsartan-hydrochlorothiazide (DIOVAN-HCT) 320-12.5 MG tablet TAKE 1 TABLET DAILY 90 tablet 1  . VYTORIN 10-40 MG tablet TAKE 1 TABLET MONDAY, WEDNESDAY, AND FRIDAY 90 tablet 1   No facility-administered medications prior to visit.     Review of Systems  Constitutional: Positive for fever, chills and  malaise/fatigue.  HENT: Negative for congestion.   Eyes: Negative for blurred vision.  Respiratory: Negative for shortness of breath.   Cardiovascular: Negative for chest pain, palpitations, orthopnea and leg swelling.  Gastrointestinal: Negative for nausea, abdominal pain and blood in stool.  Genitourinary: Negative for dysuria and frequency.  Musculoskeletal: Positive for myalgias. Negative for falls.  Skin: Positive for itching and rash.  Neurological: Positive for weakness. Negative for dizziness, loss of consciousness and headaches.  Endo/Heme/Allergies: Negative for environmental allergies.  Psychiatric/Behavioral: Negative for depression. The patient is not nervous/anxious.        Objective:    Physical Exam  Constitutional: He is oriented to person, place, and time. He appears well-developed and well-nourished. No distress.  HENT:  Head: Normocephalic and atraumatic.  Nose: Nose normal.  Eyes: Right eye exhibits no discharge. Left eye exhibits no discharge.  Neck: Normal range of motion. Neck supple.  Cardiovascular: Normal rate and regular rhythm.   No murmur heard. Pulmonary/Chest: Effort normal and breath sounds normal.  Abdominal: Soft. Bowel sounds are normal. There is no tenderness.  Musculoskeletal: He exhibits no edema.  Neurological: He is alert and oriented to person, place, and time.  Skin: Skin is warm and dry.  1 cm raised, red circular lesion on back of left shoulder  Psychiatric: He has a normal mood and affect.  Nursing note and vitals reviewed.   BP 160/94 mmHg  Pulse 59  Temp(Src) 99.4 F (37.4 C) (Oral)  Resp 14  Wt 223 lb 8 oz (101.379 kg)  SpO2 96% Wt Readings from Last 3 Encounters:  02/25/16 223 lb 8 oz (101.379 kg)  02/22/16 223 lb (101.152 kg)  11/23/15 220 lb 12.8 oz (100.154 kg)     Lab Results  Component Value Date   WBC 7.0 11/05/2015   HGB 17.7* 11/05/2015   HCT 50.2 11/05/2015   PLT 319 11/05/2015   GLUCOSE 118* 11/05/2015    CHOL 174 10/03/2015   TRIG 180.0* 10/03/2015   HDL 35.20* 10/03/2015   LDLDIRECT 123.3 06/01/2013   LDLCALC 103* 10/03/2015   ALT 57* 10/03/2015   AST 35 10/03/2015   NA 140 11/05/2015   K 3.3* 11/05/2015   CL 102 11/05/2015   CREATININE 1.10 11/05/2015   BUN 13 11/05/2015   CO2 26 11/05/2015   TSH 5.593* 11/05/2015   PSA 2.38 12/02/2013    Lab Results  Component Value Date   TSH 5.593* 11/05/2015   Lab Results  Component Value Date   WBC 7.0 11/05/2015   HGB  17.7* 11/05/2015   HCT 50.2 11/05/2015   MCV 85.8 11/05/2015   PLT 319 11/05/2015   Lab Results  Component Value Date   NA 140 11/05/2015   K 3.3* 11/05/2015   CO2 26 11/05/2015   GLUCOSE 118* 11/05/2015   BUN 13 11/05/2015   CREATININE 1.10 11/05/2015   BILITOT 0.6 10/03/2015   ALKPHOS 43 10/03/2015   AST 35 10/03/2015   ALT 57* 10/03/2015   PROT 7.1 10/03/2015   ALBUMIN 4.2 10/03/2015   CALCIUM 9.0 11/05/2015   ANIONGAP 12 11/05/2015   GFR 68.62 10/03/2015   Lab Results  Component Value Date   CHOL 174 10/03/2015   Lab Results  Component Value Date   HDL 35.20* 10/03/2015   Lab Results  Component Value Date   LDLCALC 103* 10/03/2015   Lab Results  Component Value Date   TRIG 180.0* 10/03/2015   Lab Results  Component Value Date   CHOLHDL 5 10/03/2015   No results found for: HGBA1C     Assessment & Plan:   Problem List Items Addressed This Visit    Essential hypertension - Primary    Elevated today but did not take his Diovan today. Encouraged to go home       Tick bite    Symptomatic so will start on Doxycycline 100 mg pob bid         I am having Mr. Rivenburg start on doxycycline. I am also having him maintain his cetirizine, traMADol, acetaminophen, valsartan-hydrochlorothiazide, gabapentin, hydrALAZINE, omeprazole, rivaroxaban, levothyroxine, VYTORIN, amLODipine, carvedilol, and flecainide.  Meds ordered this encounter  Medications  . doxycycline (VIBRA-TABS) 100 MG  tablet    Sig: Take 1 tablet (100 mg total) by mouth 2 (two) times daily.    Dispense:  20 tablet    Refill:  0     Penni Homans, MD

## 2016-03-03 ENCOUNTER — Other Ambulatory Visit: Payer: Self-pay | Admitting: Family Medicine

## 2016-03-05 ENCOUNTER — Other Ambulatory Visit: Payer: Self-pay | Admitting: General Practice

## 2016-03-05 MED ORDER — HYDRALAZINE HCL 10 MG PO TABS: 10.0000 mg | ORAL_TABLET | Freq: Two times a day (BID) | ORAL | 1 refills | Status: DC

## 2016-03-05 MED ORDER — LEVOTHYROXINE SODIUM 50 MCG PO TABS: 50.0000 ug | ORAL_TABLET | Freq: Every day | ORAL | 1 refills | Status: DC

## 2016-03-05 NOTE — Telephone Encounter (Signed)
Medication filled to pharmacy as requested.   

## 2016-03-11 ENCOUNTER — Other Ambulatory Visit: Payer: Self-pay | Admitting: Family Medicine

## 2016-04-02 ENCOUNTER — Encounter: Payer: Private Health Insurance - Indemnity | Admitting: Family Medicine

## 2016-04-17 ENCOUNTER — Encounter: Payer: Self-pay | Admitting: Cardiology

## 2016-04-18 ENCOUNTER — Ambulatory Visit: Payer: Medicare Other | Admitting: Cardiology

## 2016-04-18 NOTE — Telephone Encounter (Addendum)
Reports that complaints have worsened over the last month.  States he used to be fairly active and now he has to push himself to do things. Discussed w/ Camnitz Appt made for 9/12 to discuss with patient. Patient thanks me for helping.

## 2016-04-19 ENCOUNTER — Encounter: Payer: Self-pay | Admitting: Cardiology

## 2016-04-24 ENCOUNTER — Ambulatory Visit (INDEPENDENT_AMBULATORY_CARE_PROVIDER_SITE_OTHER): Payer: Medicare Other | Admitting: Cardiology

## 2016-04-24 ENCOUNTER — Encounter: Payer: Self-pay | Admitting: Cardiology

## 2016-04-24 VITALS — BP 136/80 | HR 49 | Ht 70.5 in | Wt 223.8 lb

## 2016-04-24 DIAGNOSIS — R5383 Other fatigue: Secondary | ICD-10-CM | POA: Diagnosis not present

## 2016-04-24 DIAGNOSIS — I480 Paroxysmal atrial fibrillation: Secondary | ICD-10-CM | POA: Diagnosis not present

## 2016-04-24 MED ORDER — CARVEDILOL 12.5 MG PO TABS: 6.2500 mg | ORAL_TABLET | Freq: Two times a day (BID) | ORAL | 3 refills | Status: DC

## 2016-04-24 NOTE — Progress Notes (Signed)
Electrophysiology Office Note   Date:  04/24/2016   ID:  Stephen Morrison, Stephen Morrison 1946/02/06, MRN [id number]  PCP:  Annye Asa, MD  Primary Electrophysiologist:  Will Meredith Leeds, MD    Chief Complaint  Patient presents with  . Follow-up    PAF     History of Present Illness: Stephen Morrison is a 70 y.o. male who presents today for electrophysiology evaluation.   He has a history of HTn, HLD, and AF.  He presented to the ER on 3/25 with one hour of palpitations.  At that time, the palpitations woke him from sleep.  The patient was cardioverted out of AF in the ER.  He was started on Eliquis at that time and told to follow up in clinic but was switched to Xarelto in the interim.    Today, he denies symptoms of palpitations, chest pain, shortness of breath, orthopnea, PND, lower extremity edema, claudication, dizziness, presyncope, syncope, bleeding, or neurologic sequela. The patient is tolerating medications without difficulties. He does say that he is having difficulties with fatigue. He says that his energy is quite a bit less, and he is unable to do all of the things that he normally does. He says that his wife is noted that he is much less active than he had been in the past.     Past Medical History:  Diagnosis Date  . BPH (benign prostatic hyperplasia)   . Diverticulosis of colon   . Foley catheter in place   . GERD (gastroesophageal reflux disease)   . Gout    STABLE PER PT ON 05-12-2014  . Hyperlipidemia   . Hypertension   . Hypothyroidism   . PSA elevation   . Tick bite 02/25/2016  . Urinary retention    Past Surgical History:  Procedure Laterality Date  . COLONOSCOPY  01-31-2007  . EXCISION BENIGN CYST POSTERIOR NECK  2010  . INSERTION OF SUPRAPUBIC CATHETER N/A 05/17/2014   Procedure: INSERTION OF SUPRAPUBIC CATHETER;  Surgeon: Bernestine Amass, MD;  Location: [facility];  Service: Urology;  Laterality: N/A;  . ORIF RIGHT WRIST FX  2009  .  TRANSURETHRAL RESECTION OF PROSTATE N/A 05/17/2014   Procedure: TRANSURETHRAL RESECTION OF THE PROSTATE WITH GYRUS INSTRUMENTS;  Surgeon: Bernestine Amass, MD;  Location: [facility];  Service: Urology;  Laterality: N/A;     Current Outpatient Prescriptions  Medication Sig Dispense Refill  . acetaminophen (TYLENOL) 500 MG tablet Take 1,000 mg by mouth every 6 (six) hours as needed for fever.    Marland Kitchen amLODipine (NORVASC) 5 MG tablet TAKE 1 TABLET DAILY 90 tablet 0  . carvedilol (COREG) 12.5 MG tablet Take 1 tablet (12.5 mg total) by mouth 2 (two) times daily. 180 tablet 3  . cetirizine (ZYRTEC) 10 MG tablet Take 10 mg by mouth as needed for allergies.     . flecainide (TAMBOCOR) 50 MG tablet Take 1.5 tablets (75 mg total) by mouth 2 (two) times daily. 270 tablet 3  . gabapentin (NEURONTIN) 300 MG capsule Take 1 capsule (300 mg total) by mouth at bedtime. 30 capsule 1  . hydrALAZINE (APRESOLINE) 10 MG tablet Take 1 tablet (10 mg total) by mouth 2 (two) times daily. 180 tablet 1  . levothyroxine (SYNTHROID, LEVOTHROID) 50 MCG tablet Take 1 tablet (50 mcg total) by mouth daily. 90 tablet 1  . omeprazole (PRILOSEC) 20 MG capsule Take 20 mg by mouth daily as needed (acid reflux).    Marland Kitchen  rivaroxaban (XARELTO) 20 MG TABS tablet Take 1 tablet (20 mg total) by mouth daily with supper. 90 tablet 3  . valsartan-hydrochlorothiazide (DIOVAN-HCT) 320-12.5 MG tablet TAKE 1 TABLET DAILY 90 tablet 1  . VYTORIN 10-40 MG tablet TAKE 1 TABLET MONDAY, WEDNESDAY, AND FRIDAY 90 tablet 1   No current facility-administered medications for this visit.     Allergies:   Amoxicillin   Social History:  The patient  reports that he quit smoking about 42 years ago. His smoking use included Cigarettes. He has a 10.00 pack-year smoking history. He quit smokeless tobacco use about 41 years ago. His smokeless tobacco use included Snuff and Chew. He reports that he drinks alcohol. He reports that he does not use drugs.    Family History:  The patient's family history includes Alcohol abuse in his father; Dementia in his father; Hyperlipidemia in his mother; Hypertension in his mother.    ROS:  Please see the history of present illness.   Otherwise, review of systems is positive for fatigue, back pain, joint swelling, easy bruising.   All other systems are reviewed and negative.    PHYSICAL EXAM: VS:  BP 136/80   Pulse (!) 49   Ht 5' 10.5" (1.791 m)   Wt 223 lb 12.8 oz (101.5 kg)   BMI 31.66 kg/m  , BMI Body mass index is 31.66 kg/m. GEN: Well nourished, well developed, in no acute distress  HEENT: normal  Neck: no JVD, carotid bruits, or masses Cardiac: bradycardic, regular; no murmurs, rubs, or gallops,no edema  Respiratory:  clear to auscultation bilaterally, normal work of breathing GI: soft, nontender, nondistended, + BS MS: no deformity or atrophy  Skin: warm and dry Neuro:  Strength and sensation are intact Psych: euthymic mood, full affect  EKG:  EKG is ordered today. The ekg ordered today shows sinus rhythm, rate 49, LVH by voltage, rate 49  Recent Labs: 10/03/2015: ALT 57 11/05/2015: BUN 13; Creatinine, Ser 1.10; Hemoglobin 17.7; Magnesium 2.3; Platelets 319; Potassium 3.3; Sodium 140; TSH 5.593    Lipid Panel     Component Value Date/Time   CHOL 174 10/03/2015 0851   TRIG 180.0 (H) 10/03/2015 0851   HDL 35.20 (L) 10/03/2015 0851   CHOLHDL 5 10/03/2015 0851   VLDL 36.0 10/03/2015 0851   LDLCALC 103 (H) 10/03/2015 0851   LDLDIRECT 123.3 06/01/2013 1237     Wt Readings from Last 3 Encounters:  04/24/16 223 lb 12.8 oz (101.5 kg)  02/25/16 223 lb 8 oz (101.4 kg)  02/22/16 223 lb (101.2 kg)      Other studies Reviewed: Additional studies/ records that were reviewed today include:  TTE 12/09/15 - Left ventricle: The cavity size was normal. Wall thickness was  increased in a pattern of mild LVH. Systolic function was normal.  The estimated ejection fraction was in the  range of 60% to 65%.  Wall motion was normal; there were no regional wall motion  abnormalities.    ASSESSMENT AND PLAN:  1.  Atrial fibrillation: Patient has risk factors of hypertension and age.  On rivaroxaban. On coreg and Flecainide.  Currently he is having significant fatigue issues which could possibly be due to his beta blocker and a slow heart rate. Due to that, we'll plan for decreasing his Coreg to 6.25 mg twice a day. If he continues to have significant fatigue on that dose, we'll consider switching him to diltiazem.  This patients CHA2DS2-VASc Score and unadjusted Ischemic Stroke Rate (% per year) is  equal to 2.2 % stroke rate/year from a score of 2  Above score calculated as 1 point each if present [CHF, HTN, DM, Vascular=MI/PAD/Aortic Plaque, Age if 65-74, or Male] Above score calculated as 2 points each if present [Age > 75, or Stroke/TIA/TE]   2. Hypertension: Well-controlled today. We are decreasing the dose of his Coreg. Have asked him to check his blood pressures for the next 2 weeks.  Current medicines are reviewed at length with the patient today.   The patient does not have concerns regarding his medicines.  The following changes were made today:  Decrease Coreg  Labs/ tests ordered today include:  No orders of the defined types were placed in this encounter.    Disposition:   FU with Will Camnitz 6 months  Signed, Will Meredith Leeds, MD  04/24/2016 1:55 PM     [address] [address] [phone] (office) [phone] (fax)

## 2016-04-24 NOTE — Patient Instructions (Addendum)
Medication Instructions:  Your physician has recommended you make the following change in your medication:  1) DECREASE Carvedilol to 6.25 mg twice daily  Labwork: None ordered  Testing/Procedures: None ordered  Follow-Up: Please follow up with Dr. Curt Bears in January 2018.  You will receive a letter to remind you to make an appointment as that time nears.  If you need a refill on your cardiac medications before your next appointment, please call your pharmacy.  Thank you for choosing [facility]!!   Trinidad Curet, RN [phone]  Any Other Special Instructions Will Be Listed Below (If Applicable).

## 2016-04-27 ENCOUNTER — Ambulatory Visit: Payer: Medicare Other | Admitting: Cardiology

## 2016-05-25 ENCOUNTER — Telehealth: Payer: Self-pay | Admitting: *Deleted

## 2016-05-25 NOTE — Telephone Encounter (Signed)
Called patient to follow up on BPs after decreasing Carvedilol at OV w/ Camnitz on 9/12. Pt reports "feeling a whole hell of a lot better than before". Reports the following BPs in the last week:  134/70  140/73  131/70  133/67  150/84  120/62   124/64  152/84  Pt advised to continue to monitor BPs.  He is agreeable to calling office if SBP #s average 140/above.  He thanks me for following up with him.

## 2016-06-02 ENCOUNTER — Other Ambulatory Visit: Payer: Self-pay | Admitting: Family Medicine

## 2016-06-12 ENCOUNTER — Encounter: Payer: Self-pay | Admitting: Family Medicine

## 2016-07-02 ENCOUNTER — Ambulatory Visit (INDEPENDENT_AMBULATORY_CARE_PROVIDER_SITE_OTHER): Payer: Medicare Other | Admitting: Family Medicine

## 2016-07-02 ENCOUNTER — Encounter: Payer: Self-pay | Admitting: Family Medicine

## 2016-07-02 VITALS — BP 136/86 | HR 56 | Temp 98.1°F | Resp 16 | Ht 71.0 in | Wt 221.2 lb

## 2016-07-02 DIAGNOSIS — E038 Other specified hypothyroidism: Secondary | ICD-10-CM

## 2016-07-02 DIAGNOSIS — G8929 Other chronic pain: Secondary | ICD-10-CM

## 2016-07-02 DIAGNOSIS — Z0000 Encounter for general adult medical examination without abnormal findings: Secondary | ICD-10-CM | POA: Diagnosis not present

## 2016-07-02 DIAGNOSIS — R338 Other retention of urine: Secondary | ICD-10-CM

## 2016-07-02 DIAGNOSIS — I10 Essential (primary) hypertension: Secondary | ICD-10-CM

## 2016-07-02 DIAGNOSIS — Z23 Encounter for immunization: Secondary | ICD-10-CM | POA: Diagnosis not present

## 2016-07-02 DIAGNOSIS — Z1159 Encounter for screening for other viral diseases: Secondary | ICD-10-CM

## 2016-07-02 DIAGNOSIS — M545 Low back pain: Secondary | ICD-10-CM

## 2016-07-02 DIAGNOSIS — N529 Male erectile dysfunction, unspecified: Secondary | ICD-10-CM

## 2016-07-02 DIAGNOSIS — E785 Hyperlipidemia, unspecified: Secondary | ICD-10-CM

## 2016-07-02 DIAGNOSIS — N401 Enlarged prostate with lower urinary tract symptoms: Secondary | ICD-10-CM | POA: Diagnosis not present

## 2016-07-02 LAB — LIPID PANEL
CHOLESTEROL: 175 mg/dL (ref <200)
HDL: 35 mg/dL — ABNORMAL LOW (ref >40)
LDL Cholesterol: 101 mg/dL — ABNORMAL HIGH (ref <100)
Total CHOL/HDL Ratio: 5 Ratio — ABNORMAL HIGH (ref <5.0)
Triglycerides: 193 mg/dL — ABNORMAL HIGH (ref <150)
VLDL: 39 mg/dL — AB (ref <30)

## 2016-07-02 LAB — BASIC METABOLIC PANEL
BUN: 15 mg/dL (ref 7–25)
CHLORIDE: 99 mmol/L (ref 98–110)
CO2: 31 mmol/L (ref 20–31)
Calcium: 9.5 mg/dL (ref 8.6–10.3)
Creat: 1.03 mg/dL (ref 0.70–1.18)
GLUCOSE: 99 mg/dL (ref 65–99)
POTASSIUM: 3.6 mmol/L (ref 3.5–5.3)
SODIUM: 139 mmol/L (ref 135–146)

## 2016-07-02 LAB — TSH: TSH: 2.84 mIU/L (ref 0.40–4.50)

## 2016-07-02 LAB — CBC WITH DIFFERENTIAL/PLATELET
BASOS ABS: 0 {cells}/uL (ref 0–200)
Basophils Relative: 0 %
EOS ABS: 122 {cells}/uL (ref 15–500)
EOS PCT: 2 %
HEMATOCRIT: 49.4 % (ref 38.5–50.0)
HEMOGLOBIN: 17.5 g/dL — AB (ref 13.2–17.1)
LYMPHS ABS: 2318 {cells}/uL (ref 850–3900)
Lymphocytes Relative: 38 %
MCH: 30.6 pg (ref 27.0–33.0)
MCHC: 35.4 g/dL (ref 32.0–36.0)
MCV: 86.4 fL (ref 80.0–100.0)
MPV: 9.5 fL (ref 7.5–12.5)
Monocytes Absolute: 793 cells/uL (ref 200–950)
Monocytes Relative: 13 %
NEUTROS PCT: 47 %
Neutro Abs: 2867 cells/uL (ref 1500–7800)
Platelets: 332 10*3/uL (ref 140–400)
RBC: 5.72 MIL/uL (ref 4.20–5.80)
RDW: 13.3 % (ref 11.0–15.0)
WBC: 6.1 10*3/uL (ref 3.8–10.8)

## 2016-07-02 LAB — HEPATIC FUNCTION PANEL
ALBUMIN: 4.4 g/dL (ref 3.6–5.1)
ALK PHOS: 44 U/L (ref 40–115)
ALT: 31 U/L (ref 9–46)
AST: 29 U/L (ref 10–35)
BILIRUBIN INDIRECT: 0.8 mg/dL (ref 0.2–1.2)
BILIRUBIN TOTAL: 1 mg/dL (ref 0.2–1.2)
Bilirubin, Direct: 0.2 mg/dL (ref <=0.2)
Total Protein: 7.5 g/dL (ref 6.1–8.1)

## 2016-07-02 MED ORDER — SILDENAFIL CITRATE 20 MG PO TABS: 40.0000 mg | ORAL_TABLET | ORAL | 3 refills | Status: DC | PRN

## 2016-07-02 NOTE — Patient Instructions (Signed)
Follow up in 6 months to recheck BP and thyroid We'll notify you of your lab results and make any changes if needed Continue to work on healthy diet and regular exercise- you look good! You are up to date on colonoscopy until next year- yay!!! With your flu and pneumonia shots today, you are now up to date- yay! Call with any questions or concerns Happy Holidays!!!

## 2016-07-02 NOTE — Progress Notes (Signed)
   Subjective:    Patient ID: [name], male    DOB: 28-Oct-1945, 70 y.o.   MRN: [id number]  HPI Here today for CPE.  Risk Factors: HTN- chronic problem, on Valsartan HCTZ, Hydralazine, Coreg, Amlodipine.  Denies CP, SOB, HAs, visual changes, edema. Hypothyroid- chronic problem, on Levothyroxine 33mcg daily.  Denies fatigue, changes to skin/hair/nails. ED- chronic problem, pt asking for refill of Viagra LBP- sxs started ~2-3 yrs ago but only occur when lying down.  Able to walk, play golf, sit in recliner w/o difficulty.  Pain improves when he gets out of bed or brings legs up to his chest.  Pt bought a new mattress w/o relief of sxs. Physical Activity: active but no formal exercise Fall Risk: low risk Depression: denies Hearing: decreased to conversational and whispered voice at 6 ft ADL's: independent Cognitive: normal linear thought process, memory and attention intact Home Safety: safe at home, lives w/ wife Height, Weight, BMI, Visual Acuity: see vitals, vision corrected to 20/20 w/ glasses Counseling: UTD on urology, colonoscopy (due next year), Due for pneumovax and flu shot today. Care team reviewed and updated w/ pt Labs Ordered: See A&P Care Plan: See A&P    Review of Systems Patient reports no vision/hearing changes, anorexia, fever ,adenopathy, persistant/recurrent hoarseness, swallowing issues, chest pain, palpitations, edema, persistant/recurrent cough, hemoptysis, dyspnea (rest,exertional, paroxysmal nocturnal), gastrointestinal  bleeding (melena, rectal bleeding), abdominal pain, excessive heart burn, GU symptoms (dysuria, hematuria, voiding/incontinence issues) syncope, focal weakness, memory loss, numbness & tingling, skin/hair/nail changes, depression, anxiety, abnormal bruising/bleeding.    Objective:   Physical Exam General Appearance:    Alert, cooperative, no distress, appears stated age  Head:    Normocephalic, without obvious abnormality, atraumatic  Eyes:     PERRL, conjunctiva/corneas clear, EOM's intact, fundi    benign, both eyes       Ears:    Normal TM's and external ear canals, both ears  Nose:   Nares normal, septum midline, mucosa normal, no drainage   or sinus tenderness  Throat:   Lips, mucosa, and tongue normal; teeth and gums normal  Neck:   Supple, symmetrical, trachea midline, no adenopathy;       thyroid:  No enlargement/tenderness/nodules  Back:     Symmetric, no curvature, ROM normal, no CVA tenderness  Lungs:     Clear to auscultation bilaterally, respirations unlabored  Chest wall:    No tenderness or deformity  Heart:    Regular rate and rhythm, S1 and S2 normal, no murmur, rub   or gallop  Abdomen:     Soft, non-tender, bowel sounds active all four quadrants,    no masses, no organomegaly  Genitalia:    Deferred to Urology  Rectal:    Extremities:   Extremities normal, atraumatic, no cyanosis or edema  Pulses:   2+ and symmetric all extremities  Skin:   Skin color, texture, turgor normal, no rashes or lesions  Lymph nodes:   Cervical, supraclavicular, and axillary nodes normal  Neurologic:   CNII-XII intact. Normal strength, sensation and reflexes      throughout          Assessment & Plan:

## 2016-07-02 NOTE — Progress Notes (Signed)
Pre visit review using our clinic review tool, if applicable. No additional management support is needed unless otherwise documented below in the visit note. 

## 2016-07-03 LAB — HEPATITIS C ANTIBODY: HCV AB: NEGATIVE

## 2016-07-03 LAB — PSA: PSA: 1.7 ng/mL (ref <=4.0)

## 2016-07-04 DIAGNOSIS — M545 Low back pain: Secondary | ICD-10-CM | POA: Diagnosis not present

## 2016-07-04 DIAGNOSIS — M5416 Radiculopathy, lumbar region: Secondary | ICD-10-CM | POA: Diagnosis not present

## 2016-07-09 ENCOUNTER — Telehealth: Payer: Self-pay | Admitting: General Practice

## 2016-07-09 ENCOUNTER — Telehealth: Payer: Self-pay | Admitting: *Deleted

## 2016-07-09 ENCOUNTER — Encounter: Payer: Self-pay | Admitting: Cardiology

## 2016-07-09 NOTE — Telephone Encounter (Signed)
(  MyChart note from pt: 07/09/16 9:37 AM  Dr Curt Bears I have had rectal bleeding on several occasions since I started on Xeralto. This occurs not only when when I have a bowel movement, but also randomly and it is, at times, a lot. This doesn't happen daily but it's very troubling. My question is, is there another solution/treatment for my Afib. I'd like to stop this medicine but don't want to have another episode. My last dose was Wednesday, Nov 22nd. I also had blood in my urine on Thursday..... Marrian Salvage )   Called pt secondary to MyChart message he sent today reporting bleeding issues. States he has had 5 episodes of bleeding since starting Xarelto back in April of this year (started secondary to PAF/CHADs score). He experienced 2 episodes over Thanksgiving - one episode he spent 45 min to an hour in the bathroom trying to get/wait for bleeding to stop. Reports previous bld in urine also. States he has not had any further bleeding since last Thursday after stopping medication. Hx of mild hemorrhoids, but reports none currently and bleeding is not related to current issue. Pt advised to remain off blood thinner for now and will discuss with Dr. Curt Bears. Pt aware I will call him with recommendations once discussed with physician.  He is agreeable to plan.

## 2016-07-09 NOTE — Telephone Encounter (Signed)
PA began for sildenafil. Started on cover my meds.

## 2016-07-09 NOTE — Telephone Encounter (Signed)
PA for sildenafil was denied, will call pt and inform/

## 2016-07-10 NOTE — Telephone Encounter (Signed)
Called patient and LMOVM to return call.     

## 2016-07-10 NOTE — Telephone Encounter (Signed)
Advised pt on voicemail that he would have to pay for the prescription out of pocket, insurance has refused to pay for the medication and did not give [place] any alternative medications.

## 2016-07-12 NOTE — Telephone Encounter (Signed)
Patient has not tried Eliquis.  Will switch to Eliquis to see if different outcome.   I will check into insurance coverage tomorrow morning and call pt back. Discussed with pt that if he experiences bleeding issues on Eliquis then we would refer him for GI consult.  Pt is agreeable to plan.

## 2016-07-13 MED ORDER — APIXABAN 5 MG PO TABS: 5.0000 mg | ORAL_TABLET | Freq: Two times a day (BID) | ORAL | 0 refills | Status: DC

## 2016-07-13 NOTE — Addendum Note (Signed)
Addended by: Stanton Kidney on: 07/13/2016 11:17 AM   Modules accepted: Orders

## 2016-07-13 NOTE — Telephone Encounter (Signed)
Left 30 day free card at front desk for pt to pick up on Monday (office is closing early today).  He wants to wait until 12/7 to start trial of Eliquis as he has a steroid injection on Wednesday. Will follow up with patient in next couple of weeks to see how he is doing on new medication. He understands to stop med immediately if he experiences bleeding again.  He will call office if this occurs.

## 2016-07-16 ENCOUNTER — Telehealth: Payer: Self-pay

## 2016-07-16 NOTE — Telephone Encounter (Signed)
Prior auth for Eliquis 5 mg obtained from [facility]. [id number] and is valid through 07/13/2018

## 2016-07-17 NOTE — Assessment & Plan Note (Signed)
Chronic problem.  Currently asymptomatic.  Check labs.  No anticipated med changes.  Will follow. 

## 2016-07-17 NOTE — Assessment & Plan Note (Signed)
Chronic problem.  Currently asymptomatic.  Check labs.  Adjust meds prn  

## 2016-07-17 NOTE — Assessment & Plan Note (Signed)
Chronic problem.  Pt is asking for prescription for generic sildenafil.  Prescription given along w/ instructions on appropriate use.

## 2016-07-17 NOTE — Assessment & Plan Note (Signed)
Pt's PE WNL.  UTD on colonoscopy (due next year), urology.  Flu shot and Pneumovax given today.  Written screening schedule updated and given to pt.  Check labs.  Anticipatory guidance provided.

## 2016-07-17 NOTE — Assessment & Plan Note (Signed)
Chronic problem.  Following w/ urology.  Pt is asking for repeat PSA today rather than going back to urology.  Labs ordered.

## 2016-07-17 NOTE — Assessment & Plan Note (Signed)
Chronic problem.  Attempting to control w/ healthy diet and regular exercise.  Check labs.  Start meds prn. 

## 2016-07-18 DIAGNOSIS — M5416 Radiculopathy, lumbar region: Secondary | ICD-10-CM | POA: Diagnosis not present

## 2016-07-26 NOTE — Telephone Encounter (Signed)
Follow up after switching to Eliquis. Pt reports no issues since starting Eliquis last week. Informed that I would follow up in week/two to confirm no issues have occurred. He thanks me for following up with him.

## 2016-08-08 ENCOUNTER — Encounter: Payer: Self-pay | Admitting: Cardiology

## 2016-08-08 ENCOUNTER — Encounter: Payer: Self-pay | Admitting: *Deleted

## 2016-08-09 ENCOUNTER — Other Ambulatory Visit: Payer: Self-pay | Admitting: *Deleted

## 2016-08-09 MED ORDER — APIXABAN 5 MG PO TABS: 5.0000 mg | ORAL_TABLET | Freq: Two times a day (BID) | ORAL | 3 refills | Status: DC

## 2016-08-20 ENCOUNTER — Other Ambulatory Visit: Payer: Self-pay | Admitting: Family Medicine

## 2016-08-27 ENCOUNTER — Ambulatory Visit: Payer: Medicare Other | Admitting: Cardiology

## 2016-08-29 ENCOUNTER — Ambulatory Visit: Payer: Medicare Other | Admitting: Cardiology

## 2016-09-02 ENCOUNTER — Other Ambulatory Visit: Payer: Self-pay | Admitting: Family Medicine

## 2016-09-04 NOTE — Telephone Encounter (Signed)
Patient reports no issues since switching to Eliquis. He appreciates my following up on this matter. He will follow up in a few weeks in the office.

## 2016-09-17 ENCOUNTER — Other Ambulatory Visit: Payer: Self-pay | Admitting: Family Medicine

## 2016-09-19 ENCOUNTER — Ambulatory Visit (INDEPENDENT_AMBULATORY_CARE_PROVIDER_SITE_OTHER): Payer: Medicare Other | Admitting: Cardiology

## 2016-09-19 ENCOUNTER — Encounter: Payer: Self-pay | Admitting: Cardiology

## 2016-09-19 VITALS — BP 158/90 | HR 52 | Ht 71.0 in | Wt 227.2 lb

## 2016-09-19 DIAGNOSIS — I480 Paroxysmal atrial fibrillation: Secondary | ICD-10-CM

## 2016-09-19 MED ORDER — CARVEDILOL 12.5 MG PO TABS: 6.2500 mg | ORAL_TABLET | Freq: Two times a day (BID) | ORAL | 3 refills | Status: DC

## 2016-09-19 MED ORDER — AMLODIPINE BESYLATE 10 MG PO TABS: 10.0000 mg | ORAL_TABLET | Freq: Every day | ORAL | 3 refills | Status: DC

## 2016-09-19 NOTE — Progress Notes (Addendum)
Electrophysiology Office Note   Date:  09/19/2016   ID:  Lear, Martinetti Jul 07, 1946, MRN [id number]  PCP:  Annye Asa, MD  Primary Electrophysiologist:  Shaquella Stamant Meredith Leeds, MD    Chief Complaint  Patient presents with  . Follow-up    PAF     History of Present Illness: Stephen Morrison is a 71 y.o. male who presents today for electrophysiology evaluation.   He has a history of HTn, HLD, and AF.  He presented to the ER on 3/25 with one hour of palpitations.  At that time, the palpitations woke him from sleep.  The patient was cardioverted out of AF in the ER.  He was started on Eliquis at that time and told to follow up in clinic but was switched to Xarelto in the interim.    Today, he denies symptoms of palpitations, chest pain, shortness of breath, orthopnea, PND, lower extremity edema, claudication, dizziness, presyncope, syncope, bleeding, or neurologic sequela. The patient is tolerating medications without difficulties. He is continuing to have episodes of fatigue. His carvedilol was decreased to 12.5 mg at the last visit, and he noted a significant improvement in his level of fatigue, but the fatigue has continued. Otherwise he is doing well without major complaint.    Past Medical History:  Diagnosis Date  . BPH (benign prostatic hyperplasia)   . Diverticulosis of colon   . Foley catheter in place   . GERD (gastroesophageal reflux disease)   . Gout    STABLE PER PT ON 05-12-2014  . Hyperlipidemia   . Hypertension   . Hypothyroidism   . PSA elevation   . Tick bite 02/25/2016  . Urinary retention    Past Surgical History:  Procedure Laterality Date  . COLONOSCOPY  01-31-2007  . EXCISION BENIGN CYST POSTERIOR NECK  2010  . INSERTION OF SUPRAPUBIC CATHETER N/A 05/17/2014   Procedure: INSERTION OF SUPRAPUBIC CATHETER;  Surgeon: Bernestine Amass, MD;  Location: [facility];  Service: Urology;  Laterality: N/A;  . ORIF RIGHT WRIST FX  2009  . TRANSURETHRAL  RESECTION OF PROSTATE N/A 05/17/2014   Procedure: TRANSURETHRAL RESECTION OF THE PROSTATE WITH GYRUS INSTRUMENTS;  Surgeon: Bernestine Amass, MD;  Location: [facility];  Service: Urology;  Laterality: N/A;     Current Outpatient Prescriptions  Medication Sig Dispense Refill  . acetaminophen (TYLENOL) 500 MG tablet Take 1,000 mg by mouth every 6 (six) hours as needed for fever.    Marland Kitchen apixaban (ELIQUIS) 5 MG TABS tablet Take 1 tablet (5 mg total) by mouth 2 (two) times daily. 180 tablet 3  . carvedilol (COREG) 12.5 MG tablet Take 0.5 tablets (6.25 mg total) by mouth 2 (two) times daily. 180 tablet 3  . cetirizine (ZYRTEC) 10 MG tablet Take 10 mg by mouth as needed for allergies.     . flecainide (TAMBOCOR) 50 MG tablet Take 1.5 tablets (75 mg total) by mouth 2 (two) times daily. 270 tablet 3  . gabapentin (NEURONTIN) 300 MG capsule Take 1 capsule (300 mg total) by mouth at bedtime. 30 capsule 1  . hydrALAZINE (APRESOLINE) 10 MG tablet TAKE 1 TABLET TWICE A DAY 180 tablet 1  . levothyroxine (SYNTHROID, LEVOTHROID) 25 MCG tablet TAKE 1 TABLET DAILY BEFORE BREAKFAST 90 tablet 1  . omeprazole (PRILOSEC) 20 MG capsule Take 20 mg by mouth daily as needed (acid reflux).    . valsartan-hydrochlorothiazide (DIOVAN-HCT) 320-12.5 MG tablet TAKE 1 TABLET DAILY 90 tablet 1  .  VYTORIN 10-40 MG tablet TAKE 1 TABLET MONDAY, WEDNESDAY, AND FRIDAY 90 tablet 1  . amLODipine (NORVASC) 10 MG tablet Take 1 tablet (10 mg total) by mouth daily. 180 tablet 3   No current facility-administered medications for this visit.     Allergies:   Amoxicillin   Social History:  The patient  reports that he quit smoking about 43 years ago. His smoking use included Cigarettes. He has a 10.00 pack-year smoking history. He quit smokeless tobacco use about 42 years ago. His smokeless tobacco use included Snuff and Chew. He reports that he drinks alcohol. He reports that he does not use drugs.   Family History:  The  patient's family history includes Alcohol abuse in his father; Dementia in his father; Hyperlipidemia in his mother; Hypertension in his mother.    ROS:  Please see the history of present illness.   Otherwise, review of systems is positive for easy bruising.   All other systems are reviewed and negative.    PHYSICAL EXAM: VS:  BP (!) 158/90   Pulse (!) 52   Ht 5\' 11"  (1.803 m)   Wt 227 lb 3.2 oz (103.1 kg)   SpO2 95%   BMI 31.69 kg/m  , BMI Body mass index is 31.69 kg/m. GEN: Well nourished, well developed, in no acute distress  HEENT: normal  Neck: no JVD, carotid bruits, or masses Cardiac: bradycardic, regular; no murmurs, rubs, or gallops,no edema  Respiratory:  clear to auscultation bilaterally, normal work of breathing GI: soft, nontender, nondistended, + BS MS: no deformity or atrophy  Skin: warm and dry Neuro:  Strength and sensation are intact Psych: euthymic mood, full affect  EKG:  EKG is ordered today. The ekg ordered today shows sinus rhythm, rate 52, LVH by voltage, 1 degree AV block  Recent Labs: 11/05/2015: Magnesium 2.3 07/02/2016: ALT 31; BUN 15; Creat 1.03; Hemoglobin 17.5; Platelets 332; Potassium 3.6; Sodium 139; TSH 2.84    Lipid Panel     Component Value Date/Time   CHOL 175 07/02/2016 1609   TRIG 193 (H) 07/02/2016 1609   HDL 35 (L) 07/02/2016 1609   CHOLHDL 5.0 (H) 07/02/2016 1609   VLDL 39 (H) 07/02/2016 1609   LDLCALC 101 (H) 07/02/2016 1609   LDLDIRECT 123.3 06/01/2013 1237     Wt Readings from Last 3 Encounters:  09/19/16 227 lb 3.2 oz (103.1 kg)  07/02/16 221 lb 4 oz (100.4 kg)  04/24/16 223 lb 12.8 oz (101.5 kg)      Other studies Reviewed: Additional studies/ records that were reviewed today include:  TTE 12/09/15 - Left ventricle: The cavity size was normal. Wall thickness was  increased in a pattern of mild LVH. Systolic function was normal.  The estimated ejection fraction was in the range of 60% to 65%.  Wall motion was  normal; there were no regional wall motion  abnormalities.    ASSESSMENT AND PLAN:  1.  Atrial fibrillation: Patient has risk factors of hypertension and age.  On rivaroxaban. On coreg and Flecainide.  He is currently having episodes of fatigue and taking 12.5 mg of carvedilol. Jemya Depierro decrease his carvedilol to 6.25 mg twice a day. She did continue to have episodes of fatigue, Cecily Lawhorne plan for a switch to diltiazem.  This patients CHA2DS2-VASc Score and unadjusted Ischemic Stroke Rate (% per year) is equal to 2.2 % stroke rate/year from a score of 2  Above score calculated as 1 point each if present [CHF, HTN, DM, Vascular=MI/PAD/Aortic Plaque,  Age if 73-74, or Male] Above score calculated as 2 points each if present [Age > 75, or Stroke/TIA/TE]   2. Hypertension: Elevated today, but apparently well-controlled at home. I have discussed with him decreasing his carvedilol and the fact that we'll need to increase his amlodipine. He Nada Godley continue to check his blood pressure home and Vail Basista call so that remains elevated.  Current medicines are reviewed at length with the patient today.   The patient does not have concerns regarding his medicines.  The following changes were made today:  Decrease Coreg  Labs/ tests ordered today include:  Orders Placed This Encounter  Procedures  . EKG 12-Lead     Disposition:   FU with Tracker Mance 6 months  Signed, Felicia Both Meredith Leeds, MD  09/19/2016 10:45 AM     [facility] [address] [place] [address] [phone] (office) [phone] (fax)

## 2016-09-19 NOTE — Patient Instructions (Signed)
Medication Instructions:    Your physician has recommended you make the following change in your medication: 1) TAKE Carvedilol 6.25 mg twice daily 2) INCREASE Amlodipine to 10 mg once daily   --- If you need a refill on your cardiac medications before your next appointment, please call your pharmacy. ---  Labwork:  None ordered  Testing/Procedures:  None ordered  Follow-Up:  Your physician wants you to follow-up in: 6 months with Dr. Curt Bears.  You will receive a reminder letter in the mail two months in advance. If you don't receive a letter, please call our office to schedule the follow-up appointment.   Thank you for choosing [facility]!!   Trinidad Curet, RN [phone]

## 2016-09-25 ENCOUNTER — Encounter: Payer: Self-pay | Admitting: Cardiology

## 2016-09-26 ENCOUNTER — Encounter: Payer: Self-pay | Admitting: Cardiology

## 2016-09-26 MED ORDER — CARVEDILOL 3.125 MG PO TABS: 3.1250 mg | ORAL_TABLET | Freq: Two times a day (BID) | ORAL | 3 refills | Status: DC

## 2016-09-26 NOTE — Telephone Encounter (Signed)
Informed pt to take 3.125mg  BID of Carvedilol, per Dr. Curt Bears. He is agreeable to plan.

## 2016-09-26 NOTE — Telephone Encounter (Signed)
Called pt to discuss. He was seen on 2/07 and was instructed to decrease Coreg to 6.25.  Pt tells me that he was taking 6.25mg  already.  I explained that per Dr. Chipper Oman note, he was under the impression pt still taking 12.5 mg BID.  Informed pt to take 3.125mg  today and I will call him once discussed further w/ Dr. Curt Bears.  Pt is agreeable to plan.

## 2016-10-12 DIAGNOSIS — H40013 Open angle with borderline findings, low risk, bilateral: Secondary | ICD-10-CM | POA: Diagnosis not present

## 2016-10-18 ENCOUNTER — Encounter: Payer: Self-pay | Admitting: Family Medicine

## 2016-10-18 ENCOUNTER — Telehealth: Payer: Self-pay | Admitting: *Deleted

## 2016-10-18 ENCOUNTER — Ambulatory Visit (INDEPENDENT_AMBULATORY_CARE_PROVIDER_SITE_OTHER): Payer: Medicare Other | Admitting: Family Medicine

## 2016-10-18 VITALS — BP 106/68 | HR 66 | Temp 98.9°F | Resp 16 | Ht 71.0 in | Wt 225.0 lb

## 2016-10-18 DIAGNOSIS — M25562 Pain in left knee: Secondary | ICD-10-CM

## 2016-10-18 DIAGNOSIS — M545 Low back pain: Secondary | ICD-10-CM

## 2016-10-18 DIAGNOSIS — G8929 Other chronic pain: Secondary | ICD-10-CM

## 2016-10-18 MED ORDER — DICLOFENAC SODIUM 1 % TD GEL: 4.0000 g | Freq: Four times a day (QID) | TRANSDERMAL | 3 refills | Status: DC

## 2016-10-18 NOTE — Progress Notes (Signed)
   Subjective:    Patient ID: Stephen Morrison, male    DOB: 1945-11-01, 71 y.o.   MRN: [id number]  HPI Knee pain- pt was golfing 3 weeks ago and then later that afternoon developed 'shooting pains' anteriorly.  Has seen [facility] 1-2 yrs ago for R knee pain.  Pt has never had L knee pain previously.  R knee has improved, L knee continues to hurt.  Pain will improve w/ Ibuprofen but returns quickly.  Full ROM of knees bilaterally but pain w/ weight bearing.  Pt tried to play golf last week and pain worsened.  LBP- pt reports this is ongoing issue.  Unable to lie flat.  Has to get up after 3-4 hrs and sleep in recliner.  No pain or difficulty with activity but each night, unable to lie down.   Review of Systems For ROS see HPI     Objective:   Physical Exam  Constitutional: He is oriented to person, place, and time. He appears well-developed and well-nourished. No distress.  HENT:  Head: Normocephalic and atraumatic.  Cardiovascular: Intact distal pulses.   Musculoskeletal: He exhibits no edema.  Full ROM of L knee, no swelling or deformity.  Mild TTP over anteromedial L knee but pain is markedly worse w/ weight bearing Good flexion and extension of back- no radiculopathy  Neurological: He is alert and oriented to person, place, and time.  Skin: Skin is warm and dry. No rash noted. No erythema.  Psychiatric: He has a normal mood and affect. His behavior is normal. Thought content normal.  Vitals reviewed.         Assessment & Plan:  L knee pain- new.  Start Voltaren gel.  Refer to ortho given that knee pain occurred 3 weeks ago w/ activity and has not improved.  Reviewed supportive care and red flags that should prompt return.  Pt expressed understanding and is in agreement w/ plan.   LBP- chronic problem.  Pt got a new sleep number mattress a few years ago but this has not helped his pain.  He has to sleep in recliner nightly and not able to lie down more than 3 hrs w/o pain.   Refer for physical therapy.  If no improvement in PT, will refer to ortho.  Pt expressed understanding and is in agreement w/ plan.

## 2016-10-18 NOTE — Progress Notes (Signed)
Pre visit review using our clinic review tool, if applicable. No additional management support is needed unless otherwise documented below in the visit note. 

## 2016-10-18 NOTE — Telephone Encounter (Signed)
Received PA request through Cover [employer] Med[employer].  PA was completed and medication approved through 10/18/17.    Information has been faxed back to pharmacy.

## 2016-10-18 NOTE — Patient Instructions (Signed)
Follow up as needed/scheduled Start the Diclofenac gel on the knee as directed We'll call you with your Ortho and PT appts Call with any questions or concerns Happy [place]!!!

## 2016-10-22 ENCOUNTER — Encounter: Payer: Self-pay | Admitting: Family Medicine

## 2016-10-23 DIAGNOSIS — M2560 Stiffness of unspecified joint, not elsewhere classified: Secondary | ICD-10-CM | POA: Diagnosis not present

## 2016-10-23 DIAGNOSIS — I10 Essential (primary) hypertension: Secondary | ICD-10-CM | POA: Diagnosis not present

## 2016-10-23 DIAGNOSIS — M6250 Muscle wasting and atrophy, not elsewhere classified, unspecified site: Secondary | ICD-10-CM | POA: Diagnosis not present

## 2016-10-23 DIAGNOSIS — M545 Low back pain: Secondary | ICD-10-CM | POA: Diagnosis not present

## 2016-10-23 MED ORDER — COLCHICINE 0.6 MG PO TABS: 0.6000 mg | ORAL_TABLET | Freq: Two times a day (BID) | ORAL | 0 refills | Status: DC | PRN

## 2016-10-31 ENCOUNTER — Encounter: Payer: Self-pay | Admitting: Family Medicine

## 2016-10-31 DIAGNOSIS — M6250 Muscle wasting and atrophy, not elsewhere classified, unspecified site: Secondary | ICD-10-CM | POA: Diagnosis not present

## 2016-10-31 DIAGNOSIS — I10 Essential (primary) hypertension: Secondary | ICD-10-CM | POA: Diagnosis not present

## 2016-10-31 DIAGNOSIS — M2560 Stiffness of unspecified joint, not elsewhere classified: Secondary | ICD-10-CM | POA: Diagnosis not present

## 2016-10-31 DIAGNOSIS — M545 Low back pain: Secondary | ICD-10-CM | POA: Diagnosis not present

## 2016-11-02 DIAGNOSIS — M2560 Stiffness of unspecified joint, not elsewhere classified: Secondary | ICD-10-CM | POA: Diagnosis not present

## 2016-11-02 DIAGNOSIS — M545 Low back pain: Secondary | ICD-10-CM | POA: Diagnosis not present

## 2016-11-02 DIAGNOSIS — M6250 Muscle wasting and atrophy, not elsewhere classified, unspecified site: Secondary | ICD-10-CM | POA: Diagnosis not present

## 2016-11-02 DIAGNOSIS — I10 Essential (primary) hypertension: Secondary | ICD-10-CM | POA: Diagnosis not present

## 2016-11-05 DIAGNOSIS — I10 Essential (primary) hypertension: Secondary | ICD-10-CM | POA: Diagnosis not present

## 2016-11-05 DIAGNOSIS — M545 Low back pain: Secondary | ICD-10-CM | POA: Diagnosis not present

## 2016-11-05 DIAGNOSIS — M6250 Muscle wasting and atrophy, not elsewhere classified, unspecified site: Secondary | ICD-10-CM | POA: Diagnosis not present

## 2016-11-05 DIAGNOSIS — M2560 Stiffness of unspecified joint, not elsewhere classified: Secondary | ICD-10-CM | POA: Diagnosis not present

## 2016-11-07 DIAGNOSIS — M545 Low back pain: Secondary | ICD-10-CM | POA: Diagnosis not present

## 2016-11-07 DIAGNOSIS — M6250 Muscle wasting and atrophy, not elsewhere classified, unspecified site: Secondary | ICD-10-CM | POA: Diagnosis not present

## 2016-11-07 DIAGNOSIS — M2560 Stiffness of unspecified joint, not elsewhere classified: Secondary | ICD-10-CM | POA: Diagnosis not present

## 2016-11-07 DIAGNOSIS — I10 Essential (primary) hypertension: Secondary | ICD-10-CM | POA: Diagnosis not present

## 2016-11-12 ENCOUNTER — Encounter: Payer: Self-pay | Admitting: Cardiology

## 2016-11-12 ENCOUNTER — Other Ambulatory Visit: Payer: Self-pay | Admitting: Family Medicine

## 2016-11-12 DIAGNOSIS — I10 Essential (primary) hypertension: Secondary | ICD-10-CM | POA: Diagnosis not present

## 2016-11-12 DIAGNOSIS — M2560 Stiffness of unspecified joint, not elsewhere classified: Secondary | ICD-10-CM | POA: Diagnosis not present

## 2016-11-12 DIAGNOSIS — M545 Low back pain: Secondary | ICD-10-CM | POA: Diagnosis not present

## 2016-11-12 DIAGNOSIS — M6250 Muscle wasting and atrophy, not elsewhere classified, unspecified site: Secondary | ICD-10-CM | POA: Diagnosis not present

## 2016-11-12 MED ORDER — CARVEDILOL 12.5 MG PO TABS: 6.2500 mg | ORAL_TABLET | Freq: Two times a day (BID) | ORAL | 6 refills | Status: DC

## 2016-11-12 MED ORDER — AMLODIPINE BESYLATE 5 MG PO TABS
5.0000 mg | ORAL_TABLET | Freq: Every day | ORAL | 3 refills | Status: DC
Start: 2016-11-12 — End: 2017-07-03

## 2016-11-12 MED ORDER — CARVEDILOL 3.125 MG PO TABS: 3.1250 mg | ORAL_TABLET | Freq: Two times a day (BID) | ORAL | 6 refills | Status: DC

## 2016-11-12 NOTE — Telephone Encounter (Signed)
Per Dr. Curt Bears, advised patient to decrease Amlodipine back to 5mg  daily and start Cardura 1mg  daily.  Pt would prefer to monitor and call if BP goes above 140s - does not wish to start another medication at this time. Advised to continue to monitor BPs and call office if SBP increases above 140s.  Patient verbalized understanding and agreeable to plan.

## 2016-11-15 DIAGNOSIS — M2560 Stiffness of unspecified joint, not elsewhere classified: Secondary | ICD-10-CM | POA: Diagnosis not present

## 2016-11-15 DIAGNOSIS — I10 Essential (primary) hypertension: Secondary | ICD-10-CM | POA: Diagnosis not present

## 2016-11-15 DIAGNOSIS — M6250 Muscle wasting and atrophy, not elsewhere classified, unspecified site: Secondary | ICD-10-CM | POA: Diagnosis not present

## 2016-11-15 DIAGNOSIS — M545 Low back pain: Secondary | ICD-10-CM | POA: Diagnosis not present

## 2016-11-21 DIAGNOSIS — M6250 Muscle wasting and atrophy, not elsewhere classified, unspecified site: Secondary | ICD-10-CM | POA: Diagnosis not present

## 2016-11-21 DIAGNOSIS — M545 Low back pain: Secondary | ICD-10-CM | POA: Diagnosis not present

## 2016-11-21 DIAGNOSIS — I10 Essential (primary) hypertension: Secondary | ICD-10-CM | POA: Diagnosis not present

## 2016-11-21 DIAGNOSIS — M2560 Stiffness of unspecified joint, not elsewhere classified: Secondary | ICD-10-CM | POA: Diagnosis not present

## 2016-11-27 ENCOUNTER — Encounter: Payer: Self-pay | Admitting: Gastroenterology

## 2016-12-26 ENCOUNTER — Ambulatory Visit: Payer: Medicare Other | Admitting: Family Medicine

## 2016-12-28 ENCOUNTER — Ambulatory Visit (INDEPENDENT_AMBULATORY_CARE_PROVIDER_SITE_OTHER): Payer: Medicare Other | Admitting: Family Medicine

## 2016-12-28 ENCOUNTER — Encounter: Payer: Self-pay | Admitting: Family Medicine

## 2016-12-28 ENCOUNTER — Other Ambulatory Visit: Payer: Self-pay | Admitting: Family Medicine

## 2016-12-28 VITALS — BP 110/70 | HR 70 | Temp 98.1°F | Resp 17 | Ht 71.0 in | Wt 223.4 lb

## 2016-12-28 DIAGNOSIS — E038 Other specified hypothyroidism: Secondary | ICD-10-CM | POA: Diagnosis not present

## 2016-12-28 DIAGNOSIS — R600 Localized edema: Secondary | ICD-10-CM | POA: Diagnosis not present

## 2016-12-28 DIAGNOSIS — I10 Essential (primary) hypertension: Secondary | ICD-10-CM

## 2016-12-28 DIAGNOSIS — E785 Hyperlipidemia, unspecified: Secondary | ICD-10-CM

## 2016-12-28 LAB — BASIC METABOLIC PANEL
BUN: 10 mg/dL (ref 6–23)
CO2: 30 mEq/L (ref 19–32)
Calcium: 9.5 mg/dL (ref 8.4–10.5)
Chloride: 100 mEq/L (ref 96–112)
Creatinine, Ser: 1.03 mg/dL (ref 0.40–1.50)
GFR: 75.32 mL/min (ref >60.00)
GLUCOSE: 97 mg/dL (ref 70–99)
POTASSIUM: 4 meq/L (ref 3.5–5.1)
SODIUM: 139 meq/L (ref 135–145)

## 2016-12-28 LAB — HEPATIC FUNCTION PANEL
ALBUMIN: 4.5 g/dL (ref 3.5–5.2)
ALT: 36 U/L (ref 0–53)
AST: 29 U/L (ref 0–37)
Alkaline Phosphatase: 51 U/L (ref 39–117)
Bilirubin, Direct: 0.1 mg/dL (ref 0.0–0.3)
TOTAL PROTEIN: 7.4 g/dL (ref 6.0–8.3)
Total Bilirubin: 0.8 mg/dL (ref 0.2–1.2)

## 2016-12-28 LAB — CBC WITH DIFFERENTIAL/PLATELET
BASOS PCT: 0.5 % (ref 0.0–3.0)
Basophils Absolute: 0 10*3/uL (ref 0.0–0.1)
EOS PCT: 1.4 % (ref 0.0–5.0)
Eosinophils Absolute: 0.1 10*3/uL (ref 0.0–0.7)
HEMATOCRIT: 50.2 % (ref 39.0–52.0)
Hemoglobin: 17 g/dL (ref 13.0–17.0)
LYMPHS PCT: 28.1 % (ref 12.0–46.0)
Lymphs Abs: 1.8 10*3/uL (ref 0.7–4.0)
MCHC: 33.8 g/dL (ref 30.0–36.0)
MCV: 87.9 fl (ref 78.0–100.0)
MONOS PCT: 10.4 % (ref 3.0–12.0)
Monocytes Absolute: 0.7 10*3/uL (ref 0.1–1.0)
NEUTROS ABS: 3.8 10*3/uL (ref 1.4–7.7)
Neutrophils Relative %: 59.6 % (ref 43.0–77.0)
PLATELETS: 358 10*3/uL (ref 150.0–400.0)
RBC: 5.71 Mil/uL (ref 4.22–5.81)
RDW: 13.2 % (ref 11.5–15.5)
WBC: 6.4 10*3/uL (ref 4.0–10.5)

## 2016-12-28 LAB — LIPID PANEL
CHOLESTEROL: 170 mg/dL (ref 0–200)
HDL: 33 mg/dL — AB (ref >39.00)
LDL Cholesterol: 98 mg/dL (ref 0–99)
NonHDL: 136.75
TRIGLYCERIDES: 192 mg/dL — AB (ref 0.0–149.0)
Total CHOL/HDL Ratio: 5
VLDL: 38.4 mg/dL (ref 0.0–40.0)

## 2016-12-28 LAB — TSH: TSH: 4.87 u[IU]/mL — ABNORMAL HIGH (ref 0.35–4.50)

## 2016-12-28 MED ORDER — FUROSEMIDE 20 MG PO TABS: 20.0000 mg | ORAL_TABLET | Freq: Every day | ORAL | 3 refills | Status: DC

## 2016-12-28 NOTE — Assessment & Plan Note (Signed)
New.  Pt reports swelling has improved somewhat since decreasing Amlodipine to 5mg  daily.  Swelling today is 1-2+ pitting edema until mid-shin.  Will add Lasix prn.  Check labs to r/o metabolic causes.  Will follow.

## 2016-12-28 NOTE — Progress Notes (Signed)
Pre visit review using our clinic review tool, if applicable. No additional management support is needed unless otherwise documented below in the visit note. 

## 2016-12-28 NOTE — Assessment & Plan Note (Signed)
Chronic problem.  Asymptomatic.  Check labs.  Adjust meds prn  

## 2016-12-28 NOTE — Progress Notes (Signed)
   Subjective:    Patient ID: [name], male    DOB: 1946/05/10, 71 y.o.   MRN: [id number]  HPI HTN- chronic problem, on Amlodipine 5mg , Coreg 3.125 BID, Hydralazine 10mg  BID, Valsartan HCTZ daily w/ good control.  Denies CP, SOB, HAs, visual changes.  + edema.  Exercising regularly  Hypothyroid- chronic problem, on Levothyroxine 38mcg daily.  Denies fatigue.  Changes to skin/hair/nails.  Edema- new.  Pt has discussed this w/ Cards and Amlodipine was decreased to 5mg  daily.  Pt admits to limited water intake. Limited salt in diet.    Hyperlipidemia- chronic problem, on Vytorin M/W/F.  Denies abd pain, N/V.  Review of Systems For ROS see HPI     Objective:   Physical Exam  Constitutional: He is oriented to person, place, and time. He appears well-developed and well-nourished. No distress.  HENT:  Head: Normocephalic and atraumatic.  Eyes: Conjunctivae and EOM are normal. Pupils are equal, round, and reactive to light.  Neck: Normal range of motion. Neck supple. No thyromegaly present.  Cardiovascular: Normal rate, regular rhythm, normal heart sounds and intact distal pulses.   No murmur heard. Pulmonary/Chest: Effort normal and breath sounds normal. No respiratory distress.  Abdominal: Soft. Bowel sounds are normal. He exhibits no distension.  Musculoskeletal: He exhibits edema (1-2+ pitting edema to mid-shin).  Lymphadenopathy:    He has no cervical adenopathy.  Neurological: He is alert and oriented to person, place, and time. No cranial nerve deficit.  Skin: Skin is warm and dry.  Psychiatric: He has a normal mood and affect. His behavior is normal.  Vitals reviewed.         Assessment & Plan:

## 2016-12-28 NOTE — Assessment & Plan Note (Signed)
Chronic problem.  Tolerating Vytorin w/o difficulty.  Stressed need for healthy diet and regular exercise.  Check labs.  Adjust meds prn

## 2016-12-28 NOTE — Assessment & Plan Note (Signed)
Chronic problem.  Adequate control.  Asymptomatic w/ exception of swelling.  Check labs.  No anticipated med changes.

## 2016-12-28 NOTE — Patient Instructions (Signed)
Schedule your annual Wellness Visit in 6 months w/ Maudie Mercury and a follow up w/ me at the same time [facility] notify you of your lab results and make any changes if needed Keep up the good work!  You look great! Add the Furosemide once daily as needed for swelling Call with any questions or concerns Have a great summer!!!

## 2016-12-31 ENCOUNTER — Other Ambulatory Visit: Payer: Self-pay | Admitting: General Practice

## 2016-12-31 DIAGNOSIS — E038 Other specified hypothyroidism: Secondary | ICD-10-CM

## 2016-12-31 MED ORDER — LEVOTHYROXINE SODIUM 50 MCG PO TABS: 50.0000 ug | ORAL_TABLET | Freq: Every day | ORAL | 3 refills | Status: DC

## 2017-01-31 ENCOUNTER — Other Ambulatory Visit (INDEPENDENT_AMBULATORY_CARE_PROVIDER_SITE_OTHER): Payer: Medicare Other

## 2017-01-31 DIAGNOSIS — E038 Other specified hypothyroidism: Secondary | ICD-10-CM

## 2017-01-31 LAB — TSH: TSH: 3.67 u[IU]/mL (ref 0.35–4.50)

## 2017-03-04 ENCOUNTER — Other Ambulatory Visit: Payer: Self-pay | Admitting: Family Medicine

## 2017-03-05 ENCOUNTER — Other Ambulatory Visit: Payer: Self-pay | Admitting: Family Medicine

## 2017-03-23 ENCOUNTER — Other Ambulatory Visit: Payer: Self-pay | Admitting: Cardiology

## 2017-04-19 ENCOUNTER — Telehealth: Payer: Self-pay | Admitting: *Deleted

## 2017-04-19 MED ORDER — ATORVASTATIN CALCIUM 40 MG PO TABS: 40.0000 mg | ORAL_TABLET | Freq: Every day | ORAL | 3 refills | Status: DC

## 2017-04-19 NOTE — Telephone Encounter (Signed)
While there is not a concern given his low dose of Amlodipine, we can switch the Vytorin to Lipitor 40mg  nightly

## 2017-04-19 NOTE — Telephone Encounter (Signed)
Received safety and health consideration form from [facility] advising of adverse drug interaction b/t Amlodipine and Vytorin. Advised pt to discuss, with PCP, about switching to a different cholesterol medication. Will forward to his PCP to address w/ him. Asked pt to call PCP office by middle of next week if he has not heard from them. Patient verbalized understanding and agreeable to plan.

## 2017-04-19 NOTE — Telephone Encounter (Signed)
Called pt and left a detailed message that new medication was filled to local pharmacy.

## 2017-04-19 NOTE — Addendum Note (Signed)
Addended by: Davis Gourd on: 04/19/2017 04:35 PM   Modules accepted: Orders

## 2017-05-03 ENCOUNTER — Other Ambulatory Visit: Payer: Self-pay | Admitting: Family Medicine

## 2017-05-06 ENCOUNTER — Other Ambulatory Visit: Payer: Self-pay | Admitting: Family Medicine

## 2017-05-20 ENCOUNTER — Encounter: Payer: Self-pay | Admitting: Gastroenterology

## 2017-05-29 ENCOUNTER — Telehealth: Payer: Self-pay

## 2017-05-29 ENCOUNTER — Ambulatory Visit (INDEPENDENT_AMBULATORY_CARE_PROVIDER_SITE_OTHER): Payer: Medicare Other | Admitting: Gastroenterology

## 2017-05-29 ENCOUNTER — Encounter: Payer: Self-pay | Admitting: Gastroenterology

## 2017-05-29 VITALS — BP 142/90 | HR 56 | Ht 71.0 in | Wt 221.1 lb

## 2017-05-29 DIAGNOSIS — Z1211 Encounter for screening for malignant neoplasm of colon: Secondary | ICD-10-CM | POA: Insufficient documentation

## 2017-05-29 DIAGNOSIS — Z7901 Long term (current) use of anticoagulants: Secondary | ICD-10-CM | POA: Diagnosis not present

## 2017-05-29 MED ORDER — NA SULFATE-K SULFATE-MG SULF 17.5-3.13-1.6 GM/177ML PO SOLN: 1.0000 | Freq: Once | ORAL | 0 refills | Status: AC

## 2017-05-29 NOTE — Progress Notes (Signed)
Reviewed and agree with management plan.  Malcolm T. Stark, MD FACG 

## 2017-05-29 NOTE — Patient Instructions (Signed)
If you are age 73 or older, your body mass index should be between 23-30. Your Body mass index is 30.84 kg/m. If this is out of the aforementioned range listed, please consider follow up with your Primary Care Provider.  If you are age 34 or younger, your body mass index should be between 19-25. Your Body mass index is 30.84 kg/m. If this is out of the aformentioned range listed, please consider follow up with your Primary Care Provider.   We have sent the following medications to your pharmacy for you to pick up at your convenience:  [place] have been scheduled for a colonoscopy. Please follow written instructions given to you at your visit today.  Please pick up your prep supplies at the pharmacy within the next 1-3 days. If you use inhalers (even only as needed), please bring them with you on the day of your procedure. Your physician has requested that you go to www.startemmi.com and enter the access code given to you at your visit today. This web site gives a general overview about your procedure. However, you should still follow specific instructions given to you by our office regarding your preparation for the procedure.  Thank you.

## 2017-05-29 NOTE — Telephone Encounter (Signed)
Patient with diagnosis of Atrial fibrillation on Eliquis for anticoagulation.    Procedure: endoscopy Date of procedure: 06/13/17  CHADS2 score of 1 (HTN);  CHADS2-VASc score of  2 (HTN, AGE)  CrCl 90.4 Platelet count 358  Per office protocol, patient can hold Eliquis for 2 days prior to procedure.    Patient should restart Eliquis on the evening of procedure or day after, at discretion of procedure MD

## 2017-05-29 NOTE — Telephone Encounter (Signed)
RE: Stephen Morrison DOB: [birth year] MRN: [id number]   Dear Cyril Mourning,    We have scheduled the above patient for an endoscopic procedure. Our records show that he is on anticoagulation therapy.   Please advise as to how long the patient may come off his therapy of Eliquis prior to the procedure, which is scheduled for 06/13/17.  Please fax back/ or route the completed form to [place] [facility] at [phone].   Sincerely,   Caryl Pina, LPN

## 2017-05-29 NOTE — Progress Notes (Signed)
05/29/2017 Stephen Morrison [id number] 11-Sep-1945   HISTORY OF PRESENT ILLNESS:  This is a pleasant 71 year old male who is known to Dr. Fuller Plan for colonoscopy in 2008.  He only had diverticulosis and internal hemorrhoids at that time.  He is here today to discuss/schedule another colonoscopy.  He is on Eliquis for atrial fibrillation that has been very stable.  Cardiologist is Dr. Curt Bears.  Patient denies any GI complaints including rectal bleeding, abdominal pain, constipation, diarrhea.  No family history of colon cancer.   Past Medical History:  Diagnosis Date  . BPH (benign prostatic hyperplasia)   . Diverticulosis of colon   . Foley catheter in place   . GERD (gastroesophageal reflux disease)   . Gout    STABLE PER PT ON 05-12-2014  . Hyperlipidemia   . Hypertension   . Hypothyroidism   . PSA elevation   . Tick bite 02/25/2016  . Urinary retention    Past Surgical History:  Procedure Laterality Date  . COLONOSCOPY  01-31-2007  . EXCISION BENIGN CYST POSTERIOR NECK  2010  . INSERTION OF SUPRAPUBIC CATHETER N/A 05/17/2014   Procedure: INSERTION OF SUPRAPUBIC CATHETER;  Surgeon: Bernestine Amass, MD;  Location: [facility];  Service: Urology;  Laterality: N/A;  . ORIF RIGHT WRIST FX  2009  . TRANSURETHRAL RESECTION OF PROSTATE N/A 05/17/2014   Procedure: TRANSURETHRAL RESECTION OF THE PROSTATE WITH GYRUS INSTRUMENTS;  Surgeon: Bernestine Amass, MD;  Location: [facility];  Service: Urology;  Laterality: N/A;    reports that he quit smoking about 43 years ago. His smoking use included Cigarettes. He has a 10.00 pack-year smoking history. He quit smokeless tobacco use about 43 years ago. His smokeless tobacco use included Snuff and Chew. He reports that he drinks alcohol. He reports that he does not use drugs. family history includes Alcohol abuse in his father; Dementia in his father; Hyperlipidemia in his mother; Hypertension in his mother. Allergies    Allergen Reactions  . Amoxicillin Swelling and Rash    Tongue swells Has patient had a PCN reaction causing immediate rash, facial/tongue/throat swelling, SOB or lightheadedness with hypotension: Has patient had a PCN reaction causing severe rash involving mucus membranes or skin necrosis:  Has patient had a PCN reaction that required hospitalization Has patient had a PCN reaction occurring within the last 10 years: If all of the above answers are "NO", then may proceed with Cephalosporin use.       Outpatient Encounter Prescriptions as of 05/29/2017  Medication Sig  . amLODipine (NORVASC) 5 MG tablet Take 1 tablet (5 mg total) by mouth daily.  Marland Kitchen apixaban (ELIQUIS) 5 MG TABS tablet Take 1 tablet (5 mg total) by mouth 2 (two) times daily.  Marland Kitchen atorvastatin (LIPITOR) 40 MG tablet Take 1 tablet (40 mg total) by mouth daily.  . carvedilol (COREG) 3.125 MG tablet Take 1 tablet (3.125 mg total) by mouth 2 (two) times daily.  . cetirizine (ZYRTEC) 10 MG tablet Take 10 mg by mouth as needed for allergies.   Marland Kitchen colchicine 0.6 MG tablet Take 1 tablet (0.6 mg total) by mouth 2 (two) times daily as needed (pain). Reported on 10/03/2015  . flecainide (TAMBOCOR) 50 MG tablet Take 1.5 tablets (75 mg total) by mouth 2 (two) times daily.  . furosemide (LASIX) 20 MG tablet TAKE 1 TABLET BY MOUTH EVERY DAY  . gabapentin (NEURONTIN) 300 MG capsule TAKE 1 CAPSULE AT BEDTIME  . hydrALAZINE (  APRESOLINE) 10 MG tablet TAKE 1 TABLET TWICE A DAY  . levothyroxine (SYNTHROID, LEVOTHROID) 50 MCG tablet Take 1 tablet (50 mcg total) by mouth daily.  Marland Kitchen omeprazole (PRILOSEC) 20 MG capsule Take 20 mg by mouth daily as needed (acid reflux).  . valsartan-hydrochlorothiazide (DIOVAN-HCT) 320-12.5 MG tablet TAKE 1 TABLET DAILY  . [DISCONTINUED] diclofenac sodium (VOLTAREN) 1 % GEL Apply 4 g topically 4 (four) times daily.   No facility-administered encounter medications on file as of 05/29/2017.      REVIEW OF SYSTEMS  :  All other systems reviewed and negative except where noted in the History of Present Illness.   PHYSICAL EXAM: BP (!) 142/90   Pulse (!) 56   Ht 5\' 11"  (1.803 m)   Wt 221 lb 1.6 oz (100.3 kg)   BMI 30.84 kg/m  General: Well developed white male in no acute distress Head: Normocephalic and atraumatic Eyes:  Sclerae anicteric, conjunctiva pink. Ears: Normal auditory acuity  Lungs: Clear throughout to auscultation; no increased WOB. Heart: Regular rate and rhythm Abdomen: Soft, non-distended.  Normal bowel sounds.  Non-tender. Rectal:  Will be done at the time of colonoscopy. Musculoskeletal: Symmetrical with no gross deformities  Skin: No lesions on visible extremities Extremities: No edema  Neurological: Alert oriented x 4, grossly non-focal Psychological:  Alert and cooperative. Normal mood and affect  ASSESSMENT AND PLAN: *Screening colonoscopy:  Will schedule with Dr. Fuller Plan.   *Chronic anticoagulation with Eliquis for atrial fibrillation:  Will hold Eliquis for 2 days prior to endoscopic procedures - will instruct when and how to resume after procedure. Benefits and risks of procedure explained including risks of bleeding, perforation, infection, missed lesions, reactions to medications and possible need for hospitalization and surgery for complications. Additional rare but real risk of stroke or other vascular clotting events off of Eliquis also explained and need to seek urgent help if any signs of these problems occur. Will communicate by phone or EMR with patient's prescribing provider, Dr. Curt Bears, to confirm that holding Eliquis is reasonable in this case.    CC:  [name], MD

## 2017-05-29 NOTE — Telephone Encounter (Signed)
Pt informed and understood to hold Eliquis 2 days prior to his procedure.

## 2017-06-05 ENCOUNTER — Other Ambulatory Visit: Payer: Self-pay | Admitting: Family Medicine

## 2017-06-07 ENCOUNTER — Other Ambulatory Visit: Payer: Self-pay | Admitting: Family Medicine

## 2017-06-09 ENCOUNTER — Encounter: Payer: Self-pay | Admitting: Family Medicine

## 2017-06-10 ENCOUNTER — Other Ambulatory Visit: Payer: Self-pay | Admitting: Emergency Medicine

## 2017-06-10 MED ORDER — ATORVASTATIN CALCIUM 40 MG PO TABS: 40.0000 mg | ORAL_TABLET | Freq: Every day | ORAL | 1 refills | Status: DC

## 2017-06-10 MED ORDER — LEVOTHYROXINE SODIUM 50 MCG PO TABS: 50.0000 ug | ORAL_TABLET | Freq: Every day | ORAL | 1 refills | Status: DC

## 2017-06-10 MED ORDER — FUROSEMIDE 20 MG PO TABS: 20.0000 mg | ORAL_TABLET | Freq: Every day | ORAL | 1 refills | Status: DC

## 2017-06-13 ENCOUNTER — Ambulatory Visit (AMBULATORY_SURGERY_CENTER): Payer: Medicare Other | Admitting: Gastroenterology

## 2017-06-13 ENCOUNTER — Encounter: Payer: Self-pay | Admitting: Gastroenterology

## 2017-06-13 VITALS — BP 126/79 | HR 55 | Temp 98.2°F | Resp 13 | Ht 71.0 in | Wt 221.0 lb

## 2017-06-13 DIAGNOSIS — Z1212 Encounter for screening for malignant neoplasm of rectum: Secondary | ICD-10-CM

## 2017-06-13 DIAGNOSIS — Z1211 Encounter for screening for malignant neoplasm of colon: Secondary | ICD-10-CM | POA: Diagnosis not present

## 2017-06-13 DIAGNOSIS — I4891 Unspecified atrial fibrillation: Secondary | ICD-10-CM | POA: Diagnosis not present

## 2017-06-13 DIAGNOSIS — I10 Essential (primary) hypertension: Secondary | ICD-10-CM | POA: Diagnosis not present

## 2017-06-13 MED ORDER — SODIUM CHLORIDE 0.9 % IV SOLN: 500.0000 mL | INTRAVENOUS | Status: DC

## 2017-06-13 NOTE — Op Note (Signed)
[facility] Patient Name: Stephen Morrison Procedure Date: 06/13/2017 10:38 AM MRN: [id number] Endoscopist: Ladene Artist , MD Age: 71 Referring MD:  Date of Birth: Aug 15, 1945 Gender: Male Account #: [id number] Procedure:                Colonoscopy Indications:              Screening for colorectal malignant neoplasm Medicines:                Monitored Anesthesia Care Procedure:                Pre-Anesthesia Assessment:                           - Prior to the procedure, a History and Physical                            was performed, and patient medications and                            allergies were reviewed. The patient's tolerance of                            previous anesthesia was also reviewed. The risks                            and benefits of the procedure and the sedation                            options and risks were discussed with the patient.                            All questions were answered, and informed consent                            was obtained. Prior Anticoagulants: The patient has                            taken Eliquis (apixaban), last dose was 2 days                            prior to procedure. ASA Grade Assessment: III - A                            patient with severe systemic disease. After                            reviewing the risks and benefits, the patient was                            deemed in satisfactory condition to undergo the                            procedure.  After obtaining informed consent, the colonoscope                            was passed under direct vision. Throughout the                            procedure, the patient's blood pressure, pulse, and                            oxygen saturations were monitored continuously. The                            Model PCF-[id number]) scope was introduced                            through the anus and advanced to the the cecum,                        identified by appendiceal orifice and ileocecal                            valve. The ileocecal valve, appendiceal orifice,                            and rectum were photographed. The quality of the                            bowel preparation was adequate. The colonoscopy was                            performed without difficulty. The patient tolerated                            the procedure well. Scope In: 10:53:52 AM Scope Out: 11:11:21 AM Scope Withdrawal Time: 0 hours 11 minutes 52 seconds  Total Procedure Duration: 0 hours 17 minutes 29 seconds  Findings:                 The perianal and digital rectal examinations were                            normal.                           Multiple medium-mouthed diverticula were found in                            the left colon. There was narrowing of the colon in                            association with the diverticular opening. There                            was evidence of diverticular spasm. There was  evidence of an impacted diverticulum. There was no                            evidence of diverticular bleeding.                           Internal hemorrhoids were found during                            retroflexion. The hemorrhoids were medium-sized and                            Grade I (internal hemorrhoids that do not prolapse).                           The exam was otherwise without abnormality on                            direct and retroflexion views. Complications:            No immediate complications. Estimated blood loss:                            None. Estimated Blood Loss:     Estimated blood loss: none. Impression:               - Severe diverticulosis in the left colon. There                            was narrowing of the colon in association with the                            diverticular opening. There was evidence of                            diverticular  spasm. There was evidence of an                            impacted diverticulum. There was no evidence of                            diverticular bleeding.                           - Internal hemorrhoids.                           - The examination was otherwise normal on direct                            and retroflexion views.                           - No specimens collected. Recommendation:           - Resume Eliquis (apixaban) today at prior dose.  Refer to managing physician for further adjustment                            of therapy.                           - Patient has a contact number available for                            emergencies. The signs and symptoms of potential                            delayed complications were discussed with the                            patient. Return to normal activities tomorrow.                            Written discharge instructions were provided to the                            patient.                           - Continue present medications.                           - High fiber diet indefinitely.                           - No repeat colonoscopy due to age and the absence                            of colonic polyps. Ladene Artist, MD 06/13/2017 11:17:34 AM This report has been signed electronically.

## 2017-06-13 NOTE — Progress Notes (Signed)
Report given to PACU, vss 

## 2017-06-13 NOTE — Patient Instructions (Signed)
Impression/Recommendations:  Diverticulosis handout given to patient. Hemorrhoid handout given to patient.  Resume Eliquis (apixaban) at prior dose today.  Continue present medications.  High fiber diet indefinitely.  No repeat colonoscopy due to age and absence of colonic polyps.  YOU HAD AN ENDOSCOPIC PROCEDURE TODAY AT [facility]:   Refer to the procedure report that was given to you for any specific questions about what was found during the examination.  If the procedure report does not answer your questions, please call your gastroenterologist to clarify.  If you requested that your care partner not be given the details of your procedure findings, then the procedure report has been included in a sealed envelope for you to review at your convenience later.  YOU SHOULD EXPECT: Some feelings of bloating in the abdomen. Passage of more gas than usual.  Walking can help get rid of the air that was put into your GI tract during the procedure and reduce the bloating. If you had a lower endoscopy (such as a colonoscopy or flexible sigmoidoscopy) you may notice spotting of blood in your stool or on the toilet paper. If you underwent a bowel prep for your procedure, you may not have a normal bowel movement for a few days.  Please Note:  You might notice some irritation and congestion in your nose or some drainage.  This is from the oxygen used during your procedure.  There is no need for concern and it should clear up in a day or so.  SYMPTOMS TO REPORT IMMEDIATELY:   Following lower endoscopy (colonoscopy or flexible sigmoidoscopy):  Excessive amounts of blood in the stool  Significant tenderness or worsening of abdominal pains  Swelling of the abdomen that is new, acute  Fever of 100F or higher   For urgent or emergent issues, a gastroenterologist can be reached at any hour by calling [phone].   DIET:  We do recommend a small meal at first, but then you may  proceed to your regular diet.  Drink plenty of fluids but you should avoid alcoholic beverages for 24 hours.  ACTIVITY:  You should plan to take it easy for the rest of today and you should NOT DRIVE or use heavy machinery until tomorrow (because of the sedation medicines used during the test).    FOLLOW UP: Our staff will call the number listed on your records the next business day following your procedure to check on you and address any questions or concerns that you may have regarding the information given to you following your procedure. If we do not reach you, we will leave a message.  However, if you are feeling well and you are not experiencing any problems, there is no need to return our call.  We will assume that you have returned to your regular daily activities without incident.  If any biopsies were taken you will be contacted by phone or by letter within the next 1-3 weeks.  Please call us at [phone] if you have not heard about the biopsies in 3 weeks.    SIGNATURES/CONFIDENTIALITY: You and/or your care partner have signed paperwork which will be entered into your electronic medical record.  These signatures attest to the fact that that the information above on your After Visit Summary has been reviewed and is understood.  Full responsibility of the confidentiality of this discharge information lies with you and/or your care-partner.

## 2017-06-14 ENCOUNTER — Telehealth: Payer: Self-pay | Admitting: *Deleted

## 2017-06-14 NOTE — Telephone Encounter (Signed)
  Follow up Call-  Call back number 06/13/2017  Post procedure Call Back phone  # [phone]  Permission to leave phone message Yes  Some recent data might be hidden     Patient questions:  Do you have a fever, pain , or abdominal swelling? No. Pain Score  0 *  Have you tolerated food without any problems? Yes.    Have you been able to return to your normal activities? Yes.    Do you have any questions about your discharge instructions: Diet   No. Medications  No. Follow up visit  No.  Do you have questions or concerns about your Care? No.  Actions: * If pain score is 4 or above: No action needed, pain <4.

## 2017-06-17 ENCOUNTER — Ambulatory Visit (INDEPENDENT_AMBULATORY_CARE_PROVIDER_SITE_OTHER): Payer: Medicare Other

## 2017-06-17 DIAGNOSIS — Z23 Encounter for immunization: Secondary | ICD-10-CM | POA: Diagnosis not present

## 2017-07-02 ENCOUNTER — Telehealth: Payer: Self-pay

## 2017-07-02 NOTE — Progress Notes (Signed)
Subjective:   Stephen Morrison is a 71 y.o. male who presents for Medicare Annual/Subsequent preventive examination.  Review of Systems:  No ROS.  Medicare Wellness Visit. Additional risk factors are reflected in the social history.  Cardiac Risk Factors include: advanced age (>19men, >39 women);dyslipidemia;hypertension;male gender;obesity (BMI >30kg/m2)   Sleep patterns: Sleeps about 7 hours.  Home Safety/Smoke Alarms: Feels safe in home. Smoke alarms in place.  Living environment; residence and Firearm Safety: Lives with wife in 1 story home.  Seat Belt Safety/Bike Helmet: Wears seat belt.    Male:   CCS-Colonoscopy 06/13/2017, normal. No recall.      PSA-  Lab Results  Component Value Date   PSA 1.7 07/02/2016   PSA 2.38 12/02/2013   PSA 2.80 07/02/2012       Objective:    Vitals: BP (!) 143/84   Pulse 61   Temp 98 F (36.7 C) (Oral)   Resp 16   Ht 5\' 11"  (1.803 m)   Wt 221 lb 2 oz (100.3 kg)   SpO2 98%   BMI 30.84 kg/m   Body mass index is 30.84 kg/m.  Tobacco Social History   Tobacco Use  Smoking Status Former Smoker  . Packs/day: 1.00  . Years: 10.00  . Pack years: 10.00  . Types: Cigarettes  . Last attempt to quit: 08/13/1973  . Years since quitting: 43.9  Smokeless Tobacco Former Systems developer  . Types: Snuff, Chew  . Quit date: 05/12/1974     Counseling given: Yes   Past Medical History:  Diagnosis Date  . Allergy   . Arthritis   . Atrial fibrillation ([place])    one time incident, on medication now with no further problems  . BPH (benign prostatic hyperplasia)   . Diverticulosis of colon   . GERD (gastroesophageal reflux disease)   . Gout    STABLE PER PT ON 05-12-2014  . Hyperlipidemia   . Hypertension   . Hypothyroidism   . PSA elevation   . Tick bite 02/25/2016  . Urinary retention    Past Surgical History:  Procedure Laterality Date  . COLONOSCOPY  01-31-2007  . EXCISION BENIGN CYST POSTERIOR NECK  2010  . INSERTION OF SUPRAPUBIC CATHETER  N/A 05/17/2014   Procedure: INSERTION OF SUPRAPUBIC CATHETER;  Surgeon: Bernestine Amass, MD;  Location[facility];  Service: Urology;  Laterality: N/A;  . ORIF RIGHT WRIST FX  2009  . TRANSURETHRAL RESECTION OF PROSTATE N/A 05/17/2014   Procedure: TRANSURETHRAL RESECTION OF THE PROSTATE WITH GYRUS INSTRUMENTS;  Surgeon: Bernestine Amass, MD;  Location: [facility];  Service: Urology;  Laterality: N/A;  . VASECTOMY     Family History  Problem Relation Age of Onset  . Alcohol abuse Father   . Dementia Father   . Esophageal varices Father   . Hyperlipidemia Mother   . Hypertension Mother   . Colon cancer Neg Hx   . Esophageal cancer Neg Hx   . Stomach cancer Neg Hx   . Rectal cancer Neg Hx    Social History   Substance and Sexual Activity  Sexual Activity Not on file    Outpatient Encounter Medications as of 07/03/2017  Medication Sig  . apixaban (ELIQUIS) 5 MG TABS tablet Take 1 tablet (5 mg total) by mouth 2 (two) times daily.  Marland Kitchen atorvastatin (LIPITOR) 40 MG tablet Take 1 tablet (40 mg total) by mouth daily.  . carvedilol (COREG) 3.125 MG tablet Take 1 tablet (3.125  mg total) by mouth 2 (two) times daily.  . cetirizine (ZYRTEC) 10 MG tablet Take 10 mg by mouth as needed for allergies.   Marland Kitchen colchicine 0.6 MG tablet Take 1 tablet (0.6 mg total) by mouth 2 (two) times daily as needed (pain). Reported on 10/03/2015  . flecainide (TAMBOCOR) 50 MG tablet Take 1.5 tablets (75 mg total) by mouth 2 (two) times daily.  . furosemide (LASIX) 20 MG tablet Take 1 tablet (20 mg total) by mouth daily.  Marland Kitchen gabapentin (NEURONTIN) 300 MG capsule TAKE 1 CAPSULE AT BEDTIME  . hydrALAZINE (APRESOLINE) 10 MG tablet TAKE 1 TABLET TWICE A DAY  . levothyroxine (SYNTHROID, LEVOTHROID) 50 MCG tablet Take 1 tablet (50 mcg total) by mouth daily.  Marland Kitchen omeprazole (PRILOSEC) 20 MG capsule Take 20 mg by mouth daily as needed (acid reflux).  . valsartan-hydrochlorothiazide (DIOVAN-HCT)  320-12.5 MG tablet TAKE 1 TABLET DAILY  . [DISCONTINUED] amLODipine (NORVASC) 5 MG tablet Take 1 tablet (5 mg total) by mouth daily.  . [DISCONTINUED] colchicine 0.6 MG tablet Take 1 tablet (0.6 mg total) by mouth 2 (two) times daily as needed (pain). Reported on 10/03/2015  . amLODipine (NORVASC) 10 MG tablet Take 1 tablet (10 mg total) by mouth daily.   No facility-administered encounter medications on file as of 07/03/2017.     Activities of Daily Living In your present state of health, do you have any difficulty performing the following activities: 07/03/2017 07/03/2017  Hearing? N N  Vision? N N  Difficulty concentrating or making decisions? N N  Walking or climbing stairs? N N  Dressing or bathing? N N  Doing errands, shopping? N N  Preparing Food and eating ? N -  Using the Toilet? N -  In the past six months, have you accidently leaked urine? N -  Do you have problems with loss of bowel control? N -  Managing your Medications? N -  Managing your Finances? N -  Housekeeping or managing your Housekeeping? N -  Some recent data might be hidden    Patient Care Team: [name], MD as PCP - General (Family Medicine) [name], MD as Consulting Physician (Urology) Constance Haw, MD as Consulting Physician (Cardiology)   Assessment:    Physical assessment deferred to PCP.  Exercise Activities and Dietary recommendations Current Exercise Habits: Home exercise routine, Exercise limited by: None identified   Diet (meal preparation, eat out, water intake, caffeinated beverages, dairy products, fruits and vegetables): Drinks tea, coffee and water.   Attempts to eat heart healthy diet.   Goals    . Weight (lb) < 200 lb (90.7 kg)     Lose weight by increasing activity.       Fall Risk Fall Risk  07/03/2017 07/03/2017 07/02/2016 10/03/2015 03/31/2015  Falls in the past year? No No No No No   Depression Screen PHQ 2/9 Scores 07/03/2017 07/03/2017  07/02/2016 10/03/2015  PHQ - 2 Score 0 0 0 0  PHQ- 9 Score - 0 0 -  Exception Documentation - - - Patient refusal    Cognitive Function MMSE - Mini Mental State Exam 07/03/2017  Not completed: (No Data)  Orientation to time 5  Orientation to Place 5  Registration 3  Attention/ Calculation 0  Recall 3  Language- name 2 objects 2  Language- repeat 1  Language- follow 3 step command 3  Language- read & follow direction 1  Write a sentence 1  Copy design 1  Total score 25  Immunization History  Administered Date(s) Administered  . Influenza Split 06/27/2011, 07/02/2012  . Influenza Whole 09/24/2007, 05/03/2008, 09/28/2009  . Influenza, High Dose Seasonal PF 06/01/2013  . Influenza,inj,Quad PF,6+ Mos 06/30/2014, 05/18/2015, 07/02/2016, 06/17/2017  . Pneumococcal Conjugate-13 03/31/2015  . Pneumococcal Polysaccharide-23 10/25/2008, 07/02/2016  . Td 09/24/2007  . Zoster 05/14/2012   Screening Tests Health Maintenance  Topic Date Due  . TETANUS/TDAP  09/23/2017  . COLONOSCOPY  06/14/2027  . INFLUENZA VACCINE  Completed  . Hepatitis C Screening  Completed  . PNA vac Low Risk Adult  Completed      Plan:     Let us know about shingles vaccine.   Continue doing brain stimulating activities (puzzles, reading, adult coloring books, staying active) to keep memory sharp.   Bring a copy of your living will and/or healthcare power of attorney to your next office visit.  I have personally reviewed and noted the following in the patient's chart:   . Medical and social history . Use of alcohol, tobacco or illicit drugs  . Current medications and supplements . Functional ability and status . Nutritional status . Physical activity . Advanced directives . List of other physicians . Hospitalizations, surgeries, and ER visits in previous 12 months . Vitals . Screenings to include cognitive, depression, and falls . Referrals and appointments  In addition, I have  reviewed and discussed with patient certain preventive protocols, quality metrics, and best practice recommendations. A written personalized care plan for preventive services as well as general preventive health recommendations were provided to patient.     Gerilyn Nestle, RN  07/03/2017  Reviewed documentation and agree w/ above.  Annye Asa, MD

## 2017-07-02 NOTE — Telephone Encounter (Signed)
**Note De-Identified Nakota Elsen Obfuscation** I did a PA for Eliquis through covermymeds and received an immediate approval message: Start Date:06/02/2017;Coverage End Date:07/02/2018

## 2017-07-03 ENCOUNTER — Ambulatory Visit (INDEPENDENT_AMBULATORY_CARE_PROVIDER_SITE_OTHER): Payer: Medicare Other | Admitting: Family Medicine

## 2017-07-03 ENCOUNTER — Encounter: Payer: Self-pay | Admitting: Family Medicine

## 2017-07-03 ENCOUNTER — Other Ambulatory Visit: Payer: Self-pay

## 2017-07-03 VITALS — BP 143/84 | HR 61 | Temp 98.0°F | Resp 16 | Ht 71.0 in | Wt 221.1 lb

## 2017-07-03 DIAGNOSIS — E038 Other specified hypothyroidism: Secondary | ICD-10-CM | POA: Diagnosis not present

## 2017-07-03 DIAGNOSIS — I10 Essential (primary) hypertension: Secondary | ICD-10-CM | POA: Diagnosis not present

## 2017-07-03 DIAGNOSIS — E785 Hyperlipidemia, unspecified: Secondary | ICD-10-CM | POA: Diagnosis not present

## 2017-07-03 LAB — HEPATIC FUNCTION PANEL
ALBUMIN: 4.3 g/dL (ref 3.5–5.2)
ALK PHOS: 61 U/L (ref 39–117)
ALT: 29 U/L (ref 0–53)
AST: 25 U/L (ref 0–37)
BILIRUBIN DIRECT: 0.1 mg/dL (ref 0.0–0.3)
BILIRUBIN TOTAL: 0.8 mg/dL (ref 0.2–1.2)
TOTAL PROTEIN: 7.1 g/dL (ref 6.0–8.3)

## 2017-07-03 LAB — CBC WITH DIFFERENTIAL/PLATELET
BASOS ABS: 0 10*3/uL (ref 0.0–0.1)
Basophils Relative: 0.5 % (ref 0.0–3.0)
Eosinophils Absolute: 0.1 10*3/uL (ref 0.0–0.7)
Eosinophils Relative: 1.6 % (ref 0.0–5.0)
HCT: 49.5 % (ref 39.0–52.0)
HEMOGLOBIN: 17 g/dL (ref 13.0–17.0)
LYMPHS ABS: 1.9 10*3/uL (ref 0.7–4.0)
Lymphocytes Relative: 24.4 % (ref 12.0–46.0)
MCHC: 34.4 g/dL (ref 30.0–36.0)
MCV: 89.3 fl (ref 78.0–100.0)
MONOS PCT: 12 % (ref 3.0–12.0)
Monocytes Absolute: 0.9 10*3/uL (ref 0.1–1.0)
NEUTROS PCT: 61.5 % (ref 43.0–77.0)
Neutro Abs: 4.7 10*3/uL (ref 1.4–7.7)
Platelets: 328 10*3/uL (ref 150.0–400.0)
RBC: 5.53 Mil/uL (ref 4.22–5.81)
RDW: 13 % (ref 11.5–15.5)
WBC: 7.7 10*3/uL (ref 4.0–10.5)

## 2017-07-03 LAB — BASIC METABOLIC PANEL
BUN: 15 mg/dL (ref 6–23)
CALCIUM: 9.8 mg/dL (ref 8.4–10.5)
CO2: 33 mEq/L — ABNORMAL HIGH (ref 19–32)
Chloride: 99 mEq/L (ref 96–112)
Creatinine, Ser: 1.09 mg/dL (ref 0.40–1.50)
GFR: 70.46 mL/min (ref >60.00)
Glucose, Bld: 112 mg/dL — ABNORMAL HIGH (ref 70–99)
POTASSIUM: 3.9 meq/L (ref 3.5–5.1)
SODIUM: 141 meq/L (ref 135–145)

## 2017-07-03 LAB — LIPID PANEL
CHOL/HDL RATIO: 4
Cholesterol: 152 mg/dL (ref 0–200)
HDL: 36.3 mg/dL — AB (ref >39.00)
LDL CALC: 86 mg/dL (ref 0–99)
NONHDL: 115.87
TRIGLYCERIDES: 150 mg/dL — AB (ref 0.0–149.0)
VLDL: 30 mg/dL (ref 0.0–40.0)

## 2017-07-03 LAB — TSH: TSH: 2.97 u[IU]/mL (ref 0.35–4.50)

## 2017-07-03 MED ORDER — AMLODIPINE BESYLATE 10 MG PO TABS: 10.0000 mg | ORAL_TABLET | Freq: Every day | ORAL | 3 refills | Status: DC

## 2017-07-03 MED ORDER — COLCHICINE 0.6 MG PO TABS: 0.6000 mg | ORAL_TABLET | Freq: Two times a day (BID) | ORAL | 3 refills | Status: DC | PRN

## 2017-07-03 NOTE — Assessment & Plan Note (Signed)
BP is running consistently in the 140s/80s.  He is asymptomatic but we will increase the Amlodipine to 10mg  daily to improve BP control.  Pt expressed understanding and is in agreement w/ plan.

## 2017-07-03 NOTE — Progress Notes (Signed)
   Subjective:    Patient ID: Stephen Morrison, male    DOB: 1946/02/04, 71 y.o.   MRN: [id number]  HPI HTN- chronic problem, on Coreg 3.125 BID, Lasix 20mg  daily, Hydralazine 10mg  BID, Valsartan HCTZ 320-12.5mg  daily, and Amlodipine 5mg  daily.  Pt reports home BPs typically run 140s/80s.  Denies CP, SOB, HAs, visual changes, edema.  Hyperlipidemia- chronic problem, on Lipitor 40mg  daily w/ hx of good control.  Denies abd pain, N/V, myalgias.  Hypothyroid- chronic problem, on Levothyroxine 99mcg daily.  Denies excessive fatigue, diarrhea/constipation, or changes to skin/hair/nails   Review of Systems For ROS see HPI     Objective:   Physical Exam  Constitutional: He is oriented to person, place, and time. He appears well-developed and well-nourished. No distress.  HENT:  Head: Normocephalic and atraumatic.  Eyes: Conjunctivae and EOM are normal. Pupils are equal, round, and reactive to light.  Neck: Normal range of motion. Neck supple. No thyromegaly present.  Cardiovascular: Normal rate, regular rhythm, normal heart sounds and intact distal pulses.  No murmur heard. Pulmonary/Chest: Effort normal and breath sounds normal. No respiratory distress.  Abdominal: Soft. Bowel sounds are normal. He exhibits no distension.  Musculoskeletal: He exhibits no edema.  Lymphadenopathy:    He has no cervical adenopathy.  Neurological: He is alert and oriented to person, place, and time. No cranial nerve deficit.  Skin: Skin is warm and dry.  Psychiatric: He has a normal mood and affect. His behavior is normal.  Vitals reviewed.         Assessment & Plan:

## 2017-07-03 NOTE — Patient Instructions (Addendum)
Follow up in 4-6 weeks to recheck BP We'll notify you of your lab results and make any changes if needed Increase the Amlodipine to 10mg  daily- 2 of what you have at home and 1 of the new prescription Continue to work on healthy diet and regular exercise- you can do it!!! Call with any questions or concerns Happy Thanksgiving!!!  Let us know about shingles vaccine.   Continue doing brain stimulating activities (puzzles, reading, adult coloring books, staying active) to keep memory sharp.   Bring a copy of your living will and/or healthcare power of attorney to your next office visit.   Health Maintenance, Male A healthy lifestyle and preventive care is important for your health and wellness. Ask your health care provider about what schedule of regular examinations is right for you. What should I know about weight and diet? Eat a Healthy Diet  Eat plenty of vegetables, fruits, whole grains, low-fat dairy products, and lean protein.  Do not eat a lot of foods high in solid fats, added sugars, or salt.  Maintain a Healthy Weight Regular exercise can help you achieve or maintain a healthy weight. You should:  Do at least 150 minutes of exercise each week. The exercise should increase your heart rate and make you sweat (moderate-intensity exercise).  Do strength-training exercises at least twice a week.  Watch Your Levels of Cholesterol and Blood Lipids  Have your blood tested for lipids and cholesterol every 5 years starting at 73 years of age. If you are at high risk for heart disease, you should start having your blood tested when you are 73 years old. You may need to have your cholesterol levels checked more often if: ? Your lipid or cholesterol levels are high. ? You are older than 73 years of age. ? You are at high risk for heart disease.  What should I know about cancer screening? Many types of cancers can be detected early and may often be prevented. Lung Cancer  You  should be screened every year for lung cancer if: ? You are a current smoker who has smoked for at least 30 years. ? You are a former smoker who has quit within the past 15 years.  Talk to your health care provider about your screening options, when you should start screening, and how often you should be screened.  Colorectal Cancer  Routine colorectal cancer screening usually begins at 73 years of age and should be repeated every 5-10 years until you are 73 years old. You may need to be screened more often if early forms of precancerous polyps or small growths are found. Your health care provider may recommend screening at an earlier age if you have risk factors for colon cancer.  Your health care provider may recommend using home test kits to check for hidden blood in the stool.  A small camera at the end of a tube can be used to examine your colon (sigmoidoscopy or colonoscopy). This checks for the earliest forms of colorectal cancer.  Prostate and Testicular Cancer  Depending on your age and overall health, your health care provider may do certain tests to screen for prostate and testicular cancer.  Talk to your health care provider about any symptoms or concerns you have about testicular or prostate cancer.  Skin Cancer  Check your skin from head to toe regularly.  Tell your health care provider about any new moles or changes in moles, especially if: ? There is a change in a mole's  size, shape, or color. ? You have a mole that is larger than a pencil eraser.  Always use sunscreen. Apply sunscreen liberally and repeat throughout the day.  Protect yourself by wearing long sleeves, pants, a wide-brimmed hat, and sunglasses when outside.  What should I know about heart disease, diabetes, and high blood pressure?  If you are 2-13 years of age, have your blood pressure checked every 3-5 years. If you are 5 years of age or older, have your blood pressure checked every year. You  should have your blood pressure measured twice-once when you are at a hospital or clinic, and once when you are not at a hospital or clinic. Record the average of the two measurements. To check your blood pressure when you are not at a hospital or clinic, you can use: ? An automated blood pressure machine at a pharmacy. ? A home blood pressure monitor.  Talk to your health care provider about your target blood pressure.  If you are between 37-26 years old, ask your health care provider if you should take aspirin to prevent heart disease.  Have regular diabetes screenings by checking your fasting blood sugar level. ? If you are at a normal weight and have a low risk for diabetes, have this test once every three years after the age of 21. ? If you are overweight and have a high risk for diabetes, consider being tested at a younger age or more often.  A one-time screening for abdominal aortic aneurysm (AAA) by ultrasound is recommended for men aged 64-75 years who are current or former smokers. What should I know about preventing infection? Hepatitis B If you have a higher risk for hepatitis B, you should be screened for this virus. Talk with your health care provider to find out if you are at risk for hepatitis B infection. Hepatitis C Blood testing is recommended for:  Everyone born from [birth year] through 1967.  Anyone with known risk factors for hepatitis C.  Sexually Transmitted Diseases (STDs)  You should be screened each year for STDs including gonorrhea and chlamydia if: ? You are sexually active and are younger than 73 years of age. ? You are older than 73 years of age and your health care provider tells you that you are at risk for this type of infection. ? Your sexual activity has changed since you were last screened and you are at an increased risk for chlamydia or gonorrhea. Ask your health care provider if you are at risk.  Talk with your health care provider about whether you are  at high risk of being infected with HIV. Your health care provider may recommend a prescription medicine to help prevent HIV infection.  What else can I do?  Schedule regular health, dental, and eye exams.  Stay current with your vaccines (immunizations).  Do not use any tobacco products, such as cigarettes, chewing tobacco, and e-cigarettes. If you need help quitting, ask your health care provider.  Limit alcohol intake to no more than 2 drinks per day. One drink equals 12 ounces of beer, 5 ounces of wine, or 1 ounces of hard liquor.  Do not use street drugs.  Do not share needles.  Ask your health care provider for help if you need support or information about quitting drugs.  Tell your health care provider if you often feel depressed.  Tell your health care provider if you have ever been abused or do not feel safe at home. This information is not  intended to replace advice given to you by your health care provider. Make sure you discuss any questions you have with your health care provider. Document Released: 01/26/2008 Document Revised: 03/28/2016 Document Reviewed: 05/03/2015 Elsevier Interactive Patient Education  [employer].

## 2017-07-03 NOTE — Assessment & Plan Note (Signed)
Chronic problem.  Tolerating statin w/o difficulty.  Stressed need for healthy diet and regular exercise.  Check labs.  Adjust meds prn  

## 2017-07-03 NOTE — Assessment & Plan Note (Signed)
Chronic problem.  Currently asymptomatic.  Check labs.  Adjust meds prn  

## 2017-07-08 ENCOUNTER — Encounter: Payer: Self-pay | Admitting: General Practice

## 2017-08-07 DIAGNOSIS — K5730 Diverticulosis of large intestine without perforation or abscess without bleeding: Secondary | ICD-10-CM | POA: Insufficient documentation

## 2017-08-07 DIAGNOSIS — K219 Gastro-esophageal reflux disease without esophagitis: Secondary | ICD-10-CM | POA: Insufficient documentation

## 2017-08-07 DIAGNOSIS — I10 Essential (primary) hypertension: Secondary | ICD-10-CM | POA: Insufficient documentation

## 2017-08-07 DIAGNOSIS — T7840XA Allergy, unspecified, initial encounter: Secondary | ICD-10-CM | POA: Insufficient documentation

## 2017-08-07 DIAGNOSIS — R972 Elevated prostate specific antigen [PSA]: Secondary | ICD-10-CM | POA: Insufficient documentation

## 2017-08-07 DIAGNOSIS — R339 Retention of urine, unspecified: Secondary | ICD-10-CM | POA: Insufficient documentation

## 2017-08-07 DIAGNOSIS — N400 Enlarged prostate without lower urinary tract symptoms: Secondary | ICD-10-CM | POA: Insufficient documentation

## 2017-08-07 DIAGNOSIS — I4891 Unspecified atrial fibrillation: Secondary | ICD-10-CM | POA: Insufficient documentation

## 2017-08-07 DIAGNOSIS — M1990 Unspecified osteoarthritis, unspecified site: Secondary | ICD-10-CM | POA: Insufficient documentation

## 2017-08-19 ENCOUNTER — Other Ambulatory Visit: Payer: Self-pay

## 2017-08-19 ENCOUNTER — Encounter: Payer: Self-pay | Admitting: Family Medicine

## 2017-08-19 ENCOUNTER — Ambulatory Visit (INDEPENDENT_AMBULATORY_CARE_PROVIDER_SITE_OTHER): Payer: Medicare Other | Admitting: Family Medicine

## 2017-08-19 VITALS — BP 131/82 | HR 58 | Temp 98.1°F | Resp 16 | Ht 71.0 in | Wt 224.2 lb

## 2017-08-19 DIAGNOSIS — I10 Essential (primary) hypertension: Secondary | ICD-10-CM

## 2017-08-19 NOTE — Assessment & Plan Note (Signed)
Chronic problem.  BP is better controlled since increasing the Amlodipine to 10mg  daily.  Asymptomatic.  No need for labs at this time.  Will continue to follow.

## 2017-08-19 NOTE — Progress Notes (Signed)
   Subjective:    Patient ID: Stephen Morrison, male    DOB: Jan 22, 1946, 72 y.o.   MRN: [id number]  HPI HTN- chronic problem.  At last visit Amlodipine was increased to 10mg  daily.  Also on Coreg BID, Lasix, Hydralazine, Valsartan HCTZ.  BP is better controlled today.  Denies CP, SOB, HAs, visual changes, edema.   Review of Systems For ROS see HPI     Objective:   Physical Exam  Constitutional: He is oriented to person, place, and time. He appears well-developed and well-nourished. No distress.  HENT:  Head: Normocephalic and atraumatic.  Eyes: Conjunctivae and EOM are normal. Pupils are equal, round, and reactive to light.  Neck: Normal range of motion. Neck supple. No thyromegaly present.  Cardiovascular: Normal rate, regular rhythm, normal heart sounds and intact distal pulses.  No murmur heard. Pulmonary/Chest: Effort normal and breath sounds normal. No respiratory distress.  Abdominal: Soft. Bowel sounds are normal. He exhibits no distension.  Musculoskeletal: He exhibits no edema.  Lymphadenopathy:    He has no cervical adenopathy.  Neurological: He is alert and oriented to person, place, and time. No cranial nerve deficit.  Skin: Skin is warm and dry.  Psychiatric: He has a normal mood and affect. His behavior is normal.  Vitals reviewed.         Assessment & Plan:

## 2017-08-19 NOTE — Patient Instructions (Signed)
Follow up in 3-4 months to recheck BP and cholesterol No med changes at this time BP looks great!  Keep up the good work! Call with any questions or concerns Happy New Year!!

## 2017-08-22 ENCOUNTER — Other Ambulatory Visit: Payer: Self-pay | Admitting: Cardiology

## 2017-08-22 NOTE — Telephone Encounter (Signed)
Pt last saw Dr Curt Bears 09/19/16, last labs 07/03/17 Creat 1.09, age 72, weight 101.7kg, based on specified criteria pt is on appropriate dosage of Eliquis 5mg  BID.  Will refill rx.

## 2017-08-28 ENCOUNTER — Ambulatory Visit (INDEPENDENT_AMBULATORY_CARE_PROVIDER_SITE_OTHER): Payer: Medicare Other | Admitting: Cardiology

## 2017-08-28 ENCOUNTER — Encounter: Payer: Self-pay | Admitting: Cardiology

## 2017-08-28 VITALS — BP 132/80 | HR 57 | Ht 71.0 in | Wt 226.0 lb

## 2017-08-28 DIAGNOSIS — I10 Essential (primary) hypertension: Secondary | ICD-10-CM | POA: Diagnosis not present

## 2017-08-28 DIAGNOSIS — I480 Paroxysmal atrial fibrillation: Secondary | ICD-10-CM | POA: Diagnosis not present

## 2017-08-28 MED ORDER — CARVEDILOL 3.125 MG PO TABS: 3.1250 mg | ORAL_TABLET | Freq: Two times a day (BID) | ORAL | 3 refills | Status: DC

## 2017-08-28 NOTE — Addendum Note (Signed)
Addended by: Stanton Kidney on: 08/28/2017 05:47 PM   Modules accepted: Orders

## 2017-08-28 NOTE — Progress Notes (Signed)
Electrophysiology Office Note   Date:  08/28/2017   ID:  Stephen Morrison, Stephen Morrison, Stephen Morrison, MRN [id number]  PCP:  [name], MD  Primary Electrophysiologist:  Constance Haw, MD    Chief Complaint  Patient presents with  . Atrial Fibrillation     History of Present Illness: Stephen Morrison is a 72 y.o. male who presents today for electrophysiology evaluation.   He has a history of HTn, HLD, and AF.  He presented to the ER on 3/25 with one hour of palpitations.  At that time, the palpitations woke him from sleep.  The patient was cardioverted out of AF in the ER.  He was started on Eliquis at that time and told to follow up in clinic but was switched to Xarelto in the interim.    Today, denies symptoms of palpitations, chest pain, shortness of breath, orthopnea, PND, lower extremity edema, claudication, dizziness, presyncope, syncope, bleeding, or neurologic sequela. The patient is tolerating medications without difficulties.  He is currently feeling well without issues.  He did decrease his Coreg dose to 3.125 mg as he was having significant fatigue on higher doses.  His fatigue is greatly improved.   Past Medical History:  Diagnosis Date  . Allergy   . Arthritis   . Atrial fibrillation ([place])    one time incident, on medication now with no further problems  . BPH (benign prostatic hyperplasia)   . Diverticulosis of colon   . GERD (gastroesophageal reflux disease)   . Gout    STABLE PER PT ON 05-12-2014  . Hyperlipidemia   . Hypertension   . Hypothyroidism   . PSA elevation   . Tick bite 02/25/2016  . Urinary retention    Past Surgical History:  Procedure Laterality Date  . COLONOSCOPY  01-31-2007  . EXCISION BENIGN CYST POSTERIOR NECK  2010  . INSERTION OF SUPRAPUBIC CATHETER N/A 05/17/2014   Procedure: INSERTION OF SUPRAPUBIC CATHETER;  Surgeon: Bernestine Amass, MD;  Location: [facility];  Service: Urology;  Laterality: N/A;  . ORIF RIGHT WRIST FX   2009  . TRANSURETHRAL RESECTION OF PROSTATE N/A 05/17/2014   Procedure: TRANSURETHRAL RESECTION OF THE PROSTATE WITH GYRUS INSTRUMENTS;  Surgeon: Bernestine Amass, MD;  Location: [facility];  Service: Urology;  Laterality: N/A;  . VASECTOMY       Current Outpatient Medications  Medication Sig Dispense Refill  . amLODipine (NORVASC) 10 MG tablet Take 1 tablet (10 mg total) by mouth daily. 30 tablet 3  . atorvastatin (LIPITOR) 40 MG tablet Take 1 tablet (40 mg total) by mouth daily. 90 tablet 1  . carvedilol (COREG) 3.125 MG tablet Take 1 tablet (3.125 mg total) by mouth 2 (two) times daily. 60 tablet 6  . cetirizine (ZYRTEC) 10 MG tablet Take 10 mg by mouth as needed for allergies.     Marland Kitchen colchicine 0.6 MG tablet Take 1 tablet (0.6 mg total) by mouth 2 (two) times daily as needed (pain). Reported on 10/03/2015 30 tablet 3  . ELIQUIS 5 MG TABS tablet TAKE 1 TABLET TWICE A DAY 180 tablet 1  . flecainide (TAMBOCOR) 50 MG tablet Take 1.5 tablets (75 mg total) by mouth 2 (two) times daily. 270 tablet 1  . furosemide (LASIX) 20 MG tablet Take 1 tablet (20 mg total) by mouth daily. 90 tablet 1  . gabapentin (NEURONTIN) 300 MG capsule TAKE 1 CAPSULE AT BEDTIME 90 capsule 1  . hydrALAZINE (APRESOLINE) 10 MG  tablet TAKE 1 TABLET TWICE A DAY 180 tablet 1  . levothyroxine (SYNTHROID, LEVOTHROID) 50 MCG tablet Take 1 tablet (50 mcg total) by mouth daily. 90 tablet 1  . omeprazole (PRILOSEC) 20 MG capsule Take 20 mg by mouth daily as needed (acid reflux).    . valsartan-hydrochlorothiazide (DIOVAN-HCT) 320-12.5 MG tablet TAKE 1 TABLET DAILY 90 tablet 1   No current facility-administered medications for this visit.     Allergies:   Amoxicillin   Social History:  The patient  reports that he quit smoking about 44 years ago. His smoking use included cigarettes. He has a 10.00 pack-year smoking history. He quit smokeless tobacco use about 43 years ago. His smokeless tobacco use included snuff and  chew. He reports that he drinks alcohol. He reports that he does not use drugs.   Family History:  The patient's family history includes Alcohol abuse in his father; Dementia in his father; Esophageal varices in his father; Hyperlipidemia in his mother; Hypertension in his mother.    ROS:  Please see the history of present illness.   Otherwise, review of systems is positive for easy bruising.   All other systems are reviewed and negative.   PHYSICAL EXAM: VS:  BP 132/80   Pulse (!) 57   Ht 5\' 11"  (1.803 m)   Wt 226 lb (102.5 kg)   BMI 31.52 kg/m  , BMI Body mass index is 31.52 kg/m. GEN: Well nourished, well developed, in no acute distress  HEENT: normal  Neck: no JVD, carotid bruits, or masses Cardiac: RRR; no murmurs, rubs, or gallops,no edema  Respiratory:  clear to auscultation bilaterally, normal work of breathing GI: soft, nontender, nondistended, + BS MS: no deformity or atrophy  Skin: warm and dry Neuro:  Strength and sensation are intact Psych: euthymic mood, full affect  EKG:  EKG is ordered today. Personal review of the ekg ordered shows this rhythm, first-degree AV block, voltage criteria for LVH, rate 57  Recent Labs: 07/03/2017: ALT 29; BUN 15; Creatinine, Ser 1.09; Hemoglobin 17.0; Platelets 328.0; Potassium 3.9; Sodium 141; TSH 2.97    Lipid Panel     Component Value Date/Time   CHOL 152 07/03/2017 0945   TRIG 150.0 (H) 07/03/2017 0945   HDL 36.30 (L) 07/03/2017 0945   CHOLHDL 4 07/03/2017 0945   VLDL 30.0 07/03/2017 0945   LDLCALC 86 07/03/2017 0945   LDLDIRECT 123.3 06/01/2013 1237     Wt Readings from Last 3 Encounters:  08/28/17 226 lb (102.5 kg)  08/19/17 224 lb 4 oz (101.7 kg)  07/03/17 221 lb 2 oz (100.3 kg)      Other studies Reviewed: Additional studies/ records that were reviewed today include:  TTE 12/09/15 - Left ventricle: The cavity size was normal. Wall thickness was  increased in a pattern of mild LVH. Systolic function was  normal.  The estimated ejection fraction was in the range of 60% to 65%.  Wall motion was normal; there were no regional wall motion  abnormalities.    ASSESSMENT AND PLAN:  1.  Atrial fibrillation: Currently on Xarelto, Coreg, and flecainide.  He is feeling well without further episodes of atrial fibrillation.  He had to decrease his Coreg dose due to fatigue, which has greatly improved since the dose change.  No changes at this time.  This patients CHA2DS2-VASc Score and unadjusted Ischemic Stroke Rate (% per year) is equal to 2.2 % stroke rate/year from a score of 2  Above score calculated as 1  point each if present [CHF, HTN, DM, Vascular=MI/PAD/Aortic Plaque, Age if 65-74, or Male] Above score calculated as 2 points each if present [Age > 75, or Stroke/TIA/TE]  2. Hypertension: Today.  No changes at this time.  Current medicines are reviewed at length with the patient today.   The patient does not have concerns regarding his medicines.  The following changes were made today: None  Labs/ tests ordered today include:  No orders of the defined types were placed in this encounter.    Disposition:   FU with Inigo Lantigua 12 months  Signed, Kym Fenter Meredith Leeds, MD  08/28/2017 9:00 AM     [facility] [address] [place] [address] [phone] (office) [phone] (fax)

## 2017-08-28 NOTE — Patient Instructions (Addendum)
Medication Instructions:  Your physician recommends that you continue on your current medications as directed. Please refer to the Current Medication list given to you today.  * If you need a refill on your cardiac medications before your next appointment, please call your pharmacy.   Labwork: None ordered  Testing/Procedures: None ordered  Follow-Up: Your physician wants you to follow-up in: 1 year with Dr. Camnitz.  You will receive a reminder letter in the mail two months in advance. If you don't receive a letter, please call our office to schedule the follow-up appointment.  Thank you for choosing [facility]!!   Kennede Lusk, RN [phone]        

## 2017-09-10 ENCOUNTER — Other Ambulatory Visit: Payer: Self-pay | Admitting: Family Medicine

## 2017-09-28 ENCOUNTER — Other Ambulatory Visit: Payer: Self-pay | Admitting: Cardiology

## 2017-12-09 ENCOUNTER — Other Ambulatory Visit: Payer: Self-pay

## 2017-12-09 ENCOUNTER — Ambulatory Visit (INDEPENDENT_AMBULATORY_CARE_PROVIDER_SITE_OTHER): Payer: Medicare Other | Admitting: Family Medicine

## 2017-12-09 ENCOUNTER — Encounter: Payer: Self-pay | Admitting: Family Medicine

## 2017-12-09 VITALS — BP 128/81 | HR 57 | Temp 98.1°F | Resp 16 | Ht 71.0 in | Wt 223.4 lb

## 2017-12-09 DIAGNOSIS — I10 Essential (primary) hypertension: Secondary | ICD-10-CM | POA: Diagnosis not present

## 2017-12-09 DIAGNOSIS — E038 Other specified hypothyroidism: Secondary | ICD-10-CM | POA: Diagnosis not present

## 2017-12-09 DIAGNOSIS — E785 Hyperlipidemia, unspecified: Secondary | ICD-10-CM

## 2017-12-09 LAB — CBC WITH DIFFERENTIAL/PLATELET
BASOS ABS: 0 10*3/uL (ref 0.0–0.1)
BASOS PCT: 0.7 % (ref 0.0–3.0)
Eosinophils Absolute: 0.1 10*3/uL (ref 0.0–0.7)
Eosinophils Relative: 1.8 % (ref 0.0–5.0)
HEMATOCRIT: 48.4 % (ref 39.0–52.0)
HEMOGLOBIN: 16.9 g/dL (ref 13.0–17.0)
LYMPHS PCT: 26.4 % (ref 12.0–46.0)
Lymphs Abs: 1.6 10*3/uL (ref 0.7–4.0)
MCHC: 34.8 g/dL (ref 30.0–36.0)
MCV: 88.1 fl (ref 78.0–100.0)
MONOS PCT: 11.6 % (ref 3.0–12.0)
Monocytes Absolute: 0.7 10*3/uL (ref 0.1–1.0)
NEUTROS ABS: 3.6 10*3/uL (ref 1.4–7.7)
Neutrophils Relative %: 59.5 % (ref 43.0–77.0)
PLATELETS: 327 10*3/uL (ref 150.0–400.0)
RBC: 5.5 Mil/uL (ref 4.22–5.81)
RDW: 13.2 % (ref 11.5–15.5)
WBC: 6.1 10*3/uL (ref 4.0–10.5)

## 2017-12-09 LAB — BASIC METABOLIC PANEL
BUN: 14 mg/dL (ref 6–23)
CO2: 30 mEq/L (ref 19–32)
Calcium: 9.3 mg/dL (ref 8.4–10.5)
Chloride: 100 mEq/L (ref 96–112)
Creatinine, Ser: 1 mg/dL (ref 0.40–1.50)
GFR: 77.73 mL/min (ref >60.00)
Glucose, Bld: 108 mg/dL — ABNORMAL HIGH (ref 70–99)
Potassium: 3.7 mEq/L (ref 3.5–5.1)
SODIUM: 139 meq/L (ref 135–145)

## 2017-12-09 LAB — HEPATIC FUNCTION PANEL
ALT: 29 U/L (ref 0–53)
AST: 22 U/L (ref 0–37)
Albumin: 4.3 g/dL (ref 3.5–5.2)
Alkaline Phosphatase: 57 U/L (ref 39–117)
BILIRUBIN DIRECT: 0.2 mg/dL (ref 0.0–0.3)
BILIRUBIN TOTAL: 0.9 mg/dL (ref 0.2–1.2)
Total Protein: 7 g/dL (ref 6.0–8.3)

## 2017-12-09 LAB — LIPID PANEL
CHOL/HDL RATIO: 5
Cholesterol: 153 mg/dL (ref 0–200)
HDL: 32.7 mg/dL — ABNORMAL LOW (ref >39.00)
LDL CALC: 89 mg/dL (ref 0–99)
NONHDL: 120.73
Triglycerides: 158 mg/dL — ABNORMAL HIGH (ref 0.0–149.0)
VLDL: 31.6 mg/dL (ref 0.0–40.0)

## 2017-12-09 LAB — TSH: TSH: 4.03 u[IU]/mL (ref 0.35–4.50)

## 2017-12-09 NOTE — Assessment & Plan Note (Signed)
Chronic problem, tolerating statin w/o difficulty.  Check labs.  Adjust meds prn  

## 2017-12-09 NOTE — Assessment & Plan Note (Signed)
Chronic problem, asymptomatic.  Check labs.  Adjust meds prn. 

## 2017-12-09 NOTE — Progress Notes (Signed)
   Subjective:    Patient ID: Stephen Morrison, male    DOB: January 28, 1946, 72 y.o.   MRN: [id number]  HPI HTN- chronic problem, on Amlodipine 10mg , Coreg 3.125mg  BID, Lasix 20mg  daily, Hydralazine 10mg  daily, Valsartan HCTZ 320/12.5mg  daily.  BP adequately controlled.  No CP, SOB, HAs, visual changes, edema above baseline.  Hyperlipidemia- chronic problem, on Lipitor 40mg  daily.  No abd pain, N/V.  Hypothyroid- chronic problem, on Levothyroxine 50mg  daily.  Asymptomatic.  Denies excessive fatigue.  Denies changes to skin/hair/nails.   Review of Systems For ROS see HPI     Objective:   Physical Exam  Constitutional: He is oriented to person, place, and time. He appears well-developed and well-nourished. No distress.  HENT:  Head: Normocephalic and atraumatic.  Eyes: Pupils are equal, round, and reactive to light. Conjunctivae and EOM are normal.  Neck: Normal range of motion. Neck supple. No thyromegaly present.  Cardiovascular: Normal rate, regular rhythm, normal heart sounds and intact distal pulses.  No murmur heard. Pulmonary/Chest: Effort normal and breath sounds normal. No respiratory distress.  Abdominal: Soft. Bowel sounds are normal. He exhibits no distension.  Musculoskeletal: He exhibits edema (trace to 1+ edema bilaterally).  Lymphadenopathy:    He has no cervical adenopathy.  Neurological: He is alert and oriented to person, place, and time. No cranial nerve deficit.  Skin: Skin is warm and dry.  Psychiatric: He has a normal mood and affect. His behavior is normal.  Vitals reviewed.         Assessment & Plan:

## 2017-12-09 NOTE — Assessment & Plan Note (Signed)
Chronic problem.  Adequate control.  Asymptomatic.  Check labs.  No anticipated med changes.  Will follow. 

## 2017-12-09 NOTE — Patient Instructions (Signed)
Follow up in 6 months to recheck BP and cholesterol We'll notify you of your lab results and make any changes if needed Continue to work on healthy diet and regular exercise- you look great!!! Call with any questions or concerns Happy Spring!!!

## 2017-12-10 ENCOUNTER — Encounter: Payer: Self-pay | Admitting: General Practice

## 2017-12-17 ENCOUNTER — Other Ambulatory Visit: Payer: Self-pay | Admitting: Family Medicine

## 2017-12-17 ENCOUNTER — Other Ambulatory Visit: Payer: Self-pay | Admitting: Cardiology

## 2017-12-17 NOTE — Telephone Encounter (Signed)
Ok to fill, last refilled by PCP who saw patient in April for hypertension.

## 2017-12-17 NOTE — Telephone Encounter (Signed)
Patient should have filled/refilled by PCP.

## 2017-12-25 MED ORDER — AMLODIPINE BESYLATE 10 MG PO TABS: 10.0000 mg | ORAL_TABLET | Freq: Every day | ORAL | 3 refills | Status: DC

## 2017-12-25 MED ORDER — AMLODIPINE BESYLATE 10 MG PO TABS: 10.0000 mg | ORAL_TABLET | Freq: Every day | ORAL | 1 refills | Status: DC

## 2017-12-25 NOTE — Telephone Encounter (Signed)
Please advise how to proceed? Ok to send local and mail order?

## 2017-12-25 NOTE — Telephone Encounter (Signed)
Medication filled to pharmacy as requested.  Will inform pt that meds were filled.

## 2017-12-25 NOTE — Telephone Encounter (Signed)
[place] for #30 to mail order and #90 to mail order

## 2017-12-25 NOTE — Telephone Encounter (Addendum)
Relation to pt: self  Call back number: [phone] Pharmacy: [place], [place] [phone] (Phone) [phone] (Fax)    Reason for call:  Patient checking on the status of  amLODipine (NORVASC) 10 MG tablet refill, informed patient request was sent to his cardiologist instead of PCP. Patient states his completely out and would like Rx sent into his mail order. Patient would like to know if its okay to be without medication for 1 week until mail order is received, please advise

## 2017-12-25 NOTE — Addendum Note (Signed)
Addended by: Davis Gourd on: 12/25/2017 09:56 AM   Modules accepted: Orders

## 2017-12-29 ENCOUNTER — Other Ambulatory Visit: Payer: Self-pay | Admitting: Cardiology

## 2017-12-30 NOTE — Telephone Encounter (Signed)
Outpatient Medication Detail    Disp Refills Start End   carvedilol (COREG) 3.125 MG tablet 180 tablet 3 08/28/2017    Sig - Route: Take 1 tablet (3.125 mg total) by mouth 2 (two) times daily. - Oral   Sent to pharmacy as: carvedilol (COREG) 3.125 MG tablet   Notes to Pharmacy: Patient does not need filled at this time   E-Prescribing Status: Receipt confirmed by pharmacy (08/28/2017 9:09 AM EST)   Pharmacy   [facility] [place], [place]

## 2018-01-09 ENCOUNTER — Other Ambulatory Visit: Payer: Self-pay

## 2018-01-09 MED ORDER — CARVEDILOL 3.125 MG PO TABS: 3.1250 mg | ORAL_TABLET | Freq: Two times a day (BID) | ORAL | 0 refills | Status: DC

## 2018-01-09 NOTE — Telephone Encounter (Signed)
ASSESSMENT AND PLAN:  1.  Atrial fibrillation: Currently on Xarelto, Coreg, and flecainide.  He is feeling well without further episodes of atrial fibrillation.  He had to decrease his Coreg dose due to fatigue, which has greatly improved since the dose change.  No changes at this time.

## 2018-01-09 NOTE — Telephone Encounter (Signed)
Patient aware and agreeable to take carvedilol 3.125 BID

## 2018-01-09 NOTE — Telephone Encounter (Signed)
Patient should be taking this BID. Please confirm that he is. Ok to refill

## 2018-01-16 ENCOUNTER — Telehealth: Payer: Self-pay | Admitting: Cardiology

## 2018-01-16 NOTE — Telephone Encounter (Signed)
Reviewed with pharmacist, Raquel.  Advised dentist office pt should stop Eliquis 1-2 days prior to this procedure. They appreciate the call back and will reschedule pt so that Eliquis can be held prior.

## 2018-01-16 NOTE — Telephone Encounter (Signed)
New Message:          [facility] Pre-operative Risk Assessment    Request for surgical clearance:  1. What type of surgery is being performed? Crown lengthening  2. When is this surgery scheduled? 01/16/18  3. What type of clearance is required (medical clearance vs. Pharmacy clearance to hold med vs. Both)? Pharmacy  4. Are there any medications that need to be held prior to surgery and how long?ELIQUIS 5 MG TABS tablet   5. Practice name and name of physician performing surgery? Dr. Purnell Shoemaker   6. What is your office phone number [phone]   7.   What is your office fax number [phone]  8.   Anesthesia type (None, local, MAC, general) ? Local   Stephen Morrison 01/16/2018, 10:54 AM  _________________________________________________________________   (provider comments below)

## 2018-01-27 ENCOUNTER — Other Ambulatory Visit: Payer: Self-pay

## 2018-01-27 MED ORDER — CARVEDILOL 3.125 MG PO TABS: 3.1250 mg | ORAL_TABLET | Freq: Two times a day (BID) | ORAL | 6 refills | Status: DC

## 2018-02-05 ENCOUNTER — Other Ambulatory Visit: Payer: Self-pay | Admitting: Cardiology

## 2018-02-17 ENCOUNTER — Other Ambulatory Visit: Payer: Self-pay | Admitting: Family Medicine

## 2018-02-25 ENCOUNTER — Other Ambulatory Visit: Payer: Self-pay | Admitting: General Practice

## 2018-02-25 MED ORDER — AMLODIPINE BESYLATE 10 MG PO TABS: 10.0000 mg | ORAL_TABLET | Freq: Every day | ORAL | 1 refills | Status: DC

## 2018-02-28 ENCOUNTER — Other Ambulatory Visit: Payer: Self-pay | Admitting: Cardiology

## 2018-02-28 NOTE — Telephone Encounter (Signed)
Pt last saw Dr Curt Bears 08/28/17, last labs 12/09/17 Creat 1.00, age 71, weight 101.3kg, based on specified criteria pt is on appropriate dosage of Eliquis 5mg  BID.  Will refill rx.

## 2018-03-02 ENCOUNTER — Other Ambulatory Visit: Payer: Self-pay | Admitting: Cardiology

## 2018-03-07 ENCOUNTER — Ambulatory Visit: Payer: Self-pay | Admitting: *Deleted

## 2018-03-07 ENCOUNTER — Encounter: Payer: Self-pay | Admitting: Physician Assistant

## 2018-03-07 ENCOUNTER — Other Ambulatory Visit: Payer: Self-pay

## 2018-03-07 ENCOUNTER — Ambulatory Visit (INDEPENDENT_AMBULATORY_CARE_PROVIDER_SITE_OTHER): Payer: Medicare Other | Admitting: Physician Assistant

## 2018-03-07 VITALS — BP 132/84 | HR 63 | Temp 98.6°F | Resp 15 | Ht 71.0 in | Wt 221.2 lb

## 2018-03-07 DIAGNOSIS — M545 Low back pain, unspecified: Secondary | ICD-10-CM

## 2018-03-07 MED ORDER — METHOCARBAMOL 500 MG PO TABS: 500.0000 mg | ORAL_TABLET | Freq: Three times a day (TID) | ORAL | 0 refills | Status: DC | PRN

## 2018-03-07 NOTE — Telephone Encounter (Signed)
Pt called with having some lower back pain. He bent over and could hardly get back up. This was about 5 days ago. He states that it is a little better except when he is trying to get up , sitting down or lying down he can not get comfortable. He is requesting a muscle relaxer. He thinks that it is a pulled muscle. He has used heat and cold to his back. No other symptoms reported, no numbness or tingling or any problems urinating and the pain does not radiate to any part of his body. Appointment scheduled per protocol. Will route to flow at [facility] at [facility]. Reason for Disposition . [1] MODERATE back pain (e.g., interferes with normal activities) AND [2] present > 3 days  Answer Assessment - Initial Assessment Questions 1. ONSET: "When did the pain begin?"      5 days ago 2. LOCATION: "Where does it hurt?" (upper, mid or lower back)     Lower back 3. SEVERITY: "How bad is the pain?"  (e.g., Scale 1-10; mild, moderate, or severe)   - MILD (1-3): doesn't interfere with normal activities    - MODERATE (4-7): interferes with normal activities or awakens from sleep    - SEVERE (8-10): excruciating pain, unable to do any normal activities      Uncomfortable when moving 4. PATTERN: "Is the pain constant?" (e.g., yes, no; constant, intermittent)      When sitting down or getting up 5. RADIATION: "Does the pain shoot into your legs or elsewhere?"     no 6. CAUSE:  "What do you think is causing the back pain?"      Just bent over 7. BACK OVERUSE:  "Any recent lifting of heavy objects, strenuous work or exercise?"     no 8. MEDICATIONS: "What have you taken so far for the pain?" (e.g., nothing, acetaminophen, NSAIDS)     Heat and cold  9. NEUROLOGIC SYMPTOMS: "Do you have any weakness, numbness, or problems with bowel/bladder control?"     no 10. OTHER SYMPTOMS: "Do you have any other symptoms?" (e.g., fever, abdominal pain, burning with urination, blood in urine)       no  Protocols  used: BACK PAIN-A-AH

## 2018-03-07 NOTE — Patient Instructions (Signed)
Please avoid heavy lifting and overexertion. Continue wearing your belt.  Apply heat to the lower back for 15 minutes, a few times per day. Topical Icy Hot may also be beneficial. Use tylenol for pain if needed. The Robaxin should help with tension but can make you very sleepy. Only take when at home and no more than as directed.

## 2018-03-07 NOTE — Progress Notes (Signed)
Patient presents to clinic today c/o 9 days of lower back pain, bilateral. Pain is described as aching and tight in nature. Does note radiate elsewhere the back or down into the legs. Denies trauma or injury. Pain has been up to a 9/10 but has improved since onset. Currently 1-2/10. Notes it is harder to get up and down. Pain alleviated with ambulation. .   Past Medical History:  Diagnosis Date  . Allergy   . Arthritis   . Atrial fibrillation ([place])    one time incident, on medication now with no further problems  . BPH (benign prostatic hyperplasia)   . Diverticulosis of colon   . GERD (gastroesophageal reflux disease)   . Gout    STABLE PER PT ON 05-12-2014  . Hyperlipidemia   . Hypertension   . Hypothyroidism   . PSA elevation   . Tick bite 02/25/2016  . Urinary retention     Current Outpatient Medications on File Prior to Visit  Medication Sig Dispense Refill  . amLODipine (NORVASC) 10 MG tablet Take 1 tablet (10 mg total) by mouth daily. 90 tablet 1  . atorvastatin (LIPITOR) 40 MG tablet TAKE 1 TABLET DAILY 90 tablet 1  . carvedilol (COREG) 3.125 MG tablet Take 1 tablet (3.125 mg total) by mouth 2 (two) times daily. 60 tablet 6  . cetirizine (ZYRTEC) 10 MG tablet Take 10 mg by mouth as needed for allergies.     Marland Kitchen colchicine 0.6 MG tablet Take 1 tablet (0.6 mg total) by mouth 2 (two) times daily as needed (pain). Reported on 10/03/2015 30 tablet 3  . ELIQUIS 5 MG TABS tablet TAKE 1 TABLET TWICE A DAY 180 tablet 1  . flecainide (TAMBOCOR) 50 MG tablet Take 1.5 tablets (75 mg total) by mouth 2 (two) times daily. 270 tablet 3  . furosemide (LASIX) 20 MG tablet TAKE 1 TABLET DAILY 90 tablet 1  . gabapentin (NEURONTIN) 300 MG capsule TAKE 1 CAPSULE AT BEDTIME 90 capsule 1  . hydrALAZINE (APRESOLINE) 10 MG tablet TAKE 1 TABLET TWICE A DAY 180 tablet 1  . levothyroxine (SYNTHROID, LEVOTHROID) 50 MCG tablet TAKE 1 TABLET DAILY 90 tablet 1  . omeprazole (PRILOSEC) 20 MG capsule Take 20  mg by mouth daily as needed (acid reflux).    . valsartan-hydrochlorothiazide (DIOVAN-HCT) 320-12.5 MG tablet TAKE 1 TABLET DAILY 90 tablet 1   No current facility-administered medications on file prior to visit.       Family History  Problem Relation Age of Onset  . Alcohol abuse Father   . Dementia Father   . Esophageal varices Father   . Hyperlipidemia Mother   . Hypertension Mother   . Colon cancer Neg Hx   . Esophageal cancer Neg Hx   . Stomach cancer Neg Hx   . Rectal cancer Neg Hx     Social History   Socioeconomic History  . Marital status: Married    Spouse name: Not on file  . Number of children: Not on file  . Years of education: Not on file  . Highest education level: Not on file  Occupational History  . Not on file  Social Needs  . Financial resource strain: Not on file  . Food insecurity:    Worry: Not on file    Inability: Not on file  . Transportation needs:    Medical: Not on file    Non-medical: Not on file  Tobacco Use  . Smoking status: Former Smoker  Packs/day: 1.00    Years: 10.00    Pack years: 10.00    Types: Cigarettes    Last attempt to quit: 08/13/1973    Years since quitting: 44.5  . Smokeless tobacco: Former Systems developer    Types: Snuff, Sarina Ser    Quit date: 05/12/1974  Substance and Sexual Activity  . Alcohol use: Yes    Alcohol/week: 0.0 oz    Comment: OCCASIONALLY  . Drug use: No  . Sexual activity: Not on file  Lifestyle  . Physical activity:    Days per week: Not on file    Minutes per session: Not on file  . Stress: Not on file  Relationships  . Social connections:    Talks on phone: Not on file    Gets together: Not on file    Attends religious service: Not on file    Active member of club or organization: Not on file    Attends meetings of clubs or organizations: Not on file    Relationship status: Not on file  Other Topics Concern  . Not on file  Social History Narrative  . Not on file   Review of Systems - See HPI.   All other ROS are negative.  BP 132/84   Pulse 63   Temp 98.6 F (37 C) (Oral)   Resp 15   Ht 5\' 11"  (1.803 m)   Wt 221 lb 3.2 oz (100.3 kg)   SpO2 94%   BMI 30.85 kg/m   Physical Exam  Constitutional: He appears well-developed and well-nourished.  HENT:  Head: Normocephalic and atraumatic.  Eyes: Conjunctivae are normal.  Cardiovascular: Normal rate, regular rhythm, normal heart sounds and intact distal pulses.  Pulmonary/Chest: Effort normal and breath sounds normal. No stridor. No respiratory distress. He has no wheezes. He has no rales. He exhibits no tenderness.  Musculoskeletal:       Thoracic back: Normal.       Lumbar back: He exhibits tenderness, pain and spasm. He exhibits normal range of motion, no bony tenderness and no swelling.  Vitals reviewed.   Recent Results (from the past 2160 hour(s))  Lipid panel     Status: Abnormal   Collection Time: 12/09/17  9:20 AM  Result Value Ref Range   Cholesterol 153 0 - 200 mg/dL    Comment: ATP III Classification       Desirable:  < 200 mg/dL               Borderline High:  200 - 239 mg/dL          High:  > = 240 mg/dL   Triglycerides 158.0 (H) 0.0 - 149.0 mg/dL    Comment: Normal:  <150 mg/dLBorderline High:  150 - 199 mg/dL   HDL 32.70 (L) >39.00 mg/dL   VLDL 31.6 0.0 - 40.0 mg/dL   LDL Cholesterol 89 0 - 99 mg/dL   Total CHOL/HDL Ratio 5     Comment:                Men          Women1/2 Average Risk     3.4          3.3Average Risk          5.0          4.42X Average Risk          9.6          7.13X Average Risk  15.0          11.0                       NonHDL 120.73     Comment: NOTE:  Non-HDL goal should be 30 mg/dL higher than patient's LDL goal (i.e. LDL goal of < 70 mg/dL, would have non-HDL goal of < 100 mg/dL)  Basic metabolic panel     Status: Abnormal   Collection Time: 12/09/17  9:20 AM  Result Value Ref Range   Sodium 139 135 - 145 mEq/L   Potassium 3.7 3.5 - 5.1 mEq/L   Chloride 100 96 - 112 mEq/L     CO2 30 19 - 32 mEq/L   Glucose, Bld 108 (H) 70 - 99 mg/dL   BUN 14 6 - 23 mg/dL   Creatinine, Ser 1.00 0.40 - 1.50 mg/dL   Calcium 9.3 8.4 - 10.5 mg/dL   GFR 77.73 >60.00 mL/min  TSH     Status: None   Collection Time: 12/09/17  9:20 AM  Result Value Ref Range   TSH 4.03 0.35 - 4.50 uIU/mL  Hepatic function panel     Status: None   Collection Time: 12/09/17  9:20 AM  Result Value Ref Range   Total Bilirubin 0.9 0.2 - 1.2 mg/dL   Bilirubin, Direct 0.2 0.0 - 0.3 mg/dL   Alkaline Phosphatase 57 39 - 117 U/L   AST 22 0 - 37 U/L   ALT 29 0 - 53 U/L   Total Protein 7.0 6.0 - 8.3 g/dL   Albumin 4.3 3.5 - 5.2 g/dL  CBC with Differential/Platelet     Status: None   Collection Time: 12/09/17  9:20 AM  Result Value Ref Range   WBC 6.1 4.0 - 10.5 K/uL   RBC 5.50 4.22 - 5.81 Mil/uL   Hemoglobin 16.9 13.0 - 17.0 g/dL   HCT 48.4 39.0 - 52.0 %   MCV 88.1 78.0 - 100.0 fl   MCHC 34.8 30.0 - 36.0 g/dL   RDW 13.2 11.5 - 15.5 %   Platelets 327.0 150.0 - 400.0 K/uL   Neutrophils Relative % 59.5 43.0 - 77.0 %   Lymphocytes Relative 26.4 12.0 - 46.0 %   Monocytes Relative 11.6 3.0 - 12.0 %   Eosinophils Relative 1.8 0.0 - 5.0 %   Basophils Relative 0.7 0.0 - 3.0 %   Neutro Abs 3.6 1.4 - 7.7 K/uL   Lymphs Abs 1.6 0.7 - 4.0 K/uL   Monocytes Absolute 0.7 0.1 - 1.0 K/uL   Eosinophils Absolute 0.1 0.0 - 0.7 K/uL   Basophils Absolute 0.0 0.0 - 0.1 K/uL   Assessment/Plan: 1. Acute bilateral low back pain without sciatica Improving. No indication for imaging today. Supportive measures and OTC medications reviewed. Continue heat application. Rx Robaxin to use sparingly for noted spasm. Stretches reviewed. Follow-up if not continuing to resolve.   - methocarbamol (ROBAXIN) 500 MG tablet; Take 1 tablet (500 mg total) by mouth every 8 (eight) hours as needed for muscle spasms.  Dispense: 15 tablet; Refill: 0   Leeanne Rio, PA-C

## 2018-03-07 NOTE — Telephone Encounter (Signed)
FYI, pt on your schedule this morning.

## 2018-03-12 ENCOUNTER — Other Ambulatory Visit: Payer: Self-pay | Admitting: Cardiology

## 2018-03-12 MED ORDER — CARVEDILOL 3.125 MG PO TABS: 3.1250 mg | ORAL_TABLET | Freq: Two times a day (BID) | ORAL | 4 refills | Status: DC

## 2018-03-14 ENCOUNTER — Other Ambulatory Visit: Payer: Self-pay | Admitting: Family Medicine

## 2018-03-21 ENCOUNTER — Telehealth: Payer: Self-pay | Admitting: Family Medicine

## 2018-03-21 ENCOUNTER — Other Ambulatory Visit: Payer: Self-pay | Admitting: Emergency Medicine

## 2018-03-21 ENCOUNTER — Other Ambulatory Visit: Payer: Self-pay | Admitting: Family Medicine

## 2018-03-21 DIAGNOSIS — E785 Hyperlipidemia, unspecified: Secondary | ICD-10-CM

## 2018-03-21 MED ORDER — ATORVASTATIN CALCIUM 40 MG PO TABS: 40.0000 mg | ORAL_TABLET | Freq: Every day | ORAL | 0 refills | Status: DC

## 2018-03-21 MED ORDER — ATORVASTATIN CALCIUM 40 MG PO TABS: 40.0000 mg | ORAL_TABLET | Freq: Every day | ORAL | 1 refills | Status: DC

## 2018-03-21 NOTE — Telephone Encounter (Signed)
Advised patient per PCP to continue current medications. Advised that the interaction is low. [facility] then [facility]. He is agreeable.

## 2018-03-21 NOTE — Telephone Encounter (Signed)
Spoke with patient pharmacy [facility] to verify which medication is an interaction with the Atorvastatin. Interaction with Amlodipine and Vytorin with the Atorvastatin above 20 mg can increase myopathy effect and rhabdomyolysis.  Please advise

## 2018-03-21 NOTE — Telephone Encounter (Signed)
Copied from [place] [phone]. Topic: Quick Communication - See Telephone Encounter >> Mar 21, 2018 11:45 AM Neva Seat wrote: Pt is being told there is an interaction between the atorvastatin (LIPITOR) 40 MG tablet.  [facility] is the same type of medication.

## 2018-03-21 NOTE — Telephone Encounter (Signed)
No changes at this time.

## 2018-05-11 ENCOUNTER — Other Ambulatory Visit: Payer: Self-pay | Admitting: Family Medicine

## 2018-05-11 DIAGNOSIS — E785 Hyperlipidemia, unspecified: Secondary | ICD-10-CM

## 2018-06-02 ENCOUNTER — Other Ambulatory Visit: Payer: Self-pay | Admitting: Family Medicine

## 2018-06-02 ENCOUNTER — Telehealth: Payer: Self-pay | Admitting: General Practice

## 2018-06-02 DIAGNOSIS — E785 Hyperlipidemia, unspecified: Secondary | ICD-10-CM

## 2018-06-02 NOTE — Telephone Encounter (Signed)
Please advise?   Copied from [place] [phone]. Topic: Quick Communication - See Telephone Encounter >> Jun 02, 2018  3:13 PM Hewitt Shorts wrote: Pt got a call from the pharmacy stating that he is taking both vytorin and atorvastatin and he needs to stop taking vytorin and the patient is wanting to  Talk with the provider about taking both or stopping one   Best [phone]

## 2018-06-02 NOTE — Telephone Encounter (Signed)
Medication filled to pharmacy as requested.   

## 2018-06-02 NOTE — Telephone Encounter (Signed)
Called pt he advised that per Dr. Birdie Riddle he has been on this medication forever. Advised pt [facility].

## 2018-06-02 NOTE — Telephone Encounter (Signed)
Please clarify w/ pt how he is taking medication and which ones he is taking.  We have Vytorin as M/W/F and Atorvastatin daily.  I am happy to stop 1 of them, but not both.

## 2018-06-04 MED ORDER — EZETIMIBE-SIMVASTATIN 10-40 MG PO TABS: ORAL_TABLET | ORAL | 1 refills | Status: DC

## 2018-06-04 MED ORDER — ATORVASTATIN CALCIUM 40 MG PO TABS: 40.0000 mg | ORAL_TABLET | Freq: Every day | ORAL | 0 refills | Status: DC

## 2018-06-04 NOTE — Telephone Encounter (Signed)
Spoke with pt pharmacy. Just need to add note on both prescriptions that he needs to be on both vytorin and atorvastatin.   [facility]. New rx;s were sent in with note to hold until requested and the note that he is to be on both meds per PCP/Cardiology.

## 2018-06-04 NOTE — Addendum Note (Signed)
Addended by: Davis Gourd on: 06/04/2018 11:36 AM   Modules accepted: Orders

## 2018-06-06 DIAGNOSIS — Z23 Encounter for immunization: Secondary | ICD-10-CM | POA: Diagnosis not present

## 2018-06-06 NOTE — Telephone Encounter (Signed)
Spoke with patient to let him know that the notes were sent to the pharmacy.  Patient stated verbal understanding and said that he would call them.

## 2018-06-06 NOTE — Telephone Encounter (Signed)
Patient calling and would like an update on this situation. Please advise.

## 2018-06-24 ENCOUNTER — Other Ambulatory Visit: Payer: Self-pay | Admitting: Family Medicine

## 2018-07-01 ENCOUNTER — Other Ambulatory Visit: Payer: Self-pay | Admitting: Family Medicine

## 2018-07-09 ENCOUNTER — Ambulatory Visit: Payer: Private Health Insurance - Indemnity

## 2018-08-02 ENCOUNTER — Other Ambulatory Visit: Payer: Self-pay | Admitting: Cardiology

## 2018-08-31 ENCOUNTER — Other Ambulatory Visit: Payer: Self-pay | Admitting: Cardiology

## 2018-08-31 ENCOUNTER — Other Ambulatory Visit: Payer: Self-pay | Admitting: Family Medicine

## 2018-09-01 ENCOUNTER — Telehealth: Payer: Self-pay

## 2018-09-01 NOTE — Telephone Encounter (Signed)
Pt is a 73 yr old male who saw Dr. Lennie Odor on 08/28/17, has a pending appt on 09/02/18. Last noted wt was 100.3Kg on 03/07/18, SCr on 12/09/17 was 1.00, will refill Eliquis 5mg  BID.

## 2018-09-01 NOTE — Telephone Encounter (Signed)
**Note De-Identified Nazia Rhines Obfuscation** We received a PA form Gracelyn Coventry fax from [place] concerning the pts Eliquis. I have completed the form and placed it in Dr [employer] mail bin awaiting his signature.

## 2018-09-02 ENCOUNTER — Ambulatory Visit (INDEPENDENT_AMBULATORY_CARE_PROVIDER_SITE_OTHER): Payer: Medicare Other | Admitting: Cardiology

## 2018-09-02 ENCOUNTER — Encounter: Payer: Self-pay | Admitting: Cardiology

## 2018-09-02 VITALS — BP 152/82 | HR 57 | Ht 71.0 in | Wt 224.0 lb

## 2018-09-02 DIAGNOSIS — I10 Essential (primary) hypertension: Secondary | ICD-10-CM | POA: Diagnosis not present

## 2018-09-02 DIAGNOSIS — I480 Paroxysmal atrial fibrillation: Secondary | ICD-10-CM

## 2018-09-02 NOTE — Telephone Encounter (Signed)
**Note De-Identified Cheri Ayotte Obfuscation** Dr Curt Bears has signed the PA request and I have faxed it back to [facility].

## 2018-09-02 NOTE — Progress Notes (Signed)
Electrophysiology Office Note   Date:  09/02/2018   ID:  Stephen, Morrison 12/09/1945, MRN [id number]  PCP:  [name], MD  Primary Electrophysiologist:  Constance Haw, MD    No chief complaint on file.    History of Present Illness: Stephen Morrison is a 73 y.o. male who presents today for electrophysiology evaluation.   He has a history of HTn, HLD, and AF.  He presented to the ER on 3/25 with one hour of palpitations.  At that time, the palpitations woke him from sleep.  The patient was cardioverted out of AF in the ER.  He was started on Eliquis at that time and told to follow up in clinic but was switched to Xarelto in the interim.    Today, denies symptoms of palpitations, chest pain, shortness of breath, orthopnea, PND, lower extremity edema, claudication, dizziness, presyncope, syncope, bleeding, or neurologic sequela. The patient is tolerating medications without difficulties.  Overall he is doing well.  He is noted no further episodes of atrial fibrillation.  He is able to do all of his daily activities without restriction.   Past Medical History:  Diagnosis Date  . Allergy   . Arthritis   . Atrial fibrillation ([place])    one time incident, on medication now with no further problems  . BPH (benign prostatic hyperplasia)   . Diverticulosis of colon   . GERD (gastroesophageal reflux disease)   . Gout    STABLE PER PT ON 05-12-2014  . Hyperlipidemia   . Hypertension   . Hypothyroidism   . PSA elevation   . Tick bite 02/25/2016  . Urinary retention    Past Surgical History:  Procedure Laterality Date  . COLONOSCOPY  01-31-2007  . EXCISION BENIGN CYST POSTERIOR NECK  2010  . INSERTION OF SUPRAPUBIC CATHETER N/A 05/17/2014   Procedure: INSERTION OF SUPRAPUBIC CATHETER;  Surgeon: Bernestine Amass, MD;  Location: [facility];  Service: Urology;  Laterality: N/A;  . ORIF RIGHT WRIST FX  2009  . TRANSURETHRAL RESECTION OF PROSTATE N/A 05/17/2014   Procedure: TRANSURETHRAL RESECTION OF THE PROSTATE WITH GYRUS INSTRUMENTS;  Surgeon: Bernestine Amass, MD;  Location[facility];  Service: Urology;  Laterality: N/A;  . VASECTOMY       Current Outpatient Medications  Medication Sig Dispense Refill  . amLODipine (NORVASC) 10 MG tablet Take 1 tablet (10 mg total) by mouth daily. 90 tablet 1  . atorvastatin (LIPITOR) 40 MG tablet Take 1 tablet (40 mg total) by mouth daily. 90 tablet 0  . carvedilol (COREG) 3.125 MG tablet Take 1 tablet (3.125 mg total) by mouth 2 (two) times daily. Please keep upcoming appt with Dr. Curt Bears in January. Thank you 180 tablet 0  . cetirizine (ZYRTEC) 10 MG tablet Take 10 mg by mouth as needed for allergies.     Marland Kitchen colchicine 0.6 MG tablet Take 1 tablet (0.6 mg total) by mouth 2 (two) times daily as needed (pain). Reported on 10/03/2015 30 tablet 3  . ELIQUIS 5 MG TABS tablet TAKE 1 TABLET TWICE A DAY 180 tablet 1  . ezetimibe-simvastatin (VYTORIN) 10-40 MG tablet TAKE 1 TABLET MONDAY, WEDNESDAY, AND FRIDAY 39 tablet 1  . flecainide (TAMBOCOR) 50 MG tablet Take 1.5 tablets (75 mg total) by mouth 2 (two) times daily. 270 tablet 3  . furosemide (LASIX) 20 MG tablet TAKE 1 TABLET DAILY 90 tablet 1  . gabapentin (NEURONTIN) 300 MG capsule TAKE 1  CAPSULE AT BEDTIME 90 capsule 1  . hydrALAZINE (APRESOLINE) 10 MG tablet TAKE 1 TABLET TWICE A DAY 180 tablet 1  . levothyroxine (SYNTHROID, LEVOTHROID) 50 MCG tablet TAKE 1 TABLET DAILY 90 tablet 1  . methocarbamol (ROBAXIN) 500 MG tablet Take 1 tablet (500 mg total) by mouth every 8 (eight) hours as needed for muscle spasms. 15 tablet 0  . omeprazole (PRILOSEC) 20 MG capsule Take 20 mg by mouth daily as needed (acid reflux).    . valsartan-hydrochlorothiazide (DIOVAN-HCT) 320-12.5 MG tablet TAKE 1 TABLET DAILY 90 tablet 1   No current facility-administered medications for this visit.     Allergies:   Amoxicillin   Social History:  The patient  reports that he  quit smoking about 45 years ago. His smoking use included cigarettes. He has a 10.00 pack-year smoking history. He quit smokeless tobacco use about 44 years ago.  His smokeless tobacco use included snuff and chew. He reports current alcohol use. He reports that he does not use drugs.   Family History:  The patient's family history includes Alcohol abuse in his father; Dementia in his father; Esophageal varices in his father; Hyperlipidemia in his mother; Hypertension in his mother.    ROS:  Please see the history of present illness.   Otherwise, review of systems is positive for back pain, easy bruising.   All other systems are reviewed and negative.   PHYSICAL EXAM: VS:  BP (!) 152/82   Pulse (!) 57   Ht 5\' 11"  (1.803 m)   Wt 224 lb (101.6 kg)   BMI 31.24 kg/m  , BMI Body mass index is 31.24 kg/m. GEN: Well nourished, well developed, in no acute distress  HEENT: normal  Neck: no JVD, carotid bruits, or masses Cardiac: RRR; no murmurs, rubs, or gallops,no edema  Respiratory:  clear to auscultation bilaterally, normal work of breathing GI: soft, nontender, nondistended, + BS MS: no deformity or atrophy  Skin: warm and dry Neuro:  Strength and sensation are intact Psych: euthymic mood, full affect  EKG:  EKG is ordered today. Personal review of the ekg ordered shows sinus rhythm, rate 57  Recent Labs: 12/09/2017: ALT 29; BUN 14; Creatinine, Ser 1.00; Hemoglobin 16.9; Platelets 327.0; Potassium 3.7; Sodium 139; TSH 4.03    Lipid Panel     Component Value Date/Time   CHOL 153 12/09/2017 0920   TRIG 158.0 (H) 12/09/2017 0920   HDL 32.70 (L) 12/09/2017 0920   CHOLHDL 5 12/09/2017 0920   VLDL 31.6 12/09/2017 0920   LDLCALC 89 12/09/2017 0920   LDLDIRECT 123.3 06/01/2013 1237     Wt Readings from Last 3 Encounters:  09/02/18 224 lb (101.6 kg)  03/07/18 221 lb 3.2 oz (100.3 kg)  12/09/17 223 lb 6 oz (101.3 kg)      Other studies Reviewed: Additional studies/ records that  were reviewed today include:  TTE 12/09/15 - Left ventricle: The cavity size was normal. Wall thickness was  increased in a pattern of mild LVH. Systolic function was normal.  The estimated ejection fraction was in the range of 60% to 65%.  Wall motion was normal; there were no regional wall motion  abnormalities.    ASSESSMENT AND PLAN:  1.  Atrial fibrillation: Currently on Xarelto, Coreg, and flecainide.  He is noted no further episodes of atrial fibrillation.  We Raiana Pharris continue with current management.  This patients CHA2DS2-VASc Score and unadjusted Ischemic Stroke Rate (% per year) is equal to 2.2 % stroke rate/year  from a score of 2  Above score calculated as 1 point each if present [CHF, HTN, DM, Vascular=MI/PAD/Aortic Plaque, Age if 65-74, or Male] Above score calculated as 2 points each if present [Age > 75, or Stroke/TIA/TE]   2. Hypertension: Elevated today but he says that he is not taking his blood pressure medicines yet.  We Diante Barley take him when he gets home.  We Ginelle Bays make no changes at this time.  Plan per primary physician.  Current medicines are reviewed at length with the patient today.   The patient does not have concerns regarding his medicines.  The following changes were made today: None  Labs/ tests ordered today include:  Orders Placed This Encounter  Procedures  . EKG 12-Lead     Disposition:   FU with Austine Kelsay 6 months  Signed, Lasheka Kempner Meredith Leeds, MD  09/02/2018 8:12 AM     [facility] [address] [place] [address] [phone] (office) [phone] (fax)

## 2018-09-02 NOTE — Patient Instructions (Signed)
Medication Instructions:  Your physician recommends that you continue on your current medications as directed. Please refer to the Current Medication list given to you today.  If you need a refill on your cardiac medications before your next appointment, please call your pharmacy.   Labwork: None ordered  Testing/Procedures: None ordered  Follow-Up: Your physician wants you to follow-up in: 6 months with Dr. Camnitz.  You will receive a reminder letter in the mail two months in advance. If you don't receive a letter, please call our office to schedule the follow-up appointment.  Thank you for choosing [facility]!!   Kyrene Longan, RN [phone]         

## 2018-09-03 NOTE — Telephone Encounter (Signed)
Received notification that Eliquis 5mg  is approved effective 08/03/2018 through 09/02/2019

## 2018-09-14 ENCOUNTER — Other Ambulatory Visit: Payer: Self-pay | Admitting: Family Medicine

## 2018-09-30 ENCOUNTER — Other Ambulatory Visit: Payer: Self-pay | Admitting: Cardiology

## 2018-10-01 ENCOUNTER — Other Ambulatory Visit: Payer: Self-pay | Admitting: Cardiology

## 2018-10-01 MED ORDER — CARVEDILOL 3.125 MG PO TABS: 3.1250 mg | ORAL_TABLET | Freq: Two times a day (BID) | ORAL | 3 refills | Status: DC

## 2018-10-01 NOTE — Telephone Encounter (Signed)
Pt's medication was sent to pt's pharmacy as requested. Confirmation received.  °

## 2018-10-19 ENCOUNTER — Other Ambulatory Visit: Payer: Self-pay | Admitting: Family Medicine

## 2018-10-19 DIAGNOSIS — E785 Hyperlipidemia, unspecified: Secondary | ICD-10-CM

## 2018-12-01 ENCOUNTER — Other Ambulatory Visit: Payer: Self-pay | Admitting: Family Medicine

## 2018-12-26 ENCOUNTER — Other Ambulatory Visit: Payer: Self-pay | Admitting: Family Medicine

## 2019-03-10 ENCOUNTER — Other Ambulatory Visit: Payer: Self-pay | Admitting: Family Medicine

## 2019-03-10 ENCOUNTER — Other Ambulatory Visit: Payer: Self-pay | Admitting: Cardiology

## 2019-03-10 DIAGNOSIS — I480 Paroxysmal atrial fibrillation: Secondary | ICD-10-CM

## 2019-03-10 DIAGNOSIS — I4891 Unspecified atrial fibrillation: Secondary | ICD-10-CM

## 2019-03-10 NOTE — Telephone Encounter (Signed)
Prescription refill request for Eliquis received.  Last office visit: Dr. Curt Bears (09-02-2018) Scr: 1.0 (12-09-2017) Age: 73 y.o. Weight: 101.6 kg   Pt is overdue for blood work. Called pt and scheduled an appointment to get labs. Pt stated he has enough Eliquis to last him at least a week and a half to two.

## 2019-03-12 ENCOUNTER — Other Ambulatory Visit: Payer: Self-pay

## 2019-03-13 ENCOUNTER — Other Ambulatory Visit: Payer: Medicare Other | Admitting: *Deleted

## 2019-03-13 ENCOUNTER — Other Ambulatory Visit: Payer: Self-pay

## 2019-03-13 DIAGNOSIS — I480 Paroxysmal atrial fibrillation: Secondary | ICD-10-CM

## 2019-03-13 LAB — BASIC METABOLIC PANEL
BUN/Creatinine Ratio: 16 (ref 10–24)
BUN: 16 mg/dL (ref 8–27)
CO2: 29 mmol/L (ref 20–29)
Calcium: 9.2 mg/dL (ref 8.6–10.2)
Chloride: 101 mmol/L (ref 96–106)
Creatinine, Ser: 0.97 mg/dL (ref 0.76–1.27)
GFR calc Af Amer: 89 mL/min/{1.73_m2} (ref >59)
GFR calc non Af Amer: 77 mL/min/{1.73_m2} (ref >59)
Glucose: 111 mg/dL — ABNORMAL HIGH (ref 65–99)
Potassium: 3.7 mmol/L (ref 3.5–5.2)
Sodium: 138 mmol/L (ref 134–144)

## 2019-03-13 LAB — CBC
Hematocrit: 45.9 % (ref 37.5–51.0)
Hemoglobin: 15.9 g/dL (ref 13.0–17.7)
MCH: 30.3 pg (ref 26.6–33.0)
MCHC: 34.6 g/dL (ref 31.5–35.7)
MCV: 87 fL (ref 79–97)
Platelets: 356 10*3/uL (ref 150–450)
RBC: 5.25 x10E6/uL (ref 4.14–5.80)
RDW: 13.6 % (ref 11.6–15.4)
WBC: 7.9 10*3/uL (ref 3.4–10.8)

## 2019-03-13 NOTE — Telephone Encounter (Signed)
Prescription refill request for Eliquis received.  Last office visit: Camnitz (09-02-2018) Scr: 0.97 (03-13-2019) Age: 73 y.o. Weight: 101.6 kg  Prescription refill sent.

## 2019-04-14 DIAGNOSIS — Z23 Encounter for immunization: Secondary | ICD-10-CM | POA: Diagnosis not present

## 2019-04-21 ENCOUNTER — Encounter: Payer: Self-pay | Admitting: Family Medicine

## 2019-04-21 DIAGNOSIS — M545 Low back pain, unspecified: Secondary | ICD-10-CM

## 2019-04-22 MED ORDER — METHOCARBAMOL 500 MG PO TABS: 500.0000 mg | ORAL_TABLET | Freq: Three times a day (TID) | ORAL | 0 refills | Status: DC | PRN

## 2019-07-08 ENCOUNTER — Other Ambulatory Visit: Payer: Self-pay

## 2019-08-29 ENCOUNTER — Encounter: Payer: Self-pay | Admitting: Family Medicine

## 2019-09-01 ENCOUNTER — Ambulatory Visit (INDEPENDENT_AMBULATORY_CARE_PROVIDER_SITE_OTHER): Payer: Medicare Other | Admitting: Physician Assistant

## 2019-09-01 ENCOUNTER — Other Ambulatory Visit: Payer: Self-pay

## 2019-09-01 ENCOUNTER — Encounter: Payer: Self-pay | Admitting: Physician Assistant

## 2019-09-01 DIAGNOSIS — M545 Low back pain, unspecified: Secondary | ICD-10-CM

## 2019-09-01 MED ORDER — METHOCARBAMOL 500 MG PO TABS: 500.0000 mg | ORAL_TABLET | Freq: Three times a day (TID) | ORAL | 0 refills | Status: DC | PRN

## 2019-09-01 NOTE — Telephone Encounter (Signed)
Pt refused virtual visit, passed screening and scheduled with [facility] 09/01/2019

## 2019-09-01 NOTE — Progress Notes (Signed)
Patient presents to clinic today c/o 1 week of left-sided lower back pain after he noted pulling muscle when getting out of his truck.  Denies radiation down into the lower extremity.  Denies numbness, tingling or weakness.  Has history of lumbar back pain with occasional flare.  Has taken some ibuprofen despite being on Eliquis.  Notes this has help with symptoms.  States he is getting better but did some yard work yesterday and notes slight increase in pain today.   Past Medical History:  Diagnosis Date  . Allergy   . Arthritis   . Atrial fibrillation ([place])    one time incident, on medication now with no further problems  . BPH (benign prostatic hyperplasia)   . Diverticulosis of colon   . GERD (gastroesophageal reflux disease)   . Gout    STABLE PER PT ON 05-12-2014  . Hyperlipidemia   . Hypertension   . Hypothyroidism   . PSA elevation   . Tick bite 02/25/2016  . Urinary retention     Current Outpatient Medications on File Prior to Visit  Medication Sig Dispense Refill  . amLODipine (NORVASC) 10 MG tablet TAKE 1 TABLET DAILY 90 tablet 3  . atorvastatin (LIPITOR) 40 MG tablet TAKE 1 TABLET DAILY 90 tablet 3  . carvedilol (COREG) 3.125 MG tablet Take 1 tablet (3.125 mg total) by mouth 2 (two) times daily. 180 tablet 3  . cetirizine (ZYRTEC) 10 MG tablet Take 10 mg by mouth as needed for allergies.     Marland Kitchen colchicine 0.6 MG tablet Take 1 tablet (0.6 mg total) by mouth 2 (two) times daily as needed (pain). Reported on 10/03/2015 30 tablet 3  . ELIQUIS 5 MG TABS tablet TAKE 1 TABLET TWICE A DAY 180 tablet 1  . ezetimibe-simvastatin (VYTORIN) 10-40 MG tablet TAKE 1 TABLET MONDAY, WEDNESDAY, AND FRIDAY 39 tablet 3  . flecainide (TAMBOCOR) 50 MG tablet TAKE ONE AND ONE-HALF TABLETS TWICE A DAY 270 tablet 3  . furosemide (LASIX) 20 MG tablet TAKE 1 TABLET DAILY 90 tablet 3  . gabapentin (NEURONTIN) 300 MG capsule TAKE 1 CAPSULE AT BEDTIME 90 capsule 1  . hydrALAZINE (APRESOLINE) 10 MG  tablet TAKE 1 TABLET TWICE A DAY 180 tablet 1  . levothyroxine (SYNTHROID) 50 MCG tablet TAKE 1 TABLET DAILY 90 tablet 3  . methocarbamol (ROBAXIN) 500 MG tablet Take 1 tablet (500 mg total) by mouth every 8 (eight) hours as needed for muscle spasms. 15 tablet 0  . omeprazole (PRILOSEC) 20 MG capsule Take 20 mg by mouth daily as needed (acid reflux).    . valsartan-hydrochlorothiazide (DIOVAN-HCT) 320-12.5 MG tablet TAKE 1 TABLET DAILY 90 tablet 1   No current facility-administered medications on file prior to visit.      Family History  Problem Relation Age of Onset  . Alcohol abuse Father   . Dementia Father   . Esophageal varices Father   . Hyperlipidemia Mother   . Hypertension Mother   . Colon cancer Neg Hx   . Esophageal cancer Neg Hx   . Stomach cancer Neg Hx   . Rectal cancer Neg Hx     Social History   Socioeconomic History  . Marital status: Married    Spouse name: Not on file  . Number of children: Not on file  . Years of education: Not on file  . Highest education level: Not on file  Occupational History  . Not on file  Tobacco Use  . Smoking status: Former  Smoker    Packs/day: 1.00    Years: 10.00    Pack years: 10.00    Types: Cigarettes    Quit date: 08/13/1973    Years since quitting: 46.0  . Smokeless tobacco: Former Systems developer    Types: Snuff, Sarina Ser    Quit date: 05/12/1974  Substance and Sexual Activity  . Alcohol use: Yes    Alcohol/week: 0.0 standard drinks    Comment: OCCASIONALLY  . Drug use: No  . Sexual activity: Not on file  Other Topics Concern  . Not on file  Social History Narrative  . Not on file   Social Determinants of Health   Financial Resource Strain:   . Difficulty of Paying Living Expenses: Not on file  Food Insecurity:   . Worried About Charity fundraiser in the Last Year: Not on file  . Ran Out of Food in the Last Year: Not on file  Transportation Needs:   . Lack of Transportation (Medical): Not on file  . Lack of  Transportation (Non-Medical): Not on file  Physical Activity:   . Days of Exercise per Week: Not on file  . Minutes of Exercise per Session: Not on file  Stress:   . Feeling of Stress : Not on file  Social Connections:   . Frequency of Communication with Friends and Family: Not on file  . Frequency of Social Gatherings with Friends and Family: Not on file  . Attends Religious Services: Not on file  . Active Member of Clubs or Organizations: Not on file  . Attends Archivist Meetings: Not on file  . Marital Status: Not on file   Review of Systems - See HPI.  All other ROS are negative.  Wt 224 lb 3.2 oz (101.7 kg)   BMI 31.27 kg/m   Physical Exam Vitals reviewed.  Constitutional:      Appearance: Normal appearance.  HENT:     Head: Normocephalic and atraumatic.  Pulmonary:     Effort: Pulmonary effort is normal.  Musculoskeletal:        General: Normal range of motion.     Cervical back: Normal and neck supple.     Thoracic back: Normal.     Lumbar back: Spasms and tenderness present. No bony tenderness. Normal range of motion.  Neurological:     Mental Status: He is alert.    Assessment/Plan: 1. Acute bilateral low back pain without sciatica Patient on anticoagulant so NSAIDs need to be avoided.  He was counseled on this and reminded to avoid Advil.  Supportive measures reviewed with patient.  Heating pad recommended.  Rx Robaxin as he is taking this before with good improvement and tolerates well.  Follow-up if symptoms not resolving. - methocarbamol (ROBAXIN) 500 MG tablet; Take 1 tablet (500 mg total) by mouth every 8 (eight) hours as needed for muscle spasms.  Dispense: 15 tablet; Refill: 0  This visit occurred during the SARS-CoV-2 public health emergency.  Safety protocols were in place, including screening questions prior to the visit, additional usage of staff PPE, and extensive cleaning of exam room while observing appropriate contact time as indicated for  disinfecting solutions.     Leeanne Rio, PA-C

## 2019-09-01 NOTE — Patient Instructions (Signed)
-  Please avoid heavy lifting and overexertion. -Tylenol for pain.  -Take the Robaxin no more than as directed, but at least take in the evening to relax muscles. -Apply heating pad to the area for 10-15 minutes a few times per week.  -Let us know if symptoms are not resolving.

## 2019-09-10 ENCOUNTER — Encounter: Payer: Self-pay | Admitting: Family Medicine

## 2019-09-10 ENCOUNTER — Other Ambulatory Visit: Payer: Self-pay

## 2019-09-10 MED ORDER — APIXABAN 5 MG PO TABS: 5.0000 mg | ORAL_TABLET | Freq: Two times a day (BID) | ORAL | 0 refills | Status: DC

## 2019-09-10 NOTE — Telephone Encounter (Signed)
Pt last saw Dr Curt Bears 09/02/18, overdue for follow-up, last labs 03/13/19 Creat 0.97, age 74, weight 101.7kg, based on specified criteria pt is on appropriate dosage of Eliquis 5mg  BID.  Pt sent message via My Chart that he needed an Eliquis rx sent to [facility].  Will refill x 1 90 day supply and send message back to pt that he is overdue for follow-up and will need to see Dr Curt Bears prior to future refills.

## 2019-09-16 ENCOUNTER — Ambulatory Visit (INDEPENDENT_AMBULATORY_CARE_PROVIDER_SITE_OTHER): Payer: Medicare Other | Admitting: Family Medicine

## 2019-09-16 ENCOUNTER — Encounter: Payer: Self-pay | Admitting: Family Medicine

## 2019-09-16 ENCOUNTER — Other Ambulatory Visit: Payer: Self-pay

## 2019-09-16 VITALS — BP 147/81 | HR 67 | Ht 70.0 in | Wt 224.0 lb

## 2019-09-16 DIAGNOSIS — I10 Essential (primary) hypertension: Secondary | ICD-10-CM | POA: Diagnosis not present

## 2019-09-16 DIAGNOSIS — E038 Other specified hypothyroidism: Secondary | ICD-10-CM

## 2019-09-16 DIAGNOSIS — E785 Hyperlipidemia, unspecified: Secondary | ICD-10-CM

## 2019-09-16 NOTE — Progress Notes (Signed)
I have discussed the procedure for the virtual visit with the patient who has given consent to proceed with assessment and treatment.   Stephen Morrison L [name], CMA     

## 2019-09-16 NOTE — Progress Notes (Signed)
Virtual Visit via Video   I connected with patient on 09/16/19 at 10:30 AM EST by a video enabled telemedicine application and verified that I am speaking with the correct person using two identifiers.  Location patient: Home Location provider: Acupuncturist, Office Persons participating in the virtual visit: Patient, Provider, [place] (Jess B)  I discussed the limitations of evaluation and management by telemedicine and the availability of in person appointments. The patient expressed understanding and agreed to proceed.  Interactive audio and video telecommunications were attempted between this provider and patient, however failed, due to patient having technical difficulties OR patient did not have access to video capability.  We continued and completed visit with audio only.   Subjective:   HPI:   HTN- chronic problem, on Amlodipine 10mg  daily, Coreg 3.125mg  BID, Hydralazine 10mg  BID, Valsartan HCTZ 320/12.5mg  daily.  Pt states BP this AM was just after medication.  Pt reports home BPs are 120-130s/70s.  No CP, SOB, HAs, visual changes, edema.  Pt stays active w/ golf weather permitting.  Hyperlipidemia- chronic problem, currently on Lipitor 40mg  daily and Vytorin 10/40mg  Mon/Wed/Fri.  No abd pain, N/V  Hypothyroid- chronic problem, on Levothyroxine 53mcg daily.  Pt reports energy level is 'pretty good'.  No changes to skin/hair/nails.  ROS:   See pertinent positives and negatives per HPI.  Patient Active Problem List   Diagnosis Date Noted  . Urinary retention   . PSA elevation   . GERD (gastroesophageal reflux disease)   . Diverticulosis of colon   . BPH (benign prostatic hyperplasia)   . Atrial fibrillation ([place])   . Arthritis   . Allergy   . Colon cancer screening 05/29/2017  . Chronic anticoagulation 05/29/2017  . Lower leg edema 12/28/2016  . Tick bite 02/25/2016  . Erectile dysfunction 10/04/2015  . Physical exam 03/31/2015  . Benign prostatic hyperplasia  with urinary retention 05/17/2014  . Urinary retention due to benign prostatic hyperplasia 05/12/2014  . Osteoarthritis 04/09/2014  . LIPOMA 03/06/2010  . FATTY LIVER DISEASE 03/06/2010  . Hypothyroidism 10/25/2008  . PROSTATE SPECIFIC ANTIGEN, ELEVATED 03/21/2007  . Hyperlipidemia 02/03/2007  . GOUT 02/03/2007  . Essential hypertension 02/03/2007    Social History   Tobacco Use  . Smoking status: Former Smoker    Packs/day: 1.00    Years: 10.00    Pack years: 10.00    Types: Cigarettes    Quit date: 08/13/1973    Years since quitting: 46.1  . Smokeless tobacco: Former Systems developer    Types: Snuff, Sarina Ser    Quit date: 05/12/1974  Substance Use Topics  . Alcohol use: Yes    Alcohol/week: 0.0 standard drinks    Comment: OCCASIONALLY    Current Outpatient Medications:  .  amLODipine (NORVASC) 10 MG tablet, TAKE 1 TABLET DAILY, Disp: 90 tablet, Rfl: 3 .  apixaban (ELIQUIS) 5 MG TABS tablet, Take 1 tablet (5 mg total) by mouth 2 (two) times daily. Pt is Overdue for follow-up with Dr Curt Bears.  Must see MD for future refills., Disp: 180 tablet, Rfl: 0 .  atorvastatin (LIPITOR) 40 MG tablet, TAKE 1 TABLET DAILY, Disp: 90 tablet, Rfl: 3 .  carvedilol (COREG) 3.125 MG tablet, Take 1 tablet (3.125 mg total) by mouth 2 (two) times daily., Disp: 180 tablet, Rfl: 3 .  cetirizine (ZYRTEC) 10 MG tablet, Take 10 mg by mouth as needed for allergies. , Disp: , Rfl:  .  colchicine 0.6 MG tablet, Take 1 tablet (0.6 mg total) by mouth 2 (  two) times daily as needed (pain). Reported on 10/03/2015, Disp: 30 tablet, Rfl: 3 .  ezetimibe-simvastatin (VYTORIN) 10-40 MG tablet, TAKE 1 TABLET MONDAY, WEDNESDAY, AND FRIDAY, Disp: 39 tablet, Rfl: 3 .  flecainide (TAMBOCOR) 50 MG tablet, TAKE ONE AND ONE-HALF TABLETS TWICE A DAY, Disp: 270 tablet, Rfl: 3 .  furosemide (LASIX) 20 MG tablet, TAKE 1 TABLET DAILY, Disp: 90 tablet, Rfl: 3 .  gabapentin (NEURONTIN) 300 MG capsule, TAKE 1 CAPSULE AT BEDTIME, Disp: 90 capsule,  Rfl: 1 .  hydrALAZINE (APRESOLINE) 10 MG tablet, TAKE 1 TABLET TWICE A DAY, Disp: 180 tablet, Rfl: 1 .  levothyroxine (SYNTHROID) 50 MCG tablet, TAKE 1 TABLET DAILY, Disp: 90 tablet, Rfl: 3 .  methocarbamol (ROBAXIN) 500 MG tablet, Take 1 tablet (500 mg total) by mouth every 8 (eight) hours as needed for muscle spasms., Disp: 15 tablet, Rfl: 0 .  omeprazole (PRILOSEC) 20 MG capsule, Take 20 mg by mouth daily as needed (acid reflux)., Disp: , Rfl:  .  valsartan-hydrochlorothiazide (DIOVAN-HCT) 320-12.5 MG tablet, TAKE 1 TABLET DAILY, Disp: 90 tablet, Rfl: 1    Objective:   BP (!) 147/81   Pulse 67   Ht 5\' 10"  (1.778 m)   Wt 224 lb (101.6 kg)   SpO2 92%   BMI 32.14 kg/m   Pt is able to speak clearly, coherently without shortness of breath or increased work of breathing. Thought process is linear.  Mood is appropriate.   Assessment and Plan:   HTN- chronic problem.  BP is mildly elevated today but he took his BP just after taking medication.  He reports home BPs are well controlled and he is asymptomatic.  Check labs.  No anticipated med changes.  Will follow.  Hyperlipidemia- pt is on unconventional medication regimen but it has been working well for him.  Check labs.  Adjust meds prn   Hypothyroid- chronic problem.  Currently asymptomatic.  Check labs.  Adjust meds prn   Annye Asa, MD 09/16/2019  Time spent with the patient: 14 minutes, of which >50% was spent in obtaining information about symptoms, reviewing previous labs, evaluations, and treatments, counseling about condition (please see the discussed topics above), and developing a plan to further investigate it; had a number of questions which I addressed.

## 2019-09-18 ENCOUNTER — Other Ambulatory Visit: Payer: Self-pay | Admitting: General Practice

## 2019-09-18 ENCOUNTER — Other Ambulatory Visit (INDEPENDENT_AMBULATORY_CARE_PROVIDER_SITE_OTHER): Payer: Medicare Other

## 2019-09-18 DIAGNOSIS — I10 Essential (primary) hypertension: Secondary | ICD-10-CM

## 2019-09-18 DIAGNOSIS — E038 Other specified hypothyroidism: Secondary | ICD-10-CM | POA: Diagnosis not present

## 2019-09-18 DIAGNOSIS — E785 Hyperlipidemia, unspecified: Secondary | ICD-10-CM | POA: Diagnosis not present

## 2019-09-18 LAB — CBC WITH DIFFERENTIAL/PLATELET
Basophils Absolute: 0.1 10*3/uL (ref 0.0–0.1)
Basophils Relative: 0.8 % (ref 0.0–3.0)
Eosinophils Absolute: 0.1 10*3/uL (ref 0.0–0.7)
Eosinophils Relative: 1.7 % (ref 0.0–5.0)
HCT: 47.7 % (ref 39.0–52.0)
Hemoglobin: 16.4 g/dL (ref 13.0–17.0)
Lymphocytes Relative: 24.6 % (ref 12.0–46.0)
Lymphs Abs: 2.1 10*3/uL (ref 0.7–4.0)
MCHC: 34.3 g/dL (ref 30.0–36.0)
MCV: 88.7 fl (ref 78.0–100.0)
Monocytes Absolute: 1 10*3/uL (ref 0.1–1.0)
Monocytes Relative: 11.9 % (ref 3.0–12.0)
Neutro Abs: 5.1 10*3/uL (ref 1.4–7.7)
Neutrophils Relative %: 61 % (ref 43.0–77.0)
Platelets: 354 10*3/uL (ref 150.0–400.0)
RBC: 5.38 Mil/uL (ref 4.22–5.81)
RDW: 13.3 % (ref 11.5–15.5)
WBC: 8.3 10*3/uL (ref 4.0–10.5)

## 2019-09-18 LAB — BASIC METABOLIC PANEL
BUN: 16 mg/dL (ref 6–23)
CO2: 32 mEq/L (ref 19–32)
Calcium: 9.1 mg/dL (ref 8.4–10.5)
Chloride: 97 mEq/L (ref 96–112)
Creatinine, Ser: 1.11 mg/dL (ref 0.40–1.50)
GFR: 64.53 mL/min (ref >60.00)
Glucose, Bld: 89 mg/dL (ref 70–99)
Potassium: 3.6 mEq/L (ref 3.5–5.1)
Sodium: 138 mEq/L (ref 135–145)

## 2019-09-18 LAB — HEPATIC FUNCTION PANEL
ALT: 28 U/L (ref 0–53)
AST: 22 U/L (ref 0–37)
Albumin: 4.3 g/dL (ref 3.5–5.2)
Alkaline Phosphatase: 66 U/L (ref 39–117)
Bilirubin, Direct: 0.2 mg/dL (ref 0.0–0.3)
Total Bilirubin: 0.9 mg/dL (ref 0.2–1.2)
Total Protein: 7.5 g/dL (ref 6.0–8.3)

## 2019-09-18 LAB — LIPID PANEL
Cholesterol: 109 mg/dL (ref 0–200)
HDL: 29.9 mg/dL — ABNORMAL LOW (ref >39.00)
LDL Cholesterol: 42 mg/dL (ref 0–99)
NonHDL: 79.1
Total CHOL/HDL Ratio: 4
Triglycerides: 184 mg/dL — ABNORMAL HIGH (ref 0.0–149.0)
VLDL: 36.8 mg/dL (ref 0.0–40.0)

## 2019-09-18 LAB — TSH: TSH: 4.16 u[IU]/mL (ref 0.35–4.50)

## 2019-09-18 MED ORDER — VALSARTAN-HYDROCHLOROTHIAZIDE 320-12.5 MG PO TABS: 1.0000 | ORAL_TABLET | Freq: Every day | ORAL | 1 refills | Status: DC

## 2019-09-18 MED ORDER — ATORVASTATIN CALCIUM 40 MG PO TABS: 40.0000 mg | ORAL_TABLET | Freq: Every day | ORAL | 1 refills | Status: DC

## 2019-09-18 MED ORDER — HYDRALAZINE HCL 10 MG PO TABS: 10.0000 mg | ORAL_TABLET | Freq: Two times a day (BID) | ORAL | 1 refills | Status: DC

## 2019-09-18 MED ORDER — FUROSEMIDE 20 MG PO TABS: 20.0000 mg | ORAL_TABLET | Freq: Every day | ORAL | 1 refills | Status: DC

## 2019-09-18 MED ORDER — LEVOTHYROXINE SODIUM 50 MCG PO TABS: 50.0000 ug | ORAL_TABLET | Freq: Every day | ORAL | 1 refills | Status: DC

## 2019-09-28 ENCOUNTER — Ambulatory Visit: Payer: Medicare Other | Admitting: Cardiology

## 2019-09-28 ENCOUNTER — Encounter: Payer: Self-pay | Admitting: Cardiology

## 2019-09-28 ENCOUNTER — Other Ambulatory Visit: Payer: Self-pay

## 2019-09-28 VITALS — BP 166/80 | HR 57 | Ht 70.0 in | Wt 223.0 lb

## 2019-09-28 DIAGNOSIS — I480 Paroxysmal atrial fibrillation: Secondary | ICD-10-CM | POA: Diagnosis not present

## 2019-09-28 NOTE — Progress Notes (Signed)
Electrophysiology Office Note   Date:  09/28/2019   ID:  Stephen, Morrison 1946/05/13, MRN [id number]  PCP:  [name], MD  Primary Electrophysiologist:  Constance Haw, MD    No chief complaint on file.    History of Present Illness: Stephen Morrison is a 74 y.o. male who presents today for electrophysiology evaluation.   He has a history of HTn, HLD, and AF.  He presented to the ER on 3/25 with one hour of palpitations.  At that time, the palpitations woke him from sleep.  The patient was cardioverted out of AF in the ER.  He was started on Eliquis at that time and told to follow up in clinic but was switched to Xarelto in the interim.    Today, denies symptoms of palpitations, chest pain, shortness of breath, orthopnea, PND, lower extremity edema, claudication, dizziness, presyncope, syncope, bleeding, or neurologic sequela. The patient is tolerating medications without difficulties.  Overall he is doing well.  He is noted no further episodes of atrial fibrillation.  Fortunately he has gotten both doses of his Covid vaccine.  He had a mild short shoulder but otherwise felt well.  He has not had any shortness of breath or palpitations.   Past Medical History:  Diagnosis Date  . Allergy   . Arthritis   . Atrial fibrillation ([place])    one time incident, on medication now with no further problems  . BPH (benign prostatic hyperplasia)   . Diverticulosis of colon   . GERD (gastroesophageal reflux disease)   . Gout    STABLE PER PT ON 05-12-2014  . Hyperlipidemia   . Hypertension   . Hypothyroidism   . PSA elevation   . Tick bite 02/25/2016  . Urinary retention    Past Surgical History:  Procedure Laterality Date  . COLONOSCOPY  01-31-2007  . EXCISION BENIGN CYST POSTERIOR NECK  2010  . INSERTION OF SUPRAPUBIC CATHETER N/A 05/17/2014   Procedure: INSERTION OF SUPRAPUBIC CATHETER;  Surgeon: Bernestine Amass, MD;  Location: [facility];  Service: Urology;   Laterality: N/A;  . ORIF RIGHT WRIST FX  2009  . TRANSURETHRAL RESECTION OF PROSTATE N/A 05/17/2014   Procedure: TRANSURETHRAL RESECTION OF THE PROSTATE WITH GYRUS INSTRUMENTS;  Surgeon: Bernestine Amass, MD;  Location[facility];  Service: Urology;  Laterality: N/A;  . VASECTOMY       Current Outpatient Medications  Medication Sig Dispense Refill  . amLODipine (NORVASC) 10 MG tablet TAKE 1 TABLET DAILY 90 tablet 3  . apixaban (ELIQUIS) 5 MG TABS tablet Take 1 tablet (5 mg total) by mouth 2 (two) times daily. Pt is Overdue for follow-up with Dr Curt Bears.  Must see MD for future refills. 180 tablet 0  . atorvastatin (LIPITOR) 40 MG tablet Take 1 tablet (40 mg total) by mouth daily. 90 tablet 1  . carvedilol (COREG) 3.125 MG tablet Take 1 tablet (3.125 mg total) by mouth 2 (two) times daily. 180 tablet 3  . cetirizine (ZYRTEC) 10 MG tablet Take 10 mg by mouth as needed for allergies.     Marland Kitchen colchicine 0.6 MG tablet Take 1 tablet (0.6 mg total) by mouth 2 (two) times daily as needed (pain). Reported on 10/03/2015 30 tablet 3  . ezetimibe-simvastatin (VYTORIN) 10-40 MG tablet TAKE 1 TABLET MONDAY, WEDNESDAY, AND FRIDAY 39 tablet 3  . flecainide (TAMBOCOR) 50 MG tablet TAKE ONE AND ONE-HALF TABLETS TWICE A DAY 270 tablet 3  .  furosemide (LASIX) 20 MG tablet Take 1 tablet (20 mg total) by mouth daily. 90 tablet 1  . gabapentin (NEURONTIN) 300 MG capsule TAKE 1 CAPSULE AT BEDTIME 90 capsule 1  . hydrALAZINE (APRESOLINE) 10 MG tablet Take 1 tablet (10 mg total) by mouth 2 (two) times daily. 180 tablet 1  . levothyroxine (SYNTHROID) 50 MCG tablet Take 1 tablet (50 mcg total) by mouth daily. 90 tablet 1  . methocarbamol (ROBAXIN) 500 MG tablet Take 1 tablet (500 mg total) by mouth every 8 (eight) hours as needed for muscle spasms. 15 tablet 0  . omeprazole (PRILOSEC) 20 MG capsule Take 20 mg by mouth daily as needed (acid reflux).    . valsartan-hydrochlorothiazide (DIOVAN-HCT) 320-12.5 MG  tablet Take 1 tablet by mouth daily. 90 tablet 1   No current facility-administered medications for this visit.    Allergies:   Amoxicillin   Social History:  The patient  reports that he quit smoking about 46 years ago. His smoking use included cigarettes. He has a 10.00 pack-year smoking history. He quit smokeless tobacco use about 45 years ago.  His smokeless tobacco use included snuff and chew. He reports current alcohol use. He reports that he does not use drugs.   Family History:  The patient's family history includes Alcohol abuse in his father; Dementia in his father; Esophageal varices in his father; Hyperlipidemia in his mother; Hypertension in his mother.    ROS:  Please see the history of present illness.   Otherwise, review of systems is positive for none.   All other systems are reviewed and negative.   PHYSICAL EXAM: VS:  BP (!) 166/80   Pulse (!) 57   Ht 5\' 10"  (1.778 m)   Wt 223 lb (101.2 kg)   SpO2 98%   BMI 32.00 kg/m  , BMI Body mass index is 32 kg/m. GEN: Well nourished, well developed, in no acute distress  HEENT: normal  Neck: no JVD, carotid bruits, or masses Cardiac: RRR; no murmurs, rubs, or gallops,no edema  Respiratory:  clear to auscultation bilaterally, normal work of breathing GI: soft, nontender, nondistended, + BS MS: no deformity or atrophy  Skin: warm and dry Neuro:  Strength and sensation are intact Psych: euthymic mood, full affect  EKG:  EKG is ordered today. Personal review of the ekg ordered shows sinus rhythm, rate 57   Recent Labs: 09/18/2019: ALT 28; BUN 16; Creatinine, Ser 1.11; Hemoglobin 16.4; Platelets 354.0; Potassium 3.6; Sodium 138; TSH 4.16    Lipid Panel     Component Value Date/Time   CHOL 109 09/18/2019 1251   TRIG 184.0 (H) 09/18/2019 1251   HDL 29.90 (L) 09/18/2019 1251   CHOLHDL 4 09/18/2019 1251   VLDL 36.8 09/18/2019 1251   LDLCALC 42 09/18/2019 1251   LDLDIRECT 123.3 06/01/2013 1237     Wt Readings from  Last 3 Encounters:  09/28/19 223 lb (101.2 kg)  09/16/19 224 lb (101.6 kg)  09/01/19 224 lb 3.2 oz (101.7 kg)      Other studies Reviewed: Additional studies/ records that were reviewed today include:  TTE 12/09/15 - Left ventricle: The cavity size was normal. Wall thickness was  increased in a pattern of mild LVH. Systolic function was normal.  The estimated ejection fraction was in the range of 60% to 65%.  Wall motion was normal; there were no regional wall motion  abnormalities.    ASSESSMENT AND PLAN:  1.  Atrial fibrillation: Currently on Xarelto, carvedilol, flecainide.  CHA2DS2-VASc of 2.  He has remained in sinus rhythm since the initial episode of atrial fibrillation.  He is tolerating his medications without issue.  2. Hypertension: Elevated today.  He states that his blood pressures are 130/80 or less at home.  I  make no changes and allow further therapy by his primary physician.  Current medicines are reviewed at length with the patient today.   The patient does not have concerns regarding his medicines.  The following changes were made today: None  Labs/ tests ordered today include:  Orders Placed This Encounter  Procedures  . EKG 12-Lead     Disposition:   FU with   12 months  Signed,  Meredith Leeds, MD  09/28/2019 10:43 AM     [facility] [address] [place] [address] [phone] (office) [phone] (fax)

## 2019-09-28 NOTE — Patient Instructions (Signed)
Medication Instructions:  Your physician recommends that you continue on your current medications as directed. Please refer to the Current Medication list given to you today.  *If you need a refill on your cardiac medications before your next appointment, please call your pharmacy*  Lab Work: None ordered If you have labs (blood work) drawn today and your tests are completely normal, you will receive your results only by: Marland Kitchen MyChart Message (if you have MyChart) OR . A paper copy in the mail If you have any lab test that is abnormal or we need to change your treatment, we will call you to review the results.  Testing/Procedures: None ordered  Follow-Up: At [facility], you and your health needs are our priority.  As part of our continuing mission to provide you with exceptional heart care, we have created designated Provider Care Teams.  These Care Teams include your primary Cardiologist (physician) and Advanced Practice Providers (APPs -  Physician Assistants and Nurse Practitioners) who all work together to provide you with the care you need, when you need it.  Your next appointment:   1 year(s)  The format for your next appointment:   In Person  Provider:   Will Camnitz

## 2019-10-02 ENCOUNTER — Encounter: Payer: Self-pay | Admitting: Family Medicine

## 2019-10-02 DIAGNOSIS — E785 Hyperlipidemia, unspecified: Secondary | ICD-10-CM

## 2019-10-02 MED ORDER — ATORVASTATIN CALCIUM 40 MG PO TABS: 40.0000 mg | ORAL_TABLET | Freq: Every day | ORAL | 1 refills | Status: DC

## 2019-10-02 MED ORDER — LEVOTHYROXINE SODIUM 50 MCG PO TABS: 50.0000 ug | ORAL_TABLET | Freq: Every day | ORAL | 1 refills | Status: DC

## 2019-10-05 ENCOUNTER — Encounter: Payer: Self-pay | Admitting: Family Medicine

## 2019-10-05 MED ORDER — LEVOTHYROXINE SODIUM 50 MCG PO TABS: 50.0000 ug | ORAL_TABLET | Freq: Every day | ORAL | 1 refills | Status: DC

## 2019-10-12 ENCOUNTER — Other Ambulatory Visit: Payer: Self-pay | Admitting: *Deleted

## 2019-10-12 ENCOUNTER — Other Ambulatory Visit: Payer: Self-pay | Admitting: General Practice

## 2019-10-12 ENCOUNTER — Encounter: Payer: Self-pay | Admitting: Family Medicine

## 2019-10-12 MED ORDER — AMLODIPINE BESYLATE 10 MG PO TABS: 10.0000 mg | ORAL_TABLET | Freq: Every day | ORAL | 1 refills | Status: DC

## 2019-10-12 MED ORDER — FLECAINIDE ACETATE 50 MG PO TABS: ORAL_TABLET | ORAL | 3 refills | Status: DC

## 2019-10-19 DIAGNOSIS — L820 Inflamed seborrheic keratosis: Secondary | ICD-10-CM | POA: Diagnosis not present

## 2019-11-12 ENCOUNTER — Other Ambulatory Visit: Payer: Self-pay | Admitting: Cardiology

## 2019-11-12 ENCOUNTER — Encounter: Payer: Self-pay | Admitting: Family Medicine

## 2019-11-12 ENCOUNTER — Other Ambulatory Visit: Payer: Self-pay

## 2019-11-12 MED ORDER — CARVEDILOL 3.125 MG PO TABS: 3.1250 mg | ORAL_TABLET | Freq: Two times a day (BID) | ORAL | 1 refills | Status: DC

## 2019-11-12 MED ORDER — LEVOTHYROXINE SODIUM 50 MCG PO TABS: 50.0000 ug | ORAL_TABLET | Freq: Every day | ORAL | 1 refills | Status: DC

## 2019-11-12 MED ORDER — EZETIMIBE-SIMVASTATIN 10-40 MG PO TABS: ORAL_TABLET | ORAL | 3 refills | Status: DC

## 2019-11-12 NOTE — Telephone Encounter (Signed)
Prescription refill request for Eliquis received.  Last office visit: Camnitz, 09/28/2019 Scr: 1.11, 09/18/2019 Age: 73 y.o. Weight: 101.2 kg   Prescription refill sent.   

## 2019-11-12 NOTE — Telephone Encounter (Signed)
Medications sent to pharmacy per patiet request for Levothyroxine, carvedilol, and ezetimibe-simvastatin.

## 2019-11-13 ENCOUNTER — Telehealth: Payer: Self-pay | Admitting: Family Medicine

## 2019-11-13 NOTE — Progress Notes (Signed)
  Chronic Care Management   Note  11/13/2019 Name: TERIC BOGDA MRN: [id number] DOB: 12/10/1945  [name] is a 74 y.o. year old male who is a primary care patient of Birdie Riddle, [name], MD. I reached out to [name] by phone today in response to a referral sent by Mr. Hurl Ro Saltzman's PCP, [name], MD.   Mr. Pociask was given information about Chronic Care Management services today including:  1. CCM service includes personalized support from designated clinical staff supervised by his physician, including individualized plan of care and coordination with other care providers 2. 24/7 contact phone numbers for assistance for urgent and routine care needs. 3. Service will only be billed when office clinical staff spend 20 minutes or more in a month to coordinate care. 4. Only one practitioner may furnish and bill the service in a calendar month. 5. The patient may stop CCM services at any time (effective at the end of the month) by phone call to the office staff.   Patient did not agree to services and wishes to consider information provided before deciding about enrollment in care management services.   Follow up plan:   Earney Hamburg Upstream Scheduler

## 2019-11-23 DIAGNOSIS — L570 Actinic keratosis: Secondary | ICD-10-CM | POA: Diagnosis not present

## 2019-11-23 DIAGNOSIS — L821 Other seborrheic keratosis: Secondary | ICD-10-CM | POA: Diagnosis not present

## 2019-11-23 DIAGNOSIS — L918 Other hypertrophic disorders of the skin: Secondary | ICD-10-CM | POA: Diagnosis not present

## 2019-11-23 DIAGNOSIS — D1801 Hemangioma of skin and subcutaneous tissue: Secondary | ICD-10-CM | POA: Diagnosis not present

## 2019-12-11 ENCOUNTER — Other Ambulatory Visit: Payer: Self-pay | Admitting: Family Medicine

## 2019-12-22 ENCOUNTER — Other Ambulatory Visit: Payer: Self-pay | Admitting: General Practice

## 2019-12-22 MED ORDER — FUROSEMIDE 20 MG PO TABS: 20.0000 mg | ORAL_TABLET | Freq: Every day | ORAL | 1 refills | Status: DC

## 2019-12-24 ENCOUNTER — Other Ambulatory Visit: Payer: Self-pay

## 2019-12-24 MED ORDER — FLECAINIDE ACETATE 50 MG PO TABS: ORAL_TABLET | ORAL | 2 refills | Status: DC

## 2020-01-07 DIAGNOSIS — M25561 Pain in right knee: Secondary | ICD-10-CM | POA: Diagnosis not present

## 2020-01-07 DIAGNOSIS — S8391XA Sprain of unspecified site of right knee, initial encounter: Secondary | ICD-10-CM | POA: Diagnosis not present

## 2020-01-07 DIAGNOSIS — M1711 Unilateral primary osteoarthritis, right knee: Secondary | ICD-10-CM | POA: Diagnosis not present

## 2020-02-08 ENCOUNTER — Ambulatory Visit (INDEPENDENT_AMBULATORY_CARE_PROVIDER_SITE_OTHER): Payer: Medicare Other

## 2020-02-08 ENCOUNTER — Other Ambulatory Visit: Payer: Self-pay | Admitting: General Practice

## 2020-02-08 VITALS — Ht 71.0 in | Wt 223.0 lb

## 2020-02-08 DIAGNOSIS — Z0000 Encounter for general adult medical examination without abnormal findings: Secondary | ICD-10-CM | POA: Diagnosis not present

## 2020-02-08 DIAGNOSIS — E785 Hyperlipidemia, unspecified: Secondary | ICD-10-CM

## 2020-02-08 MED ORDER — ATORVASTATIN CALCIUM 40 MG PO TABS: 40.0000 mg | ORAL_TABLET | Freq: Every day | ORAL | 1 refills | Status: DC

## 2020-02-08 NOTE — Patient Instructions (Signed)
Stephen Morrison , Thank you for taking time to come for your Medicare Wellness Visit. I appreciate your ongoing commitment to your health goals. Please review the following plan we discussed and let me know if I can assist you in the future.   Screening recommendations/referrals: Colonoscopy: 06/13/2017-No longer indicated Recommended yearly ophthalmology/optometry visit for glaucoma screening and checkup Recommended yearly dental visit for hygiene and checkup  Vaccinations: Influenza vaccine: Up to date-Due 04/2020 Pneumococcal vaccine: Completed vaccines Tdap vaccine: Discuss with pharmacy/insurance Shingles vaccine: Discuss with pharmacy   Covid-19: Completed vaccines  Advanced directives: Please bring a copy to your next appointment  Conditions/risks identified: See problem list  Next appointment: Follow up in one year for your annual wellness visit.  02/14/2021 @ 9am.  Preventive Care 76 Years and Older, Male Preventive care refers to lifestyle choices and visits with your health care provider that can promote health and wellness. What does preventive care include?  A yearly physical exam. This is also called an annual well check.  Dental exams once or twice a year.  Routine eye exams. Ask your health care provider how often you should have your eyes checked.  Personal lifestyle choices, including:  Daily care of your teeth and gums.  Regular physical activity.  Eating a healthy diet.  Avoiding tobacco and drug use.  Limiting alcohol use.  Practicing safe sex.  Taking low doses of aspirin every day.  Taking vitamin and mineral supplements as recommended by your health care provider. What happens during an annual well check? The services and screenings done by your health care provider during your annual well check will depend on your age, overall health, lifestyle risk factors, and family history of disease. Counseling  Your health care provider may ask you questions  about your:  Alcohol use.  Tobacco use.  Drug use.  Emotional well-being.  Home and relationship well-being.  Sexual activity.  Eating habits.  History of falls.  Memory and ability to understand (cognition).  Work and work Statistician. Screening  You may have the following tests or measurements:  Height, weight, and BMI.  Blood pressure.  Lipid and cholesterol levels. These may be checked every 5 years, or more frequently if you are over 71 years old.  Skin check.  Lung cancer screening. You may have this screening every year starting at age 13 if you have a 30-pack-year history of smoking and currently smoke or have quit within the past 15 years.  Fecal occult blood test (FOBT) of the stool. You may have this test every year starting at age 50.  Flexible sigmoidoscopy or colonoscopy. You may have a sigmoidoscopy every 5 years or a colonoscopy every 10 years starting at age 22.  Prostate cancer screening. Recommendations will vary depending on your family history and other risks.  Hepatitis C blood test.  Hepatitis B blood test.  Sexually transmitted disease (STD) testing.  Diabetes screening. This is done by checking your blood sugar (glucose) after you have not eaten for a while (fasting). You may have this done every 1-3 years.  Abdominal aortic aneurysm (AAA) screening. You may need this if you are a current or former smoker.  Osteoporosis. You may be screened starting at age 54 if you are at high risk. Talk with your health care provider about your test results, treatment options, and if necessary, the need for more tests. Vaccines  Your health care provider may recommend certain vaccines, such as:  Influenza vaccine. This is recommended every year.  Tetanus, diphtheria, and acellular pertussis (Tdap, Td) vaccine. You may need a Td booster every 10 years.  Zoster vaccine. You may need this after age 69.  Pneumococcal 13-valent conjugate (PCV13)  vaccine. One dose is recommended after age 46.  Pneumococcal polysaccharide (PPSV23) vaccine. One dose is recommended after age 18. Talk to your health care provider about which screenings and vaccines you need and how often you need them. This information is not intended to replace advice given to you by your health care provider. Make sure you discuss any questions you have with your health care provider. Document Released: 08/26/2015 Document Revised: 04/18/2016 Document Reviewed: 05/31/2015 Elsevier Interactive Patient Education  2017 [place] Prevention in the Home Falls can cause injuries. They can happen to people of all ages. There are many things you can do to make your home safe and to help prevent falls. What can I do on the outside of my home?  Regularly fix the edges of walkways and driveways and fix any cracks.  Remove anything that might make you trip as you walk through a door, such as a raised step or threshold.  Trim any bushes or trees on the path to your home.  Use bright outdoor lighting.  Clear any walking paths of anything that might make someone trip, such as rocks or tools.  Regularly check to see if handrails are loose or broken. Make sure that both sides of any steps have handrails.  Any raised decks and porches should have guardrails on the edges.  Have any leaves, snow, or ice cleared regularly.  Use sand or salt on walking paths during winter.  Clean up any spills in your garage right away. This includes oil or grease spills. What can I do in the bathroom?  Use night lights.  Install grab bars by the toilet and in the tub and shower. Do not use towel bars as grab bars.  Use non-skid mats or decals in the tub or shower.  If you need to sit down in the shower, use a plastic, non-slip stool.  Keep the floor dry. Clean up any water that spills on the floor as soon as it happens.  Remove soap buildup in the tub or shower  regularly.  Attach bath mats securely with double-sided non-slip rug tape.  Do not have throw rugs and other things on the floor that can make you trip. What can I do in the bedroom?  Use night lights.  Make sure that you have a light by your bed that is easy to reach.  Do not use any sheets or blankets that are too big for your bed. They should not hang down onto the floor.  Have a firm chair that has side arms. You can use this for support while you get dressed.  Do not have throw rugs and other things on the floor that can make you trip. What can I do in the kitchen?  Clean up any spills right away.  Avoid walking on wet floors.  Keep items that you use a lot in easy-to-reach places.  If you need to reach something above you, use a strong step stool that has a grab bar.  Keep electrical cords out of the way.  Do not use floor polish or wax that makes floors slippery. If you must use wax, use non-skid floor wax.  Do not have throw rugs and other things on the floor that can make you trip. What can I do  with my stairs?  Do not leave any items on the stairs.  Make sure that there are handrails on both sides of the stairs and use them. Fix handrails that are broken or loose. Make sure that handrails are as long as the stairways.  Check any carpeting to make sure that it is firmly attached to the stairs. Fix any carpet that is loose or worn.  Avoid having throw rugs at the top or bottom of the stairs. If you do have throw rugs, attach them to the floor with carpet tape.  Make sure that you have a light switch at the top of the stairs and the bottom of the stairs. If you do not have them, ask someone to add them for you. What else can I do to help prevent falls?  Wear shoes that:  Do not have high heels.  Have rubber bottoms.  Are comfortable and fit you well.  Are closed at the toe. Do not wear sandals.  If you use a stepladder:  Make sure that it is fully  opened. Do not climb a closed stepladder.  Make sure that both sides of the stepladder are locked into place.  Ask someone to hold it for you, if possible.  Clearly mark and make sure that you can see:  Any grab bars or handrails.  First and last steps.  Where the edge of each step is.  Use tools that help you move around (mobility aids) if they are needed. These include:  Canes.  Walkers.  Scooters.  Crutches.  Turn on the lights when you go into a dark area. Replace any light bulbs as soon as they burn out.  Set up your furniture so you have a clear path. Avoid moving your furniture around.  If any of your floors are uneven, fix them.  If there are any pets around you, be aware of where they are.  Review your medicines with your doctor. Some medicines can make you feel dizzy. This can increase your chance of falling. Ask your doctor what other things that you can do to help prevent falls. This information is not intended to replace advice given to you by your health care provider. Make sure you discuss any questions you have with your health care provider. Document Released: 05/26/2009 Document Revised: 01/05/2016 Document Reviewed: 09/03/2014 Elsevier Interactive Patient Education  2017 [employer].

## 2020-02-08 NOTE — Progress Notes (Signed)
Subjective:   Stephen Morrison is a 74 y.o. male who presents for an Initial Medicare Annual Wellness Visit.   I connected with [name]  today by telephone and verified that I am speaking with the correct person using two identifiers. Location patient: home Location provider: work Persons participating in the virtual visit: patient, Marine scientist.    I discussed the limitations, risks, security and privacy concerns of performing an evaluation and management service by telephone and the availability of in person appointments. I also discussed with the patient that there may be a patient responsible charge related to this service. The patient expressed understanding and verbally consented to this telephonic visit.    Interactive audio and video telecommunications were attempted between this provider and patient, however failed, due to patient having technical difficulties OR patient did not have access to video capability.  We continued and completed visit with audio only.  Some vital signs may be absent or patient reported.   Time Spent with patient on telephone encounter: 25 minutes  Review of Systems     Cardiac Risk Factors include: advanced age (>32men, >26 women);dyslipidemia;hypertension;obesity (BMI >30kg/m2)     Objective:    Today's Vitals   02/08/20 0857  Weight: 223 lb (101.2 kg)  Height: 5\' 11"  (1.803 m)   Body mass index is 31.1 kg/m.  Advanced Directives 02/08/2020 07/03/2017 06/13/2017 11/05/2015 05/17/2014 05/05/2014 05/04/2014  Does Patient Have a Medical Advance Directive? Yes Yes Yes Yes Yes No No  Type of Paramedic of [place];Living will [place];Living will Healthcare Power of [place] Living will [place];Living will - -  Copy of Colon in Chart? No - copy requested No - copy requested - - - - -  Would patient like information on creating a medical advance directive? - - - - - No - patient  declined information No - patient declined information    Current Medications (verified) Outpatient Encounter Medications as of 02/08/2020  Medication Sig  . acetaminophen (TYLENOL) 650 MG CR tablet Take 650 mg by mouth every 8 (eight) hours as needed for pain.  Marland Kitchen amLODipine (NORVASC) 10 MG tablet Take 1 tablet (10 mg total) by mouth daily.  Marland Kitchen atorvastatin (LIPITOR) 40 MG tablet Take 1 tablet (40 mg total) by mouth daily.  . carvedilol (COREG) 3.125 MG tablet Take 1 tablet (3.125 mg total) by mouth 2 (two) times daily.  . cetirizine (ZYRTEC) 10 MG tablet Take 10 mg by mouth as needed for allergies.   Marland Kitchen ELIQUIS 5 MG TABS tablet TAKE 1 TABLET BY MOUTH  TWICE DAILY  . ezetimibe-simvastatin (VYTORIN) 10-40 MG tablet TAKE 1 TABLET MONDAY, WEDNESDAY, AND FRIDAY  . flecainide (TAMBOCOR) 50 MG tablet TAKE ONE AND ONE-HALF TABLETS TWICE A DAY  . furosemide (LASIX) 20 MG tablet Take 1 tablet (20 mg total) by mouth daily.  Marland Kitchen glucosamine-chondroitin 500-400 MG tablet Take 1 tablet by mouth 3 (three) times daily.  . hydrALAZINE (APRESOLINE) 10 MG tablet Take 1 tablet (10 mg total) by mouth 2 (two) times daily.  Marland Kitchen ibuprofen (ADVIL) 200 MG tablet Take 200 mg by mouth every 6 (six) hours as needed.  Marland Kitchen levothyroxine (SYNTHROID) 50 MCG tablet Take 1 tablet (50 mcg total) by mouth daily.  Marland Kitchen omeprazole (PRILOSEC) 20 MG capsule Take 20 mg by mouth daily as needed (acid reflux).  . Thiamine HCl (VITAMIN B-1) 250 MG tablet Take 250 mg by mouth daily.  . valsartan-hydrochlorothiazide (DIOVAN-HCT)  320-12.5 MG tablet TAKE 1 TABLET BY MOUTH EVERY DAY  . colchicine 0.6 MG tablet Take 1 tablet (0.6 mg total) by mouth 2 (two) times daily as needed (pain). Reported on 10/03/2015 (Patient not taking: Reported on 02/08/2020)  . gabapentin (NEURONTIN) 300 MG capsule TAKE 1 CAPSULE AT BEDTIME (Patient not taking: Reported on 02/08/2020)  . methocarbamol (ROBAXIN) 500 MG tablet Take 1 tablet (500 mg total) by mouth every 8 (eight)  hours as needed for muscle spasms. (Patient not taking: Reported on 02/08/2020)   No facility-administered encounter medications on file as of 02/08/2020.    Allergies (verified) Amoxicillin   History: Past Medical History:  Diagnosis Date  . Allergy   . Arthritis   . Atrial fibrillation ([place])    one time incident, on medication now with no further problems  . BPH (benign prostatic hyperplasia)   . Diverticulosis of colon   . GERD (gastroesophageal reflux disease)   . Gout    STABLE PER PT ON 05-12-2014  . Hyperlipidemia   . Hypertension   . Hypothyroidism   . PSA elevation   . Tick bite 02/25/2016  . Urinary retention    Past Surgical History:  Procedure Laterality Date  . COLONOSCOPY  01-31-2007  . EXCISION BENIGN CYST POSTERIOR NECK  2010  . INSERTION OF SUPRAPUBIC CATHETER N/A 05/17/2014   Procedure: INSERTION OF SUPRAPUBIC CATHETER;  Surgeon: Bernestine Amass, MD;  Location: [facility];  Service: Urology;  Laterality: N/A;  . ORIF RIGHT WRIST FX  2009  . TRANSURETHRAL RESECTION OF PROSTATE N/A 05/17/2014   Procedure: TRANSURETHRAL RESECTION OF THE PROSTATE WITH GYRUS INSTRUMENTS;  Surgeon: Bernestine Amass, MD;  [facility];  Service: Urology;  Laterality: N/A;  . VASECTOMY     Family History  Problem Relation Age of Onset  . Alcohol abuse Father   . Dementia Father   . Esophageal varices Father   . Hyperlipidemia Mother   . Hypertension Mother   . Colon cancer Neg Hx   . Esophageal cancer Neg Hx   . Stomach cancer Neg Hx   . Rectal cancer Neg Hx    Social History   Socioeconomic History  . Marital status: Married    Spouse name: Not on file  . Number of children: Not on file  . Years of education: Not on file  . Highest education level: Not on file  Occupational History  . Occupation: retired  Tobacco Use  . Smoking status: Former Smoker    Packs/day: 1.00    Years: 10.00    Pack years: 10.00    Types: Cigarettes      Quit date: 08/13/1973    Years since quitting: 46.5  . Smokeless tobacco: Former Systems developer    Types: Snuff, Sarina Ser    Quit date: 05/12/1974  Vaping Use  . Vaping Use: Never used  Substance and Sexual Activity  . Alcohol use: Yes    Alcohol/week: 0.0 standard drinks    Comment: OCCASIONALLY  . Drug use: No  . Sexual activity: Not on file  Other Topics Concern  . Not on file  Social History Narrative  . Not on file   Social Determinants of Health   Financial Resource Strain: Low Risk   . Difficulty of Paying Living Expenses: Not hard at all  Food Insecurity: No Food Insecurity  . Worried About Charity fundraiser in the Last Year: Never true  . Ran Out of Food in the Last  Year: Never true  Transportation Needs: No Transportation Needs  . Lack of Transportation (Medical): No  . Lack of Transportation (Non-Medical): No  Physical Activity: Insufficiently Active  . Days of Exercise per Week: 3 days  . Minutes of Exercise per Session: 30 min  Stress: No Stress Concern Present  . Feeling of Stress : Not at all  Social Connections:   . Frequency of Communication with Friends and Family:   . Frequency of Social Gatherings with Friends and Family:   . Attends Religious Services:   . Active Member of Clubs or Organizations:   . Attends Archivist Meetings:   Marland Kitchen Marital Status:     Tobacco Counseling Counseling given: Not Answered Former smoker  Clinical Intake:  Pre-visit preparation completed: Yes  Pain : No/denies pain     Nutritional Status: BMI > 30  Obese Nutritional Risks: None Diabetes: No  How often do you need to have someone help you when you read instructions, pamphlets, or other written materials from your doctor or pharmacy?: 1 - Never What is the last grade level you completed in school?: high school graduate  Diabetic?No  Interpreter Needed?: No  Information entered by :: Caroleen Hamman LPN   Activities of Daily Living In your present state  of health, do you have any difficulty performing the following activities: 02/08/2020 09/16/2019  Hearing? N N  Vision? N N  Difficulty concentrating or making decisions? N N  Walking or climbing stairs? N N  Dressing or bathing? N N  Doing errands, shopping? N N  Preparing Food and eating ? N N  Using the Toilet? N N  In the past six months, have you accidently leaked urine? N N  Do you have problems with loss of bowel control? N N  Managing your Medications? N N  Managing your Finances? N N  Housekeeping or managing your Housekeeping? N N  Some recent data might be hidden    Patient Care Team: [name], MD as PCP - General (Family Medicine) Constance Haw, MD as PCP - Cardiology (Cardiology) [name], MD as Consulting Physician (Urology) [facility], [place]  Indicate any recent Medical Services you may have received from other than [facility] providers in the past year (date may be approximate).     Assessment:   This is a routine wellness examination for Stephen Morrison.  Hearing/Vision screen  Hearing Screening   125Hz  250Hz  500Hz  1000Hz  2000Hz  3000Hz  4000Hz  6000Hz  8000Hz   Right ear:           Left ear:           Comments: No hearing loss  Vision Screening Comments: Wears reading glasses Last eye exam 3-4 years ago  Dietary issues and exercise activities discussed: Current Exercise Habits: Home exercise routine, Type of exercise: walking, Time (Minutes): 30, Frequency (Times/Week): 3, Weekly Exercise (Minutes/Week): 90, Intensity: Mild, Exercise limited by: orthopedic condition(s) (knee pain)  Goals    . Weight (lb) < 200 lb (90.7 kg)     Lose weight by increasing activity.       Depression Screen PHQ 2/9 Scores 02/08/2020 09/16/2019 08/19/2017 07/03/2017 07/03/2017 07/02/2016 10/03/2015  PHQ - 2 Score 0 0 0 0 0 0 0  PHQ- 9 Score - 0 0 - 0 0 -  Exception Documentation - - - - - - Patient refusal    Fall Risk Fall Risk  02/08/2020 09/16/2019  09/16/2019 07/08/2019 03/12/2019  Falls in the past year? 0 0 0  0 (No Data)  Comment - - - Emmi Telephone Survey: data to providers prior to load Emmi Telephone Survey: data to providers prior to load  Number falls in past yr: 0 0 0 - (No Data)  Comment - - - - Emmi Telephone Survey Actual Response =   Injury with Fall? 0 0 0 - -  Follow up Falls prevention discussed Falls evaluation completed Falls evaluation completed - -    Any stairs in or around the home? No  Home free of loose throw rugs in walkways, pet beds, electrical cords, etc? Yes  Adequate lighting in your home to reduce risk of falls? Yes   ASSISTIVE DEVICES UTILIZED TO PREVENT FALLS:  Life alert? No  Use of a cane, walker or w/c? No  Grab bars in the bathroom? Yes  Shower chair or bench in shower? No  Elevated toilet seat or a handicapped toilet? No   TIMED UP AND GO:  Was the test performed? No . Virtual visit    Cognitive Function: MMSE - Mini Mental State Exam 07/03/2017  Not completed: (No Data)  Orientation to time 5  Orientation to Place 5  Registration 3  Attention/ Calculation 0  Recall 3  Language- name 2 objects 2  Language- repeat 1  Language- follow 3 step command 3  Language- read & follow direction 1  Write a sentence 1  Copy design 1  Total score 25     6CIT Screen 02/08/2020  What Year? 0 points  What month? 0 points  What time? 0 points  Count back from 20 0 points  Months in reverse 0 points  Repeat phrase 0 points  Total Score 0    Immunizations Immunization History  Administered Date(s) Administered  . Influenza Split 06/27/2011, 07/02/2012  . Influenza Whole 09/24/2007, 05/03/2008, 09/28/2009  . Influenza, High Dose Seasonal PF 06/01/2013  . Influenza,inj,Quad PF,6+ Mos 06/30/2014, 05/18/2015, 07/02/2016, 06/17/2017  . Influenza-Unspecified 06/09/2018  . PFIZER SARS-COV-2 Vaccination 09/03/2019, 09/25/2019  . Pneumococcal Conjugate-13 03/31/2015  . Pneumococcal  Polysaccharide-23 10/25/2008, 07/02/2016  . Td 09/24/2007  . Zoster 05/14/2012    TDAP status: Due, Education has been provided regarding the importance of this vaccine. Advised may receive this vaccine at local pharmacy or Health Dept. Aware to provide a copy of the vaccination record if obtained from local pharmacy or Health Dept. Verbalized acceptance and understanding.   Flu Vaccine status: Up to date   Pneumococcal vaccine status: Up to date   Covid-19 vaccine status: Completed vaccines  Qualifies for Shingles Vaccine? Yes   Zostavax completed Yes   Shingrix Completed?: No.    Education has been provided regarding the importance of this vaccine. Patient has been advised to call insurance company to determine out of pocket expense if they have not yet received this vaccine. Advised may also receive vaccine at local pharmacy or Health Dept. Verbalized acceptance and understanding.  Screening Tests Health Maintenance  Topic Date Due  . TETANUS/TDAP  09/23/2017  . INFLUENZA VACCINE  03/13/2020  . COLONOSCOPY  06/14/2027  . COVID-19 Vaccine  Completed  . Hepatitis C Screening  Completed  . PNA vac Low Risk Adult  Completed    Health Maintenance  Health Maintenance Due  Topic Date Due  . TETANUS/TDAP  09/23/2017    Colorectal cancer screening: Completed 06/13/2017. Repeat every 10 years  Lung Cancer Screening: (Low Dose CT Chest recommended if Age 75-80 years, 30 pack-year currently smoking OR have quit w/in 15years.) does not qualify.  Additional Screening:  Hepatitis C Screening: Completed 07/02/2016  Vision Screening: Recommended annual ophthalmology exams for early detection of glaucoma and other disorders of the eye. Is the patient up to date with their annual eye exam?  No  Patient is aware he is due for an eye exam and plans to make an appt.   Dental Screening: Recommended annual dental exams for proper oral hygiene  Community Resource Referral / Chronic  Care Management: CRR required this visit?  No   CCM required this visit?  No      Plan:     I have personally reviewed and noted the following in the patient's chart:   . Medical and social history . Use of alcohol, tobacco or illicit drugs  . Current medications and supplements . Functional ability and status . Nutritional status . Physical activity . Advanced directives . List of other physicians . Hospitalizations, surgeries, and ER visits in previous 12 months . Vitals . Screenings to include cognitive, depression, and falls . Referrals and appointments  In addition, I have reviewed and discussed with patient certain preventive protocols, quality metrics, and best practice recommendations. A written personalized care plan for preventive services as well as general preventive health recommendations were provided to patient.  Due to this being a telephonic visit, the after visit summary with patients personalized plan was offered to patient via mail or my-chart. Patient would like to access on my-chart.     Marta Antu, LPN   4/45/1460  Nurse Health Advisor  Nurse Notes: None

## 2020-02-12 ENCOUNTER — Encounter: Payer: Self-pay | Admitting: Family Medicine

## 2020-03-23 ENCOUNTER — Encounter: Payer: Self-pay | Admitting: Family Medicine

## 2020-03-23 MED ORDER — HYDRALAZINE HCL 10 MG PO TABS: 10.0000 mg | ORAL_TABLET | Freq: Two times a day (BID) | ORAL | 1 refills | Status: DC

## 2020-03-23 MED ORDER — VALSARTAN-HYDROCHLOROTHIAZIDE 320-12.5 MG PO TABS: 1.0000 | ORAL_TABLET | Freq: Every day | ORAL | 1 refills | Status: DC

## 2020-03-24 ENCOUNTER — Other Ambulatory Visit: Payer: Self-pay | Admitting: Family Medicine

## 2020-04-06 ENCOUNTER — Other Ambulatory Visit: Payer: Self-pay | Admitting: Family Medicine

## 2020-04-09 ENCOUNTER — Encounter: Payer: Self-pay | Admitting: Family Medicine

## 2020-04-11 DIAGNOSIS — Z20822 Contact with and (suspected) exposure to covid-19: Secondary | ICD-10-CM | POA: Diagnosis not present

## 2020-04-11 MED ORDER — AMLODIPINE BESYLATE 10 MG PO TABS: 10.0000 mg | ORAL_TABLET | Freq: Every day | ORAL | 1 refills | Status: DC

## 2020-04-12 ENCOUNTER — Other Ambulatory Visit: Payer: Self-pay | Admitting: Family Medicine

## 2020-04-18 ENCOUNTER — Other Ambulatory Visit: Payer: Self-pay | Admitting: Cardiology

## 2020-04-19 NOTE — Telephone Encounter (Signed)
Prescription refill request for Eliquis received.  Last office visit: [place], 09/28/2019 Scr: 1.11, 09/18/2019 Age: 74 y.o. Weight: 101.2 kg   Prescription refill sent.

## 2020-04-20 ENCOUNTER — Other Ambulatory Visit: Payer: Self-pay | Admitting: Family Medicine

## 2020-05-02 ENCOUNTER — Telehealth: Payer: Self-pay | Admitting: Family Medicine

## 2020-05-02 NOTE — Chronic Care Management (AMB) (Signed)
°  Chronic Care Management   Note  05/02/2020 Name: [name] MRN: [id number] DOB: 1946-04-30  Stephen Morrison is a 74 y.o. year old male who is a primary care patient of Birdie Riddle, [name], MD. I reached out to Stephen Morrison by phone today in response to a referral sent by Stephen Morrison's PCP, [name], MD.   Stephen Morrison was given information about Chronic Care Management services today including:  1. CCM service includes personalized support from designated clinical staff supervised by his physician, including individualized plan of care and coordination with other care providers 2. 24/7 contact phone numbers for assistance for urgent and routine care needs. 3. Service will only be billed when office clinical staff spend 20 minutes or more in a month to coordinate care. 4. Only one practitioner may furnish and bill the service in a calendar month. 5. The patient may stop CCM services at any time (effective at the end of the month) by phone call to the office staff.   Patient agreed to services and verbal consent obtained.   Follow up plan:   Lauretta Grill Upstream Scheduler

## 2020-05-23 ENCOUNTER — Encounter: Payer: Self-pay | Admitting: Family Medicine

## 2020-05-23 MED ORDER — CARVEDILOL 3.125 MG PO TABS: 3.1250 mg | ORAL_TABLET | Freq: Two times a day (BID) | ORAL | 1 refills | Status: DC

## 2020-05-31 ENCOUNTER — Other Ambulatory Visit: Payer: Self-pay | Admitting: Family Medicine

## 2020-05-31 ENCOUNTER — Telehealth: Payer: Self-pay

## 2020-05-31 NOTE — Progress Notes (Unsigned)
Chronic Care Management Pharmacy Assistant   Name: AZAM GERVASI  MRN: [id number] DOB: 1946/06/22  Reason for Encounter: Initial   Patient Questions:  1.  Have you seen any other providers since your last visit? {CHL THN UPSTREAM YES/NO:24149}  2.  Any changes in your medicines or health? {CHL THN UPSTREAM YES/NO:24149}   [name],  74 y.o. , male presents for their {Initial/Follow-up:3041532} CCM visit with the clinical pharmacist {CHL HP Upstream Pharm visit VQQV:[id number]}.  PCP : [name], MD  Allergies:   Allergies  Allergen Reactions  . Amoxicillin Swelling and Rash    Tongue swells Has patient had a PCN reaction causing immediate rash, facial/tongue/throat swelling, SOB or lightheadedness with hypotension: {YES} Has patient had a PCN reaction causing severe rash involving mucus membranes or skin necrosis: {NO} Has patient had a PCN reaction that required hospitalization {YES} Has patient had a PCN reaction occurring within the last 10 years: {NO} If all of the above answers are "NO", then may proceed with Cephalosporin use.     Medications: Outpatient Encounter Medications as of 05/31/2020  Medication Sig  . acetaminophen (TYLENOL) 650 MG CR tablet Take 650 mg by mouth every 8 (eight) hours as needed for pain.  Marland Kitchen amLODipine (NORVASC) 10 MG tablet Take 1 tablet (10 mg total) by mouth daily.  Marland Kitchen atorvastatin (LIPITOR) 40 MG tablet Take 1 tablet (40 mg total) by mouth daily.  . carvedilol (COREG) 3.125 MG tablet Take 1 tablet (3.125 mg total) by mouth 2 (two) times daily.  . cetirizine (ZYRTEC) 10 MG tablet Take 10 mg by mouth as needed for allergies.   Marland Kitchen colchicine 0.6 MG tablet Take 1 tablet (0.6 mg total) by mouth 2 (two) times daily as needed (pain). Reported on 10/03/2015 (Patient not taking: Reported on 02/08/2020)  . ELIQUIS 5 MG TABS tablet TAKE 1 TABLET BY MOUTH  TWICE DAILY  . ezetimibe-simvastatin (VYTORIN) 10-40 MG tablet TAKE 1 TABLET MONDAY,  WEDNESDAY, AND FRIDAY  . flecainide (TAMBOCOR) 50 MG tablet TAKE ONE AND ONE-HALF TABLETS TWICE A DAY  . furosemide (LASIX) 20 MG tablet TAKE 1 TABLET BY MOUTH  DAILY  . gabapentin (NEURONTIN) 300 MG capsule TAKE 1 CAPSULE AT BEDTIME (Patient not taking: Reported on 02/08/2020)  . glucosamine-chondroitin 500-400 MG tablet Take 1 tablet by mouth 3 (three) times daily.  . hydrALAZINE (APRESOLINE) 10 MG tablet TAKE 1 TABLET BY MOUTH TWICE A DAY  . ibuprofen (ADVIL) 200 MG tablet Take 200 mg by mouth every 6 (six) hours as needed.  Marland Kitchen levothyroxine (SYNTHROID) 50 MCG tablet TAKE 1 TABLET BY MOUTH  DAILY  . methocarbamol (ROBAXIN) 500 MG tablet Take 1 tablet (500 mg total) by mouth every 8 (eight) hours as needed for muscle spasms. (Patient not taking: Reported on 02/08/2020)  . omeprazole (PRILOSEC) 20 MG capsule Take 20 mg by mouth daily as needed (acid reflux).  . Thiamine HCl (VITAMIN B-1) 250 MG tablet Take 250 mg by mouth daily.  . valsartan-hydrochlorothiazide (DIOVAN-HCT) 320-12.5 MG tablet Take 1 tablet by mouth daily.   No facility-administered encounter medications on file as of 05/31/2020.    Current Diagnosis: Patient Active Problem List   Diagnosis Date Noted  . Urinary retention   . PSA elevation   . GERD (gastroesophageal reflux disease)   . Diverticulosis of colon   . BPH (benign prostatic hyperplasia)   . Atrial fibrillation ([place])   . Arthritis   . Allergy   . Colon cancer  screening 05/29/2017  . Chronic anticoagulation 05/29/2017  . Lower leg edema 12/28/2016  . Tick bite 02/25/2016  . Erectile dysfunction 10/04/2015  . Physical exam 03/31/2015  . Benign prostatic hyperplasia with urinary retention 05/17/2014  . Urinary retention due to benign prostatic hyperplasia 05/12/2014  . Osteoarthritis 04/09/2014  . LIPOMA 03/06/2010  . FATTY LIVER DISEASE 03/06/2010  . Hypothyroidism 10/25/2008  . PROSTATE SPECIFIC ANTIGEN, ELEVATED 03/21/2007  . Hyperlipidemia 02/03/2007    . GOUT 02/03/2007  . Essential hypertension 02/03/2007    Goals Addressed   None     Follow-Up:  {Upstream CPA Follow-up:24147}

## 2020-06-03 ENCOUNTER — Other Ambulatory Visit: Payer: Self-pay

## 2020-06-03 ENCOUNTER — Ambulatory Visit: Payer: Medicare Other

## 2020-06-03 DIAGNOSIS — I4891 Unspecified atrial fibrillation: Secondary | ICD-10-CM

## 2020-06-03 DIAGNOSIS — E785 Hyperlipidemia, unspecified: Secondary | ICD-10-CM

## 2020-06-03 DIAGNOSIS — I10 Essential (primary) hypertension: Secondary | ICD-10-CM

## 2020-06-03 NOTE — Progress Notes (Signed)
Chronic Care Management Pharmacy  Name: DAWON TROOP  MRN: [id number]  DOB: November 24, 1945  Chief Complaint/ HPI  [name], 74 y.o., male, presents for their initial CCM visit with the clinical pharmacistin office .  PCP : [name], MD  Chronic conditions include:  Encounter Diagnoses  Name Primary?  . Hyperlipidemia, unspecified hyperlipidemia type Yes  . Atrial fibrillation, unspecified type ([place])   . Essential hypertension      Patient Active Problem List   Diagnosis Date Noted  . Urinary retention   . PSA elevation   . GERD (gastroesophageal reflux disease)   . Diverticulosis of colon   . BPH (benign prostatic hyperplasia)   . Atrial fibrillation ([place])   . Arthritis   . Allergy   . Colon cancer screening 05/29/2017  . Chronic anticoagulation 05/29/2017  . Lower leg edema 12/28/2016  . Tick bite 02/25/2016  . Erectile dysfunction 10/04/2015  . Physical exam 03/31/2015  . Benign prostatic hyperplasia with urinary retention 05/17/2014  . Urinary retention due to benign prostatic hyperplasia 05/12/2014  . Osteoarthritis 04/09/2014  . LIPOMA 03/06/2010  . FATTY LIVER DISEASE 03/06/2010  . Hypothyroidism 10/25/2008  . PROSTATE SPECIFIC ANTIGEN, ELEVATED 03/21/2007  . Hyperlipidemia 02/03/2007  . GOUT 02/03/2007  . Essential hypertension 02/03/2007   Past Surgical History:  Procedure Laterality Date  . COLONOSCOPY  01-31-2007  . EXCISION BENIGN CYST POSTERIOR NECK  2010  . INSERTION OF SUPRAPUBIC CATHETER N/A 05/17/2014   Procedure: INSERTION OF SUPRAPUBIC CATHETER;  Surgeon: Bernestine Amass, MD;  [facility];  Service: Urology;  Laterality: N/A;  . ORIF RIGHT WRIST FX  2009  . TRANSURETHRAL RESECTION OF PROSTATE N/A 05/17/2014   Procedure: TRANSURETHRAL RESECTION OF THE PROSTATE WITH GYRUS INSTRUMENTS;  Surgeon: Bernestine Amass, MD;  Location: [facility];  Service: Urology;  Laterality: N/A;  . VASECTOMY     Family  History  Problem Relation Age of Onset  . Alcohol abuse Father   . Dementia Father   . Esophageal varices Father   . Hyperlipidemia Mother   . Hypertension Mother   . Colon cancer Neg Hx   . Esophageal cancer Neg Hx   . Stomach cancer Neg Hx   . Rectal cancer Neg Hx    Allergies  Allergen Reactions  . Amoxicillin Swelling and Rash    Tongue swells Has patient had a PCN reaction causing immediate rash, facial/tongue/throat swelling, SOB or lightheadedness with hypotension: Y Has patient had a PCN reaction causing severe rash involving mucus membranes or skin necrosis: N Has patient had a PCN reaction that required hospitalization Y Has patient had a PCN reaction occurring within the last 10 years: N If all of the above answers are "NO", then may proceed with Cephalosporin use.    Outpatient Encounter Medications as of 06/03/2020  Medication Sig  . amLODipine (NORVASC) 10 MG tablet Take 1 tablet (10 mg total) by mouth daily.  Marland Kitchen atorvastatin (LIPITOR) 40 MG tablet Take 1 tablet (40 mg total) by mouth daily.  . carvedilol (COREG) 3.125 MG tablet Take 1 tablet (3.125 mg total) by mouth 2 (two) times daily.  . colchicine 0.6 MG tablet Take 1 tablet (0.6 mg total) by mouth 2 (two) times daily as needed (pain). Reported on 10/03/2015  . diclofenac Sodium (VOLTAREN) 1 % GEL Apply 4 g topically 4 (four) times daily.  Marland Kitchen ELIQUIS 5 MG TABS tablet TAKE 1 TABLET BY MOUTH  TWICE DAILY  .  ezetimibe-simvastatin (VYTORIN) 10-40 MG tablet TAKE 1 TABLET MONDAY, WEDNESDAY, AND FRIDAY  . flecainide (TAMBOCOR) 50 MG tablet TAKE ONE AND ONE-HALF TABLETS TWICE A DAY  . furosemide (LASIX) 20 MG tablet TAKE 1 TABLET BY MOUTH  DAILY  . gabapentin (NEURONTIN) 300 MG capsule TAKE 1 CAPSULE AT BEDTIME  . hydrALAZINE (APRESOLINE) 10 MG tablet TAKE 1 TABLET BY MOUTH TWICE A DAY  . levothyroxine (SYNTHROID) 50 MCG tablet TAKE 1 TABLET BY MOUTH  DAILY  . omeprazole (PRILOSEC) 20 MG capsule Take 20 mg by mouth daily  as needed (acid reflux).  . TURMERIC CURCUMIN PO Take 1,000 mg by mouth daily.  . valsartan-hydrochlorothiazide (DIOVAN-HCT) 320-12.5 MG tablet Take 1 tablet by mouth daily.  Marland Kitchen acetaminophen (TYLENOL) 650 MG CR tablet Take 650 mg by mouth every 8 (eight) hours as needed for pain.  . cetirizine (ZYRTEC) 10 MG tablet Take 10 mg by mouth as needed for allergies.   Marland Kitchen glucosamine-chondroitin 500-400 MG tablet Take 1 tablet by mouth 3 (three) times daily.  Marland Kitchen ibuprofen (ADVIL) 200 MG tablet Take 200 mg by mouth every 6 (six) hours as needed.  . methocarbamol (ROBAXIN) 500 MG tablet Take 1 tablet (500 mg total) by mouth every 8 (eight) hours as needed for muscle spasms. (Patient not taking: Reported on 02/08/2020)  . Thiamine HCl (VITAMIN B-1) 250 MG tablet Take 250 mg by mouth daily. (Patient not taking: Reported on 06/03/2020)   No facility-administered encounter medications on file as of 06/03/2020.   Patient Care Team    Relationship Specialty Notifications Start End  [name], MD PCP - General Family Medicine  04/09/14    Comment: Dorette Grate, Ocie Doyne, MD PCP - Cardiology Cardiology Admissions 11/23/15    Comment: Electrophysiologist  [name], MD Consulting Physician Urology  05/17/14   [facility]    02/08/20   [name], [facility] Pharmacist Pharmacist  05/02/20    Comment: [phone]   Current Diagnosis/Assessment: Goals Addressed            This Visit's Progress   . PharmD Care Plan       CARE PLAN ENTRY (see longitudinal plan of care for additional care plan information)  Current Barriers:  . Chronic Disease Management support, education, and care coordination needs related to Hypertension, Hyperlipidemia, and Atrial Fibrillation   Hypertension BP Readings from Last 3 Encounters:  09/28/19 (!) 166/80  09/16/19 (!) 147/81  09/01/19 (!) 160/82   . Pharmacist Clinical Goal(s): o Over the next 180 days, patient will work with  PharmD and providers to maintain BP goal <130/80 . Current regimen:  . Amlodipine 10 mg once daily  . Hydralazine 10 mg twice daily . Valsartan-HCTZ 320/12.5 mg once daily  . Interventions: o Diet and exercise recommendations . Patient self care activities - Over the next 180 days, patient will: o Check BP at least once every 1-2 weeks, document, and provide at future appointments o Ensure daily salt intake < 1500 mg/day o Consider using [employer] pool for exercise  Hyperlipidemia Lab Results  Component Value Date/Time   LDLCALC 42 09/18/2019 12:51 PM   LDLDIRECT 123.3 06/01/2013 12:37 PM   . Pharmacist Clinical Goal(s): o Over the next 180 days, patient will work with PharmD and providers to maintain LDL goal < 70 . Current regimen:  o Simvastatin-ezetimibe 40-10 mg once daily o Atorvastatin 40 mg once daily . Interventions: o Diet and exercise recommendations . Patient self care activities - Over the  next 180 days, patient will: o Consider using [employer] pool for exercise  Afib . Pharmacist Clinical Goal(s) o Over the next 180 days, patient will work with PharmD and providers to review accessibility on anticoagulation regimen . Current regimen:  o Eliquis 5 mg twice daily . Interventions: o Review PAP eligibility, help with application process as appropriate . Patient self care activities - Over the next 180 days, patient will: o If needed, please complete patient section of patient assistance application  Medication management . Pharmacist Clinical Goal(s): o Over the next 180 days, patient will work with PharmD and providers to maintain optimal medication adherence . Current pharmacy: [facility] . Interventions o Comprehensive medication review performed. o Continue current medication management strategy . Patient self care activities - Over the next 180 days, patient will: o Take medications as prescribed o Report any questions or concerns to PharmD and/or provider(s) Initial  goal documentation.      Hypertension   BP goal <140/90 over next 6 months.  BP Readings from Last 3 Encounters:  09/28/19 (!) 166/80  09/16/19 (!) 147/81  09/01/19 (!) 160/82   Patient checks BP at home daily. According to home readings, BP is 120s/80s, states always elevated in office.  Office readings not at goal, home readings at goal. Taking carvedilol, flecainide, in addition to below meds: . Amlodipine 10 mg once daily  . Hydralazine 10 mg twice daily . Valsartan-HCTZ 320/12.5 mg once daily   We discussed diet and exercise extensively. Portion control, avoids sweets. Active outdoors and outside, golf ~2x/wk.   Plan  Continue current medications.  Hyperlipidemia   LDL goal < 70  Lipid Panel     Component Value Date/Time   CHOL 109 09/18/2019 1251   TRIG 184.0 (H) 09/18/2019 1251   HDL 29.90 (L) 09/18/2019 1251   LDLCALC 42 09/18/2019 1251   LDLDIRECT 123.3 06/01/2013 1237    Hepatic Function Latest Ref Rng & Units 09/18/2019 12/09/2017 07/03/2017  Total Protein 6.0 - 8.3 g/dL 7.5 7.0 7.1  Albumin 3.5 - 5.2 g/dL 4.3 4.3 4.3  AST 0 - 37 U/L '22 22 25  ' ALT 0 - 53 U/L '28 29 29  ' Alk Phosphatase 39 - 117 U/L 66 57 61  Total Bilirubin 0.2 - 1.2 mg/dL 0.9 0.9 0.8  Bilirubin, Direct 0.0 - 0.3 mg/dL 0.2 0.2 0.1    The ASCVD Risk score ([place]., et al., 2013) failed to calculate for the following reasons:   The valid total cholesterol range is 130 to 320 mg/dL   Patient has failed these meds in past: n/a. Patient is currently at goal on the following medications:  . Atorvastatin 40 mg once daily . Ezetimibe-Simvastatin 10-40 mg once daily  We discussed:  diet and exercise extensively - see HTN.  Plan  Continue current medications.  AFIB   Pulse Readings from Last 3 Encounters:  09/28/19 (!) 57  09/16/19 67  09/01/19 62   Patient is currently rate controlled on the following medications:  . Carvedilol 3.125 mg twice daily   Taking the following  medications for rhythm control:  Flecainide 50 mg tablet - 1.5 tablets twice daily   Prevention of stroke in Afib. CHA2DS2/VAS Stroke Risk Points = 3. Denies any abnormal bruising, bleeding from nose or gums or blood in urine or stool. Patient is currently controlled on the following medications:  . Eliquis 5 mg twice daily   Plan  Continue current medications. 1 month CPA f/u to review patient assistance  eligibility on Eliquis.  Hypothyroidism   Lab Results  Component Value Date/Time   TSH 4.16 09/18/2019 12:51 PM   TSH 4.03 12/09/2017 09:20 AM   FREET4 0.78 06/01/2013 12:37 PM   TSH is currently within range on the following medications:  . Levothyroxine 50 mcg once daily   We discussed:  Proper administration of levothyroxine first thing in AM ~30 min before breakfast.  Plan  Continue current medications.  GERD   Patient denies recent acid reflux. Currently controlled on: . Omeprazole 20 mg once daily   We discussed: Avoidance of potential triggers such as alcohol, carbonated beverages, fatty foods and tomato sauce.  Plan   Continue current medications.  Vaccines   Immunization History  Administered Date(s) Administered  . Influenza Split 06/27/2011, 07/02/2012  . Influenza Whole 09/24/2007, 05/03/2008, 09/28/2009  . Influenza, High Dose Seasonal PF 06/01/2013, 04/20/2020  . Influenza,inj,Quad PF,6+ Mos 06/30/2014, 05/18/2015, 07/02/2016, 06/17/2017  . Influenza-Unspecified 06/09/2018  . PFIZER SARS-COV-2 Vaccination 09/03/2019, 09/25/2019  . Pneumococcal Conjugate-13 03/31/2015  . Pneumococcal Polysaccharide-23 10/25/2008, 07/02/2016  . Td 09/24/2007  . Zoster 05/14/2012   Reviewed and discussed patient's vaccination history.    Plan  Recommended patient receive pfizer covid vaccine booster, shingrix, tetanus booster.   Medication Management / Care Coordination   Receives prescription medications from:  [id number]: [phone]: [phone] [place] [address]Phone: [phone]: [phone]  Denies any issues with current medication management.   Plan  Continue current medication management strategy. ___________________________ SDOH (Social Determinants of Health) assessments performed: Yes.  Future Appointments  Date Time Provider [place] 12/01/2020 10:00 AM [facility] CCM PHARMACIST [facility]  02/14/2021  9:00 AM [facility] HEALTH COACH [facility]   Visit follow-up:  . CPA follow-up: 06/2020 PAP - review eligibility for Eliquis and contact pt to see if he will meet the criteria. .Marland KitchenRPH follow-up: 6 month telephone visit.  JMadelin Rear Pharm.D., BCGP Clinical Pharmacist [facility] ([phone]

## 2020-06-03 NOTE — Patient Instructions (Addendum)
[name],  Thank you for taking the time to meet with me today. [place] hope you [place] Please call the front desk [phone] to have your physical scheduled.   We will review your eligibility for Eliquis patient assistance and contact you with next steps if appropriate. I have included some handouts relevant to high cholesterol and high blood pressure. Please review care plan below and call me at [phone] with any questions!  Thank you, Edyth Gunnels., Clinical Pharmacist  Goals Addressed            This Visit's Progress   . PharmD Care Plan       CARE PLAN ENTRY (see longitudinal plan of care for additional care plan information)  Current Barriers:  . Chronic Disease Management support, education, and care coordination needs related to Hypertension, Hyperlipidemia, and Atrial Fibrillation   Hypertension BP Readings from Last 3 Encounters:  09/28/19 (!) 166/80  09/16/19 (!) 147/81  09/01/19 (!) 160/82   . Pharmacist Clinical Goal(s): o Over the next 180 days, patient will work with PharmD and providers to maintain BP goal <130/80 . Current regimen:  . Amlodipine 10 mg once daily  . Hydralazine 10 mg twice daily . Valsartan-HCTZ 320/12.5 mg once daily  . Interventions: o Diet and exercise recommendations . Patient self care activities - Over the next 180 days, patient will: o Check BP at least once every 1-2 weeks, document, and provide at future appointments o Ensure daily salt intake < 1500 mg/day o Consider using [employer] pool for exercise  Hyperlipidemia Lab Results  Component Value Date/Time   LDLCALC 42 09/18/2019 12:51 PM   LDLDIRECT 123.3 06/01/2013 12:37 PM   . Pharmacist Clinical Goal(s): o Over the next 180 days, patient will work with PharmD and providers to maintain LDL goal < 70 . Current regimen:  o Simvastatin-ezetimibe 40-10 mg once daily o Atorvastatin 40 mg once daily . Interventions: o Diet and exercise  recommendations . Patient self care activities - Over the next 180 days, patient will: o Consider using [employer] pool for exercise  Afib . Pharmacist Clinical Goal(s) o Over the next 180 days, patient will work with PharmD and providers to review accessibility on anticoagulation regimen . Current regimen:  o Eliquis 5 mg twice daily . Interventions: o Review PAP eligibility, help with application process as appropriate . Patient self care activities - Over the next 180 days, patient will: o If needed, please complete patient section of patient assistance application  Medication management . Pharmacist Clinical Goal(s): o Over the next 180 days, patient will work with PharmD and providers to maintain optimal medication adherence . Current pharmacy: [facility] . Interventions o Comprehensive medication review performed. o Continue current medication management strategy . Patient self care activities - Over the next 180 days, patient will: o Take medications as prescribed o Report any questions or concerns to PharmD and/or provider(s) Initial goal documentation.      The patient verbalized understanding of instructions provided today and agreed to receive a mailed copy of patient instruction and/or educational materials. Telephone follow up appointment with pharmacy team member scheduled for: See next appointment with "Care Management Staff" under "What's Next" below.   [name], Pharm.D., BCGP Clinical Pharmacist [facility] [phone] Hypertension, Adult High blood pressure (hypertension) is when the force of blood pumping through the arteries is too strong. The arteries are the blood vessels that carry blood from the heart throughout the body. Hypertension  forces the heart to work harder to pump blood and may cause arteries to become narrow or stiff. Untreated or uncontrolled hypertension can cause a heart attack, heart failure, a stroke, kidney disease, and other  problems. A blood pressure reading consists of a higher number over a lower number. Ideally, your blood pressure should be below 120/80. The first ("top") number is called the systolic pressure. It is a measure of the pressure in your arteries as your heart beats. The second ("bottom") number is called the diastolic pressure. It is a measure of the pressure in your arteries as the heart relaxes. What are the causes? The exact cause of this condition is not known. There are some conditions that result in or are related to high blood pressure. What increases the risk? Some risk factors for high blood pressure are under your control. The following factors may make you more likely to develop this condition:  Smoking.  Having type 2 diabetes mellitus, high cholesterol, or both.  Not getting enough exercise or physical activity.  Being overweight.  Having too much fat, sugar, calories, or salt (sodium) in your diet.  Drinking too much alcohol. Some risk factors for high blood pressure may be difficult or impossible to change. Some of these factors include:  Having chronic kidney disease.  Having a family history of high blood pressure.  Age. Risk increases with age.  Race. You may be at higher risk if you are African American.  Gender. Men are at higher risk than women before age 79. After age 61, women are at higher risk than men.  Having obstructive sleep apnea.  Stress. What are the signs or symptoms? High blood pressure may not cause symptoms. Very high blood pressure (hypertensive crisis) may cause:  Headache.  Anxiety.  Shortness of breath.  Nosebleed.  Nausea and vomiting.  Vision changes.  Severe chest pain.  Seizures. How is this diagnosed? This condition is diagnosed by measuring your blood pressure while you are seated, with your arm resting on a flat surface, your legs uncrossed, and your feet flat on the floor. The cuff of the blood pressure monitor will be  placed directly against the skin of your upper arm at the level of your heart. It should be measured at least twice using the same arm. Certain conditions can cause a difference in blood pressure between your right and left arms. Certain factors can cause blood pressure readings to be lower or higher than normal for a short period of time:  When your blood pressure is higher when you are in a health care provider's office than when you are at home, this is called white coat hypertension. Most people with this condition do not need medicines.  When your blood pressure is higher at home than when you are in a health care provider's office, this is called masked hypertension. Most people with this condition may need medicines to control blood pressure. If you have a high blood pressure reading during one visit or you have normal blood pressure with other risk factors, you may be asked to:  Return on a different day to have your blood pressure checked again.  Monitor your blood pressure at home for 1 week or longer. If you are diagnosed with hypertension, you may have other blood or imaging tests to help your health care provider understand your overall risk for other conditions. How is this treated? This condition is treated by making healthy lifestyle changes, such as eating healthy foods, exercising  more, and reducing your alcohol intake. Your health care provider may prescribe medicine if lifestyle changes are not enough to get your blood pressure under control, and if:  Your systolic blood pressure is above 130.  Your diastolic blood pressure is above 80. Your personal target blood pressure may vary depending on your medical conditions, your age, and other factors. Follow these instructions at home: Eating and drinking   Eat a diet that is high in fiber and potassium, and low in sodium, added sugar, and fat. An example eating plan is called the DASH (Dietary Approaches to Stop Hypertension)  diet. To eat this way: ? Eat plenty of fresh fruits and vegetables. Try to fill one half of your plate at each meal with fruits and vegetables. ? Eat whole grains, such as whole-wheat pasta, brown rice, or whole-grain bread. Fill about one fourth of your plate with whole grains. ? Eat or drink low-fat dairy products, such as skim milk or low-fat yogurt. ? Avoid fatty cuts of meat, processed or cured meats, and poultry with skin. Fill about one fourth of your plate with lean proteins, such as fish, chicken without skin, beans, eggs, or tofu. ? Avoid pre-made and processed foods. These tend to be higher in sodium, added sugar, and fat.  Reduce your daily sodium intake. Most people with hypertension should eat less than 1,500 mg of sodium a day.  Do not drink alcohol if: ? Your health care provider tells you not to drink. ? You are pregnant, may be pregnant, or are planning to become pregnant.  If you drink alcohol: ? Limit how much you use to:  0-1 drink a day for women.  0-2 drinks a day for men. ? Be aware of how much alcohol is in your drink. In the [place] one drink equals one 12 oz bottle of beer (355 mL), one 5 oz glass of wine (148 mL), or one 1 oz glass of hard liquor (44 mL). Lifestyle   Work with your health care provider to maintain a healthy body weight or to lose weight. Ask what an ideal weight is for you.  Get at least 30 minutes of exercise most days of the week. Activities may include walking, swimming, or biking.  Include exercise to strengthen your muscles (resistance exercise), such as Pilates or lifting weights, as part of your weekly exercise routine. Try to do these types of exercises for 30 minutes at least 3 days a week.  Do not use any products that contain nicotine or tobacco, such as cigarettes, e-cigarettes, and chewing tobacco. If you need help quitting, ask your health care provider.  Monitor your blood pressure at home as told by your health care  provider.  Keep all follow-up visits as told by your health care provider. This is important. Medicines  Take over-the-counter and prescription medicines only as told by your health care provider. Follow directions carefully. Blood pressure medicines must be taken as prescribed.  Do not skip doses of blood pressure medicine. Doing this puts you at risk for problems and can make the medicine less effective.  Ask your health care provider about side effects or reactions to medicines that you should watch for. Contact a health care provider if you:  Think you are having a reaction to a medicine you are taking.  Have headaches that keep coming back (recurring).  Feel dizzy.  Have swelling in your ankles.  Have trouble with your vision. Get help right away if you:  Develop a  severe headache or confusion.  Have unusual weakness or numbness.  Feel faint.  Have severe pain in your chest or abdomen.  Vomit repeatedly.  Have trouble breathing. Summary  Hypertension is when the force of blood pumping through your arteries is too strong. If this condition is not controlled, it may put you at risk for serious complications.  Your personal target blood pressure may vary depending on your medical conditions, your age, and other factors. For most people, a normal blood pressure is less than 120/80.  Hypertension is treated with lifestyle changes, medicines, or a combination of both. Lifestyle changes include losing weight, eating a healthy, low-sodium diet, exercising more, and limiting alcohol. This information is not intended to replace advice given to you by your health care provider. Make sure you discuss any questions you have with your health care provider. Document Revised: 04/09/2018 Document Reviewed: 04/09/2018 Elsevier Patient Education  [place].   High Cholesterol  High cholesterol is a condition in which the blood has high levels of a white, waxy, fat-like  substance (cholesterol). The human body needs small amounts of cholesterol. The liver makes all the cholesterol that the body needs. Extra (excess) cholesterol comes from the food that we eat. Cholesterol is carried from the liver by the blood through the blood vessels. If you have high cholesterol, deposits (plaques) may build up on the walls of your blood vessels (arteries). Plaques make the arteries narrower and stiffer. Cholesterol plaques increase your risk for heart attack and stroke. Work with your health care provider to keep your cholesterol levels in a healthy range. What increases the risk? This condition is more likely to develop in people who:  Eat foods that are high in animal fat (saturated fat) or cholesterol.  Are overweight.  Are not getting enough exercise.  Have a family history of high cholesterol. What are the signs or symptoms? There are no symptoms of this condition. How is this diagnosed? This condition may be diagnosed from the results of a blood test.  If you are older than age 16, your health care provider may check your cholesterol every 4-6 years.  You may be checked more often if you already have high cholesterol or other risk factors for heart disease. The blood test for cholesterol measures:  "Bad" cholesterol (LDL cholesterol). This is the main type of cholesterol that causes heart disease. The desired level for LDL is less than 100.  "Good" cholesterol (HDL cholesterol). This type helps to protect against heart disease by cleaning the arteries and carrying the LDL away. The desired level for HDL is 60 or higher.  Triglycerides. These are fats that the body can store or burn for energy. The desired number for triglycerides is lower than 150.  Total cholesterol. This is a measure of the total amount of cholesterol in your blood, including LDL cholesterol, HDL cholesterol, and triglycerides. A healthy number is less than 200. How is this treated? This  condition is treated with diet changes, lifestyle changes, and medicines. Diet changes  This may include eating more whole grains, fruits, vegetables, nuts, and fish.  This may also include cutting back on red meat and foods that have a lot of added sugar. Lifestyle changes  Changes may include getting at least 40 minutes of aerobic exercise 3 times a week. Aerobic exercises include walking, biking, and swimming. Aerobic exercise along with a healthy diet can help you maintain a healthy weight.  Changes may also include quitting smoking. Medicines  Medicines are usually given if diet and lifestyle changes have failed to reduce your cholesterol to healthy levels.  Your health care provider may prescribe a statin medicine. Statin medicines have been shown to reduce cholesterol, which can reduce the risk of heart disease. Follow these instructions at home: Eating and drinking If told by your health care provider:  Eat chicken (without skin), fish, veal, shellfish, ground [place] breast, and round or loin cuts of red meat.  Do not eat fried foods or fatty meats, such as hot dogs and salami.  Eat plenty of fruits, such as apples.  Eat plenty of vegetables, such as broccoli, potatoes, and carrots.  Eat beans, peas, and lentils.  Eat grains such as barley, rice, couscous, and bulgur wheat.  Eat pasta without cream sauces.  Use skim or nonfat milk, and eat low-fat or nonfat yogurt and cheeses.  Do not eat or drink whole milk, cream, ice cream, egg yolks, or hard cheeses.  Do not eat stick margarine or tub margarines that contain trans fats (also called partially hydrogenated oils).  Do not eat saturated tropical oils, such as coconut oil and palm oil.  Do not eat cakes, cookies, crackers, or other baked goods that contain trans fats.  General instructions  Exercise as directed by your health care provider. Increase your activity level with activities such as gardening, walking,  and taking the stairs.  Take over-the-counter and prescription medicines only as told by your health care provider.  Do not use any products that contain nicotine or tobacco, such as cigarettes and e-cigarettes. If you need help quitting, ask your health care provider.  Keep all follow-up visits as told by your health care provider. This is important. Contact a health care provider if:  You are struggling to maintain a healthy diet or weight.  You need help to start on an exercise program.  You need help to stop smoking. Get help right away if:  You have chest pain.  You have trouble breathing. This information is not intended to replace advice given to you by your health care provider. Make sure you discuss any questions you have with your health care provider. Document Revised: 08/02/2017 Document Reviewed: 01/28/2016 Elsevier Patient Education  [place].

## 2020-06-08 ENCOUNTER — Other Ambulatory Visit: Payer: Self-pay | Admitting: Family Medicine

## 2020-06-08 DIAGNOSIS — E785 Hyperlipidemia, unspecified: Secondary | ICD-10-CM

## 2020-07-19 ENCOUNTER — Other Ambulatory Visit: Payer: Self-pay | Admitting: Family Medicine

## 2020-07-20 ENCOUNTER — Other Ambulatory Visit: Payer: Self-pay

## 2020-07-20 ENCOUNTER — Encounter: Payer: Self-pay | Admitting: Family Medicine

## 2020-07-20 MED ORDER — HYDRALAZINE HCL 10 MG PO TABS
10.0000 mg | ORAL_TABLET | Freq: Two times a day (BID) | ORAL | 0 refills | Status: DC
Start: 2020-07-20 — End: 2020-12-23

## 2020-07-20 MED ORDER — VALSARTAN-HYDROCHLOROTHIAZIDE 320-12.5 MG PO TABS
1.0000 | ORAL_TABLET | Freq: Every day | ORAL | 0 refills | Status: DC
Start: 2020-07-20 — End: 2020-12-23

## 2020-07-20 NOTE — Telephone Encounter (Signed)
Patient called back and made an appointment for a physical in January

## 2020-08-11 ENCOUNTER — Other Ambulatory Visit: Payer: Self-pay | Admitting: Cardiology

## 2020-08-31 ENCOUNTER — Other Ambulatory Visit: Payer: Self-pay | Admitting: Family Medicine

## 2020-09-02 ENCOUNTER — Telehealth: Payer: Self-pay

## 2020-09-02 NOTE — Chronic Care Management (AMB) (Signed)
Chronic Care Management Pharmacy Assistant   Name: Stephen Morrison  MRN: [id number] DOB: April 27, 1946  Reason for Encounter: Disease State/ General Adherence Call  PCP : [name], MD  Allergies:   Allergies  Allergen Reactions  . Amoxicillin Swelling and Rash    Tongue swells Has patient had a PCN reaction causing immediate rash, facial/tongue/throat swelling, SOB or lightheadedness with hypotension:  Has patient had a PCN reaction causing severe rash involving mucus membranes or skin necrosis:  Has patient had a PCN reaction that required hospitalization  Has patient had a PCN reaction occurring within the last 10 years: If all of the above answers are "NO", then may proceed with Cephalosporin use.     Medications: Outpatient Encounter Medications as of 09/02/2020  Medication Sig  . acetaminophen (TYLENOL) 650 MG CR tablet Take 650 mg by mouth every 8 (eight) hours as needed for pain.  Marland Kitchen amLODipine (NORVASC) 10 MG tablet Take 1 tablet (10 mg total) by mouth daily.  Marland Kitchen atorvastatin (LIPITOR) 40 MG tablet TAKE 1 TABLET BY MOUTH  DAILY  . carvedilol (COREG) 3.125 MG tablet Take 1 tablet (3.125 mg total) by mouth 2 (two) times daily.  . cetirizine (ZYRTEC) 10 MG tablet Take 10 mg by mouth as needed for allergies.   Marland Kitchen colchicine 0.6 MG tablet Take 1 tablet (0.6 mg total) by mouth 2 (two) times daily as needed (pain). Reported on 10/03/2015  . diclofenac Sodium (VOLTAREN) 1 % GEL Apply 4 g topically 4 (four) times daily.  Marland Kitchen ELIQUIS 5 MG TABS tablet TAKE 1 TABLET BY MOUTH  TWICE DAILY  . ezetimibe-simvastatin (VYTORIN) 10-40 MG tablet TAKE 1 TABLET BY MOUTH  MONDAY, WEDNESDAY, AND  FRIDAY  . flecainide (TAMBOCOR) 50 MG tablet TAKE 1 AND 1/2 TABLETS BY  MOUTH TWICE DAILY  . furosemide (LASIX) 20 MG tablet TAKE 1 TABLET BY MOUTH  DAILY  . gabapentin (NEURONTIN) 300 MG capsule TAKE 1 CAPSULE AT BEDTIME  . glucosamine-chondroitin 500-400 MG tablet Take 1 tablet by mouth 3 (three)  times daily.  . hydrALAZINE (APRESOLINE) 10 MG tablet Take 1 tablet (10 mg total) by mouth 2 (two) times daily.  Marland Kitchen ibuprofen (ADVIL) 200 MG tablet Take 200 mg by mouth every 6 (six) hours as needed.  Marland Kitchen levothyroxine (SYNTHROID) 50 MCG tablet TAKE 1 TABLET BY MOUTH  DAILY  . methocarbamol (ROBAXIN) 500 MG tablet Take 1 tablet (500 mg total) by mouth every 8 (eight) hours as needed for muscle spasms. (Patient not taking: Reported on 02/08/2020)  . omeprazole (PRILOSEC) 20 MG capsule Take 20 mg by mouth daily as needed (acid reflux).  . Thiamine HCl (VITAMIN B-1) 250 MG tablet Take 250 mg by mouth daily. (Patient not taking: Reported on 06/03/2020)  . TURMERIC CURCUMIN PO Take 1,000 mg by mouth daily.  . valsartan-hydrochlorothiazide (DIOVAN-HCT) 320-12.5 MG tablet Take 1 tablet by mouth daily.   No facility-administered encounter medications on file as of 09/02/2020.    Current Diagnosis: Patient Active Problem List   Diagnosis Date Noted  . Urinary retention   . PSA elevation   . GERD (gastroesophageal reflux disease)   . Diverticulosis of colon   . BPH (benign prostatic hyperplasia)   . Atrial fibrillation ([place])   . Arthritis   . Allergy   . Colon cancer screening 05/29/2017  . Chronic anticoagulation 05/29/2017  . Lower leg edema 12/28/2016  . Tick bite 02/25/2016  . Erectile dysfunction 10/04/2015  . Physical exam 03/31/2015  .  Benign prostatic hyperplasia with urinary retention 05/17/2014  . Urinary retention due to benign prostatic hyperplasia 05/12/2014  . Osteoarthritis 04/09/2014  . LIPOMA 03/06/2010  . FATTY LIVER DISEASE 03/06/2010  . Hypothyroidism 10/25/2008  . PROSTATE SPECIFIC ANTIGEN, ELEVATED 03/21/2007  . Hyperlipidemia 02/03/2007  . GOUT 02/03/2007  . Essential hypertension 02/03/2007    Have you seen any other providers since your last visit with [name], Pharm.D., BCGP?  Patient has not seen any other providers since his last visit with [name],  Pharm.D., BCGP.  Have you had any problems recently with your health?  Patient states he has not had any problems recently with his health.  Have you had any problems with your pharmacy?  Patient states he has not had any problems with his pharmacy.  What issues or side effects are you having with your medications?  Patient states he has not had any issues or side effects from his medications.  What would you like me to pass along to [name], Colby.D., BCGP for them to help you with?   Patient states he doesn't have anything for me to pass along at this time. Patient states he has an appointment on Monday 09/05/2020 with [name], MD for his annual physical.  What can we do to take care of you better?  Patient states "we are doing a fine job" and can't not think of anything we can do to improve.  April D Calhoun, [place] Pharmacist Assistant [phone]    Follow-Up:  Pharmacist Review

## 2020-09-05 ENCOUNTER — Ambulatory Visit (INDEPENDENT_AMBULATORY_CARE_PROVIDER_SITE_OTHER): Payer: Medicare Other | Admitting: Family Medicine

## 2020-09-05 ENCOUNTER — Encounter: Payer: Self-pay | Admitting: Family Medicine

## 2020-09-05 ENCOUNTER — Other Ambulatory Visit: Payer: Self-pay

## 2020-09-05 VITALS — BP 138/80 | HR 56 | Temp 98.6°F | Resp 17 | Ht 70.0 in | Wt 222.0 lb

## 2020-09-05 DIAGNOSIS — Z125 Encounter for screening for malignant neoplasm of prostate: Secondary | ICD-10-CM | POA: Diagnosis not present

## 2020-09-05 DIAGNOSIS — Z0000 Encounter for general adult medical examination without abnormal findings: Secondary | ICD-10-CM

## 2020-09-05 DIAGNOSIS — I4891 Unspecified atrial fibrillation: Secondary | ICD-10-CM

## 2020-09-05 DIAGNOSIS — I10 Essential (primary) hypertension: Secondary | ICD-10-CM | POA: Diagnosis not present

## 2020-09-05 LAB — HEPATIC FUNCTION PANEL
ALT: 31 U/L (ref 0–53)
AST: 24 U/L (ref 0–37)
Albumin: 4.8 g/dL (ref 3.5–5.2)
Alkaline Phosphatase: 67 U/L (ref 39–117)
Bilirubin, Direct: 0.2 mg/dL (ref 0.0–0.3)
Total Bilirubin: 1 mg/dL (ref 0.2–1.2)
Total Protein: 7.7 g/dL (ref 6.0–8.3)

## 2020-09-05 LAB — CBC WITH DIFFERENTIAL/PLATELET
Basophils Absolute: 0.1 10*3/uL (ref 0.0–0.1)
Basophils Relative: 1.1 % (ref 0.0–3.0)
Eosinophils Absolute: 0.2 10*3/uL (ref 0.0–0.7)
Eosinophils Relative: 1.8 % (ref 0.0–5.0)
HCT: 49.8 % (ref 39.0–52.0)
Hemoglobin: 17.3 g/dL — ABNORMAL HIGH (ref 13.0–17.0)
Lymphocytes Relative: 26.6 % (ref 12.0–46.0)
Lymphs Abs: 2.4 10*3/uL (ref 0.7–4.0)
MCHC: 34.7 g/dL (ref 30.0–36.0)
MCV: 88.5 fl (ref 78.0–100.0)
Monocytes Absolute: 1 10*3/uL (ref 0.1–1.0)
Monocytes Relative: 10.5 % (ref 3.0–12.0)
Neutro Abs: 5.5 10*3/uL (ref 1.4–7.7)
Neutrophils Relative %: 60 % (ref 43.0–77.0)
Platelets: 366 10*3/uL (ref 150.0–400.0)
RBC: 5.63 Mil/uL (ref 4.22–5.81)
RDW: 13.3 % (ref 11.5–15.5)
WBC: 9.1 10*3/uL (ref 4.0–10.5)

## 2020-09-05 LAB — LIPID PANEL
Cholesterol: 130 mg/dL (ref 0–200)
HDL: 37.7 mg/dL — ABNORMAL LOW (ref >39.00)
LDL Cholesterol: 62 mg/dL (ref 0–99)
NonHDL: 92.15
Total CHOL/HDL Ratio: 3
Triglycerides: 150 mg/dL — ABNORMAL HIGH (ref 0.0–149.0)
VLDL: 30 mg/dL (ref 0.0–40.0)

## 2020-09-05 LAB — BASIC METABOLIC PANEL
BUN: 12 mg/dL (ref 6–23)
CO2: 29 mEq/L (ref 19–32)
Calcium: 9.8 mg/dL (ref 8.4–10.5)
Chloride: 99 mEq/L (ref 96–112)
Creatinine, Ser: 1.05 mg/dL (ref 0.40–1.50)
GFR: 69.06 mL/min (ref >60.00)
Glucose, Bld: 111 mg/dL — ABNORMAL HIGH (ref 70–99)
Potassium: 3.9 mEq/L (ref 3.5–5.1)
Sodium: 138 mEq/L (ref 135–145)

## 2020-09-05 LAB — TSH: TSH: 6.13 u[IU]/mL — ABNORMAL HIGH (ref 0.35–4.50)

## 2020-09-05 LAB — PSA, MEDICARE: PSA: 2.73 ng/ml (ref 0.10–4.00)

## 2020-09-05 NOTE — Assessment & Plan Note (Signed)
Pt's PE WNL w/ exception of obesity.  UTD on colonoscopy, immunizations.  Check labs.  Anticipatory guidance provided.

## 2020-09-05 NOTE — Assessment & Plan Note (Signed)
Chronic problem.  Adequate control today.  Asymptomatic.  Check labs.  No anticipated med changes.  Will follow. 

## 2020-09-05 NOTE — Patient Instructions (Signed)
Follow up in 6 months to recheck BP and cholesterol We'll notify you of your lab results and make any changes if needed Continue to work on healthy diet and regular exercise- you can do it!! Call with any questions or concerns Stay Safe!  Stay Healthy! 

## 2020-09-05 NOTE — Progress Notes (Signed)
   Subjective:    Patient ID: Stephen Morrison, male    DOB: 1946/07/17, 75 y.o.   MRN: [id number]  HPI CPE- UTD on colonoscopy, COVID, flu, PNA.  No concerns  Reviewed past medical, surgical, family and social histories.   Patient Care Team    Relationship Specialty Notifications Start End  [name], MD PCP - General Family Medicine  04/09/14    Comment: Dorette Grate, Ocie Doyne, MD PCP - Cardiology Cardiology Admissions 11/23/15    Comment: Electrophysiologist  [name], MD Consulting Physician Urology  05/17/14   [facility]    02/08/20   [name], [facility] Pharmacist Pharmacist  05/02/20    Comment: [phone]    Health Maintenance  Topic Date Due  . COVID-19 Vaccine (3 - Booster for Pfizer series) 03/24/2020  . TETANUS/TDAP  11/22/2020 (Originally 09/23/2017)  . INFLUENZA VACCINE  Completed  . Hepatitis C Screening  Completed  . PNA vac Low Risk Adult  Completed      Review of Systems Patient reports no vision/hearing changes, anorexia, fever ,adenopathy, persistant/recurrent hoarseness, swallowing issues, chest pain, palpitations, edema, persistant/recurrent cough, hemoptysis, dyspnea (rest,exertional, paroxysmal nocturnal), gastrointestinal  bleeding (melena, rectal bleeding), abdominal pain, excessive heart burn, GU symptoms (dysuria, hematuria, voiding/incontinence issues) syncope, focal weakness, memory loss, numbness & tingling, skin/hair/nail changes, depression, anxiety, abnormal bruising/bleeding, musculoskeletal symptoms/signs.   This visit occurred during the SARS-CoV-2 public health emergency.  Safety protocols were in place, including screening questions prior to the visit, additional usage of staff PPE, and extensive cleaning of exam room while observing appropriate contact time as indicated for disinfecting solutions.       Objective:   Physical Exam General Appearance:    Alert, cooperative, no distress, appears stated  age  Head:    Normocephalic, without obvious abnormality, atraumatic  Eyes:    PERRL, conjunctiva/corneas clear, EOM's intact, fundi    benign, both eyes       Ears:    Normal TM's and external ear canals, both ears  Nose:   Deferred due to COVID  Throat:   Neck:   Supple, symmetrical, trachea midline, no adenopathy;       thyroid:  No enlargement/tenderness/nodules  Back:     Symmetric, no curvature, ROM normal, no CVA tenderness  Lungs:     Clear to auscultation bilaterally, respirations unlabored  Chest wall:    No tenderness or deformity  Heart:    Regular rate and rhythm, S1 and S2 normal, no murmur, rub   or gallop  Abdomen:     Soft, non-tender, bowel sounds active all four quadrants,    no masses, no organomegaly  Genitalia:    deferred  Rectal:    Extremities:   Extremities normal, atraumatic, no cyanosis or edema  Pulses:   2+ and symmetric all extremities  Skin:   Skin color, texture, turgor normal, no rashes or lesions  Lymph nodes:   Cervical, supraclavicular, and axillary nodes normal  Neurologic:   CNII-XII intact. Normal strength, sensation and reflexes      throughout         Assessment & Plan:

## 2020-09-05 NOTE — Assessment & Plan Note (Signed)
Following w/ Dr Curt Bears

## 2020-09-06 ENCOUNTER — Other Ambulatory Visit: Payer: Self-pay

## 2020-09-06 ENCOUNTER — Other Ambulatory Visit: Payer: Self-pay | Admitting: Cardiology

## 2020-09-06 ENCOUNTER — Encounter: Payer: Self-pay | Admitting: Family Medicine

## 2020-09-06 DIAGNOSIS — E038 Other specified hypothyroidism: Secondary | ICD-10-CM

## 2020-09-06 MED ORDER — LEVOTHYROXINE SODIUM 75 MCG PO TABS: 75.0000 ug | ORAL_TABLET | Freq: Every day | ORAL | 3 refills | Status: DC

## 2020-09-06 NOTE — Telephone Encounter (Signed)
Prescription refill request for Eliquis received. Indication:afib Last office visit: Camnitz, 09/28/2019 Scr: 1.05, 09/06/2019 Age: 75 yo  Weight: 100.7 kg   Prescription refill sent.

## 2020-10-03 ENCOUNTER — Other Ambulatory Visit (INDEPENDENT_AMBULATORY_CARE_PROVIDER_SITE_OTHER): Payer: Medicare Other

## 2020-10-03 ENCOUNTER — Other Ambulatory Visit: Payer: Self-pay

## 2020-10-03 DIAGNOSIS — R7989 Other specified abnormal findings of blood chemistry: Secondary | ICD-10-CM | POA: Diagnosis not present

## 2020-10-03 LAB — TSH: TSH: 2.98 u[IU]/mL (ref 0.35–4.50)

## 2020-10-10 NOTE — Telephone Encounter (Signed)
Pt reports that he is completely out.  States [facility] should have him some in next few days. Aware I will place samples at front desk of [facility] office ([facility] office did not have any samples) for him to pick up at his convenience. Pt appreciates the help with this.

## 2020-10-12 ENCOUNTER — Other Ambulatory Visit: Payer: Self-pay | Admitting: Family Medicine

## 2020-10-28 ENCOUNTER — Other Ambulatory Visit: Payer: Self-pay | Admitting: Family Medicine

## 2020-11-02 ENCOUNTER — Telehealth: Payer: Self-pay | Admitting: Family Medicine

## 2020-11-02 NOTE — Telephone Encounter (Signed)
Patient needs to speak to you about his medications.  Please advise

## 2020-11-03 MED ORDER — EZETIMIBE 10 MG PO TABS: 10.0000 mg | ORAL_TABLET | Freq: Every day | ORAL | 1 refills | Status: DC

## 2020-11-03 NOTE — Telephone Encounter (Signed)
Since he is on the Atorvastatin, we will switch the Vytorin to plain Zetia 10mg  daily (the insurance company doesn't like that he's on 2 different statins even though this has been his regimen forever).  So please send a script for Zetia 10mg  daily, #90, 1 refill to [facility] for pt and let him know about the change

## 2020-11-03 NOTE — Addendum Note (Signed)
Addended by: Marian Sorrow on: 11/03/2020 01:25 PM   Modules accepted: Orders

## 2020-11-03 NOTE — Telephone Encounter (Signed)
Spoke with patient in regards to his Rx of vytorin 10-40. Patient states that [facility] will not fill because of a contraindication of another med that he is taking which  he does not know which one. He said that he has run into this problem before. He also states that he is not out of it but did not want any problems when it is time to refill. He has 2 weeks left of the Vytrorin.

## 2020-11-03 NOTE — Telephone Encounter (Signed)
Left message on voicemail to call office. Rx Zetia was sent to [facility] mail order for pt.

## 2020-11-04 ENCOUNTER — Encounter: Payer: Self-pay | Admitting: Family Medicine

## 2020-11-04 ENCOUNTER — Other Ambulatory Visit: Payer: Self-pay

## 2020-11-04 DIAGNOSIS — E785 Hyperlipidemia, unspecified: Secondary | ICD-10-CM

## 2020-11-04 MED ORDER — ATORVASTATIN CALCIUM 40 MG PO TABS: 40.0000 mg | ORAL_TABLET | Freq: Every day | ORAL | 1 refills | Status: DC

## 2020-11-11 ENCOUNTER — Encounter: Payer: Self-pay | Admitting: Family Medicine

## 2020-11-14 NOTE — Telephone Encounter (Signed)
Pt called in checking on this he states that if the medication has been changed that he was unaware of it.  Please call pt back at the home #

## 2020-11-14 NOTE — Telephone Encounter (Signed)
You had refilled pt meds on 11/04/2020 she is stating dose received is different than previous dose, asking if this was intentional please advise.

## 2020-11-23 DIAGNOSIS — L918 Other hypertrophic disorders of the skin: Secondary | ICD-10-CM | POA: Diagnosis not present

## 2020-11-23 DIAGNOSIS — L821 Other seborrheic keratosis: Secondary | ICD-10-CM | POA: Diagnosis not present

## 2020-11-23 DIAGNOSIS — L570 Actinic keratosis: Secondary | ICD-10-CM | POA: Diagnosis not present

## 2020-11-23 DIAGNOSIS — L812 Freckles: Secondary | ICD-10-CM | POA: Diagnosis not present

## 2020-11-23 DIAGNOSIS — D1801 Hemangioma of skin and subcutaneous tissue: Secondary | ICD-10-CM | POA: Diagnosis not present

## 2020-11-29 ENCOUNTER — Other Ambulatory Visit: Payer: Self-pay | Admitting: Cardiology

## 2020-12-01 ENCOUNTER — Ambulatory Visit (INDEPENDENT_AMBULATORY_CARE_PROVIDER_SITE_OTHER): Payer: Medicare Other

## 2020-12-01 DIAGNOSIS — I4891 Unspecified atrial fibrillation: Secondary | ICD-10-CM

## 2020-12-01 DIAGNOSIS — I10 Essential (primary) hypertension: Secondary | ICD-10-CM | POA: Diagnosis not present

## 2020-12-01 DIAGNOSIS — E785 Hyperlipidemia, unspecified: Secondary | ICD-10-CM

## 2020-12-01 NOTE — Progress Notes (Signed)
Chronic Care Management Pharmacy Note  12/01/2020 Name:  Stephen Morrison MRN:  [id number] DOB:  1946/06/17  Subjective: Stephen Morrison is an 75 y.o. year old male who is a primary patient of [name], [name], MD.  The CCM team was consulted for assistance with disease management and care coordination needs.    Engaged with patient by telephone for follow up visit in response to provider referral for pharmacy case management and/or care coordination services.   Consent to Services:  The patient was given information about Chronic Care Management services, agreed to services, and gave verbal consent prior to initiation of services.  Please see initial visit note for detailed documentation.   Patient Care Team: [name], MD as PCP - General (Family Medicine) Constance Haw, MD as PCP - Cardiology (Cardiology) [name], MD as Consulting Physician (Urology) [facility] [name], [facility] as Pharmacist (Pharmacist)  Recent office visits: 09/05/2020 (PCP): Labs look good w/ exception of mildly elevated TSH. We will increase your Levothyroxine to 91mg daily and repeat TSH level at a lab only visit in 1 month. (resolved at 1 month lab tsh 2.98 10/03/2020.)  Hospital visits: None in previous 6 months  Objective: Lab Results  Component Value Date   CREATININE 1.05 09/05/2020   CREATININE 1.11 09/18/2019   CREATININE 0.97 03/13/2019   GFR 69.06 09/05/2020   GFR 64.53 09/18/2019   GFRNONAA 77 03/13/2019   GFRNONAA >60 11/05/2015  Last diabetic Eye exam: No results found for: HMDIABEYEEXA  Last diabetic Foot exam: No results found for: HMDIABFOOTEX  Lab Results  Component Value Date   CHOL 130 09/05/2020   CHOL 109 09/18/2019   TRIG 150.0 (H) 09/05/2020   TRIG 184.0 (H) 09/18/2019   HDL 37.70 (L) 09/05/2020   HDL 29.90 (L) 09/18/2019   CHOLHDL 3 09/05/2020   CHOLHDL 4 09/18/2019   VLDL 30.0 09/05/2020   VLDL 36.8 09/18/2019   LDLCALC  62 09/05/2020   LDLCALC 42 09/18/2019   LDLDIRECT 123.3 06/01/2013   LDLDIRECT 114.2 12/29/2012   Hepatic Function Latest Ref Rng & Units 09/05/2020 09/18/2019 12/09/2017  Total Protein 6.0 - 8.3 g/dL 7.7 7.5 7.0  Albumin 3.5 - 5.2 g/dL 4.8 4.3 4.3  AST 0 - 37 U/L '24 22 22  ' ALT 0 - 53 U/L '31 28 29  ' Alk Phosphatase 39 - 117 U/L 67 66 57  Total Bilirubin 0.2 - 1.2 mg/dL 1.0 0.9 0.9  Bilirubin, Direct 0.0 - 0.3 mg/dL 0.2 0.2 0.2   Lab Results  Component Value Date/Time   TSH 2.98 10/03/2020 08:51 AM   TSH 6.13 (H) 09/05/2020 08:16 AM   FREET4 0.78 06/01/2013 12:37 PM   CBC Latest Ref Rng & Units 09/05/2020 09/18/2019 03/13/2019  WBC 4.0 - 10.5 K/uL 9.1 8.3 7.9  Hemoglobin 13.0 - 17.0 g/dL 17.3(H) 16.4 15.9  Hematocrit 39.0 - 52.0 % 49.8 47.7 45.9  Platelets 150.0 - 400.0 K/uL 366.0 354.0 356   No results found for: VD25OH  Clinical ASCVD:  The 10-year ASCVD risk score (Mikey BussingDC Jr., et al., 2013) is: 32.4%   Values used to calculate the score:     Age: [age]years     Sex: Male     Is Non-Hispanic African American: No     Diabetic: No     Tobacco smoker: No     Systolic Blood Pressure: 1270mmHg     Is BP treated: Yes     HDL Cholesterol:  37.7 mg/dL     Total Cholesterol: 130 mg/dL    Social History   Tobacco Use  Smoking Status Former Smoker  . Packs/day: 1.00  . Years: 10.00  . Pack years: 10.00  . Types: Cigarettes  . Quit date: 08/13/1973  . Years since quitting: 47.3  Smokeless Tobacco Former Systems developer  . Types: Snuff, Chew  . Quit date: 05/12/1974   BP Readings from Last 3 Encounters:  09/05/20 138/80  09/28/19 (!) 166/80  09/16/19 (!) 147/81   Pulse Readings from Last 3 Encounters:  09/05/20 (!) 56  09/28/19 (!) 57  09/16/19 67   Wt Readings from Last 3 Encounters:  09/05/20 222 lb (100.7 kg)  02/08/20 223 lb (101.2 kg)  09/28/19 223 lb (101.2 kg)    Assessment: Review of patient past medical history, allergies, medications, health status, including review of  consultants reports, laboratory and other test data, was performed as part of comprehensive evaluation and provision of chronic care management services.   SDOH:  (Social Determinants of Health) assessments and interventions performed: Yes.  CCM Care Plan Allergies  Allergen Reactions  . Amoxicillin Swelling and Rash    Tongue swells Has patient had a PCN reaction causing immediate rash, facial/tongue/throat swelling, SOB or lightheadedness with hypotension: {YES Has patient had a PCN reaction causing severe rash involving mucus membranes or skin necrosis: {NO Has patient had a PCN reaction that required hospitalization {YES Has patient had a PCN reaction occurring within the last 10 years: {NO If all of the above answers are "NO", then may proceed with Cephalosporin use.    Medications Reviewed Today    Reviewed by [name], [facility] (Pharmacist) on 12/01/20 at 1010  Med List Status: <None>  Medication Order Taking? Sig Documenting Provider Last Dose Status Informant  acetaminophen (TYLENOL) 650 MG CR tablet [id number]  Take 650 mg by mouth every 8 (eight) hours as needed for pain. [provider]  Active   amLODipine (NORVASC) 10 MG tablet [id number] Yes TAKE 1 TABLET BY MOUTH  DAILY [name], MD  Active   atorvastatin (LIPITOR) 40 MG tablet [id number] Yes Take 1 tablet (40 mg total) by mouth daily. [name], MD  Active   carvedilol (COREG) 3.125 MG tablet [id number] Yes TAKE 1 TABLET BY MOUTH  TWICE DAILY [name], MD  Active   cetirizine (ZYRTEC) 10 MG tablet [id number] Yes Take 10 mg by mouth as needed for allergies. [provider]  Active Self  colchicine 0.6 MG tablet [id number] Yes Take 1 tablet (0.6 mg total) by mouth 2 (two) times daily as needed (pain). Reported on 10/03/2015 [name], MD  Active   diclofenac Sodium (VOLTAREN) 1 % GEL [id number]  Apply 4 g topically 4 (four) times daily. [provider]  Active Self   ELIQUIS 5 MG TABS tablet [id number] Yes TAKE 1 TABLET BY MOUTH  TWICE DAILY Camnitz, Will Hassell Done, MD  Active   ezetimibe (ZETIA) 10 MG tablet [id number] Yes Take 1 tablet (10 mg total) by mouth daily. [name], MD  Active   ezetimibe-simvastatin (VYTORIN) 10-40 MG tablet [id number] Yes TAKE 1 TABLET BY MOUTH  MONDAY, WEDNESDAY, AND  FRIDAY [name], MD  Active   flecainide (TAMBOCOR) 50 MG tablet [id number] Yes TAKE 1 AND 1/2 TABLETS BY  MOUTH TWICE DAILY Camnitz, Will Hassell Done, MD  Active   furosemide (LASIX) 20 MG tablet [id number] Yes TAKE 1 TABLET BY MOUTH  DAILY [name], MD  Active   gabapentin (NEURONTIN) 300 MG capsule [id number] Yes TAKE 1 CAPSULE AT BEDTIME [name], MD  Active   glucosamine-chondroitin 500-400 MG tablet [id number] Yes Take 1 tablet by mouth 3 (three) times daily. [provider]  Active   hydrALAZINE (APRESOLINE) 10 MG tablet [id number] Yes Take 1 tablet (10 mg total) by mouth 2 (two) times daily. [name], MD  Active   ibuprofen (ADVIL) 200 MG tablet [id number] Yes Take 200 mg by mouth every 6 (six) hours as needed. [provider]  Active   levothyroxine (SYNTHROID) 75 MCG tablet [id number] Yes Take 1 tablet (75 mcg total) by mouth daily. [name], MD  Active         Discontinued 12/01/20 1009 (Patient Preference)   omeprazole (PRILOSEC) 20 MG capsule [id number] Yes Take 20 mg by mouth daily as needed (acid reflux). [provider]  Active Self  Thiamine HCl (VITAMIN B-1) 250 MG tablet [id number] Yes Take 250 mg by mouth daily. [provider]  Active   TURMERIC CURCUMIN PO [id number] Yes Take 1,000 mg by mouth daily. [provider]  Active   valsartan-hydrochlorothiazide (DIOVAN-HCT) 320-12.5 MG tablet [id number] Yes Take 1 tablet by mouth daily. [name], MD  Active          Patient Active Problem List   Diagnosis Date Noted  . Urinary retention   . GERD  (gastroesophageal reflux disease)   . Diverticulosis of colon   . BPH (benign prostatic hyperplasia)   . Atrial fibrillation ([place])   . Arthritis   . Allergy   . Colon cancer screening 05/29/2017  . Chronic anticoagulation 05/29/2017  . Lower leg edema 12/28/2016  . Tick bite 02/25/2016  . Erectile dysfunction 10/04/2015  . Physical exam 03/31/2015  . Benign prostatic hyperplasia with urinary retention 05/17/2014  . Urinary retention due to benign prostatic hyperplasia 05/12/2014  . Osteoarthritis 04/09/2014  . LIPOMA 03/06/2010  . FATTY LIVER DISEASE 03/06/2010  . Hypothyroidism 10/25/2008  . Hyperlipidemia 02/03/2007  . GOUT 02/03/2007  . Essential hypertension 02/03/2007   Immunization History  Administered Date(s) Administered  . Influenza Split 06/27/2011, 07/02/2012  . Influenza Whole 09/24/2007, 05/03/2008, 09/28/2009  . Influenza, High Dose Seasonal PF 06/01/2013, 04/20/2020  . Influenza,inj,Quad PF,6+ Mos 06/30/2014, 05/18/2015, 07/02/2016, 06/17/2017  . Influenza-Unspecified 06/09/2018  . PFIZER(Purple Top)SARS-COV-2 Vaccination 09/03/2019, 09/25/2019, 07/19/2020  . Pneumococcal Conjugate-13 03/31/2015  . Pneumococcal Polysaccharide-23 10/25/2008, 07/02/2016  . Td 09/24/2007  . Zoster 05/14/2012    Conditions to be addressed/monitored: Afib, HTN, hypothyroidism, HLD, GERD, BPH, lower leg edema, ED, OA, fatty liver, gout   Care Plan : [place]  Updates made by [name], [facility] since 12/01/2020 12:00 AM    Problem: Afib, HTN, hypothyroidism, HLD, GERD, BPH, lower leg edema, ED, OA, fatty liver, gout   Priority: High    Long-Range Goal: Disease Management   Start Date: 12/01/2020  Expected End Date: 12/01/2021  This Visit's Progress: On track  Priority: High  Note:    Pharmacist Clinical Goal(s):  Marland Kitchen Over the next 365 days, patient will contact provider office for questions/concerns as evidenced notation of same in electronic health record through  collaboration with PharmD and provider.   Interventions: . 1:1 collaboration with [name], MD regarding development and update of comprehensive plan of care as evidenced by provider attestation and co-signature . Inter-disciplinary care team collaboration (see longitudinal plan of care) . Comprehensive medication review performed; medication list updated in  electronic medical record . No Changes   Hypertension (BP goal <140/90) -Not ideally controlled -Current home readings: at goal per patient, no specific readings provided. Tendency to run high in office. Last OV BP 08/2020 was <140/90. -Current treatment (Dr Birdie Riddle): Marland Kitchen Carvedilol 3.125 mg twice daily  . Amlodipine 10 mg once daily  . Furosemide 20 mg daily  . Hydralazine 10 mg once daily . Valsartan-HCTZ 320-12.5 mg once daily   -Previous welling while taking amlodipine 10 mg (resolved). Denies hypotensive/hypertensive symptoms. -Reviewed Med Hx - no fill gaps noted -Current dietary habits: lots of vegetables, pork, avoids processed foods. Current exercise habits: golfing about 2x/week  -Counseled to monitor BP at home 1-2x/month, document, and provide log at future appointments -Recommended to continue current medication  Hyperlipidemia: (LDL goal < 70) -Controlled -Current treatment: . Atorvastatin 40 mg once daily (Dr Birdie Riddle) . Zetia 10 mg TIW (Dr Birdie Riddle) -Previously on ezetimibe-simvastatin 10-40 mg TIW (insurance issues due to being on atorvastatin already 10/2020) -Tolerating well, no side effects or concerns -Reviewed Med Hx - no fill gaps noted -Educated on Cholesterol goals;  -Recommended to continue current medication  Atrial Fibrillation (Goal: prevent stroke and major bleeding) -Controlled -Current treatment: . Rate control: on carvedilol. Rhythm: flecainide 75 mg twice daily (Dr Curt Bears) . Anticoagulation: Eliquis 5 mg twice daily (Dr Curt Bears) -Medications previously tried: Xarelto (increased  bleeding) -Reviewed side effects - no issues with energy/BP/HR or bleed. No financial concerns reported.  -Recommended to continue current medication  Patient Goals/Self-Care Activities . Over the next 365 days, patient will:  - take medications as prescribed target a minimum of 150 minutes of moderate intensity exercise weekly  Medication Assistance: None required.  Patient affirms current coverage meets needs.  Patient's preferred pharmacy is:  [id number] [place] [address]Phone: [phone]: [address] [place]Phone: [phone]: [phone] Up:  Patient agrees to Care Plan and Follow-up. [facility]     Future Appointments  Date Time Provider DAllen 01/06/2021 11:30 AM CConstance Haw MD [facility]  02/14/2021  9:00 AM [facility] HEALTH COACH [facility]  06/16/2021  2:00 PM [facility] CCM PHARMACIST [facility]   JMadelin Rear Pharm.D., BCGP Clinical Pharmacist [facility] [place]([phone]

## 2020-12-01 NOTE — Patient Instructions (Signed)
Stephen Morrison,  Thank you for talking with me today. I have included our care plan/goals in the following pages.   Please review and call me at [phone] with any questions.  Thanks! Ellin Mayhew, Pharm.D., BCGP Clinical Pharmacist [facility] [place] [phone] Patient Care Plan: [place] Plan    Problem Identified: Afib, HTN, hypothyroidism, HLD, GERD, BPH, lower leg edema, ED, OA, fatty liver, gout   Priority: High    Long-Range Goal: Disease Management   Start Date: 12/01/2020  Expected End Date: 12/01/2021  This Visit's Progress: On track  Priority: High  Note:    Pharmacist Clinical Goal(s):  Marland Kitchen Over the next 365 days, patient will contact provider office for questions/concerns as evidenced notation of same in electronic health record through collaboration with PharmD and provider.   Interventions: . 1:1 collaboration with [name], MD regarding development and update of comprehensive plan of care as evidenced by provider attestation and co-signature . Inter-disciplinary care team collaboration (see longitudinal plan of care) . Comprehensive medication review performed; medication list updated in electronic medical record . No Changes   Hypertension (BP goal <140/90) -Not ideally controlled -Current home readings: at goal per patient, no specific readings provided. Tendency to run high in office. Last OV BP 08/2020 was <140/90. -Current treatment (Dr Birdie Riddle): Marland Kitchen Carvedilol 3.125 mg twice daily  . Amlodipine 10 mg once daily  . Furosemide 20 mg daily  . Hydralazine 10 mg once daily . Valsartan-HCTZ 320-12.5 mg once daily   -Previous welling while taking amlodipine 10 mg (resolved). Denies hypotensive/hypertensive symptoms. -Reviewed Med Hx - no fill gaps noted -Current dietary habits: lots of vegetables, pork, avoids processed foods. Current exercise habits: golfing about 2x/week  -Counseled to  monitor BP at home 1-2x/month, document, and provide log at future appointments -Recommended to continue current medication  Hyperlipidemia: (LDL goal < 70) -Controlled -Current treatment: . Atorvastatin 40 mg once daily (Dr Birdie Riddle) . Zetia 10 mg TIW (Dr Birdie Riddle) -Previously on ezetimibe-simvastatin 10-40 mg TIW (insurance issues due to being on atorvastatin already 10/2020) -Tolerating well, no side effects or concerns -Reviewed Med Hx - no fill gaps noted -Educated on Cholesterol goals;  -Recommended to continue current medication  Atrial Fibrillation (Goal: prevent stroke and major bleeding) -Controlled -Current treatment: . Rate control: on carvedilol. Rhythm: flecainide 75 mg twice daily (Dr Curt Bears) . Anticoagulation: Eliquis 5 mg twice daily (Dr Curt Bears) -Medications previously tried: Xarelto (increased bleeding) -Reviewed side effects - no issues with energy/BP/HR or bleed. No financial concerns reported.  -Recommended to continue current medication  Patient Goals/Self-Care Activities . Over the next 365 days, patient will:  - take medications as prescribed target a minimum of 150 minutes of moderate intensity exercise weekly  Medication Assistance: None required.  Patient affirms current coverage meets needs.  Patient's preferred pharmacy [facility]:  [id number] [place], [address] [place] [address] Phone: [phone] Fax: [address], [address], [address] [place], [address] [place] [address] Phone: [phone] Fax: [phone] Follow Up:  Patient agrees to Care Plan and Follow-up. Plan: [place] f/u telephone visit 6 months      The patient verbalized understanding of instructions provided today and agreed to receive a MyChart copy of patient instruction and/or educational materials. Telephone follow up appointment with pharmacy team member scheduled for:  See next appointment with "  Care Management Staff" under "What's Next" below.

## 2020-12-23 ENCOUNTER — Other Ambulatory Visit: Payer: Self-pay | Admitting: Family Medicine

## 2021-01-02 ENCOUNTER — Ambulatory Visit: Payer: Medicare Other | Admitting: Cardiology

## 2021-01-02 ENCOUNTER — Other Ambulatory Visit: Payer: Self-pay

## 2021-01-02 ENCOUNTER — Encounter: Payer: Self-pay | Admitting: Cardiology

## 2021-01-02 VITALS — BP 144/66 | HR 59 | Ht 70.5 in | Wt 216.0 lb

## 2021-01-02 DIAGNOSIS — I480 Paroxysmal atrial fibrillation: Secondary | ICD-10-CM | POA: Diagnosis not present

## 2021-01-02 NOTE — Progress Notes (Signed)
Electrophysiology Office Note   Date:  01/02/2021   ID:  Stephen Morrison, Stephen Morrison [birth year], MRN [id number]  PCP:  Stephen Minium, MD  Primary Electrophysiologist:  Stephen Haw, MD    No chief complaint on file.    History of Present Illness: Stephen Morrison is a 75 y.o. male who presents today for electrophysiology evaluation.     He has a history of hypertension, hyperlipidemia, and atrial fibrillation.  He presented to the emergency room with an hour of palpitations.  Palpitations woke him from sleep.  He converted to sinus rhythm in the emergency room.  He is now on flecainide.  Today, denies symptoms of palpitations, chest pain, shortness of breath, orthopnea, PND, lower extremity edema, claudication, dizziness, presyncope, syncope, bleeding, or neurologic sequela. The patient is tolerating medications without difficulties.  Since last being seen he has done well.  He has had no further episodes of atrial fibrillation.  He is able to do his daily activities and is without restriction.  He is tolerating his medications without issue.   Past Medical History:  Diagnosis Date  . Allergy   . Arthritis   . Atrial fibrillation ([place])    one time incident, on medication now with no further problems  . BPH (benign prostatic hyperplasia)   . Diverticulosis of colon   . GERD (gastroesophageal reflux disease)   . Gout    STABLE PER PT ON 05-12-2014  . Hyperlipidemia   . Hypertension   . Hypothyroidism   . PSA elevation   . Tick bite 02/25/2016  . Urinary retention    Past Surgical History:  Procedure Laterality Date  . COLONOSCOPY  01-31-2007  . EXCISION BENIGN CYST POSTERIOR NECK  2010  . INSERTION OF SUPRAPUBIC CATHETER N/A 05/17/2014   Procedure: INSERTION OF SUPRAPUBIC CATHETER;  Surgeon: Bernestine Amass, MD;  Location: [facility];  Service: Urology;  Laterality: N/A;  . ORIF RIGHT WRIST FX  2009  . TRANSURETHRAL RESECTION OF PROSTATE N/A 05/17/2014    Procedure: TRANSURETHRAL RESECTION OF THE PROSTATE WITH GYRUS INSTRUMENTS;  Surgeon: Bernestine Amass, MD;  [facility];  Service: Urology;  Laterality: N/A;  . VASECTOMY       Current Outpatient Medications  Medication Sig Dispense Refill  . acetaminophen (TYLENOL) 650 MG CR tablet Take 650 mg by mouth every 8 (eight) hours as needed for pain.    Marland Kitchen amLODipine (NORVASC) 10 MG tablet TAKE 1 TABLET BY MOUTH  DAILY 90 tablet 3  . atorvastatin (LIPITOR) 40 MG tablet Take 1 tablet (40 mg total) by mouth daily. 90 tablet 1  . carvedilol (COREG) 3.125 MG tablet TAKE 1 TABLET BY MOUTH  TWICE DAILY 180 tablet 1  . cetirizine (ZYRTEC) 10 MG tablet Take 10 mg by mouth as needed for allergies.    Marland Kitchen colchicine 0.6 MG tablet Take 1 tablet (0.6 mg total) by mouth 2 (two) times daily as needed (pain). Reported on 10/03/2015 30 tablet 3  . diclofenac Sodium (VOLTAREN) 1 % GEL Apply 4 g topically 4 (four) times daily.    Marland Kitchen ELIQUIS 5 MG TABS tablet TAKE 1 TABLET BY MOUTH  TWICE DAILY 180 tablet 1  . ezetimibe (ZETIA) 10 MG tablet Take 1 tablet (10 mg total) by mouth daily. 90 tablet 1  . ezetimibe-simvastatin (VYTORIN) 10-40 MG tablet TAKE 1 TABLET BY MOUTH  MONDAY, WEDNESDAY, AND  FRIDAY (Patient taking differently: TAKE 1 TABLET BY MOUTH  MONDAY, WEDNESDAY,  AND  FRIDAY) 39 tablet 3  . flecainide (TAMBOCOR) 50 MG tablet TAKE 1 AND 1/2 TABLETS BY  MOUTH TWICE DAILY 270 tablet 0  . furosemide (LASIX) 20 MG tablet TAKE 1 TABLET BY MOUTH  DAILY 90 tablet 3  . gabapentin (NEURONTIN) 300 MG capsule TAKE 1 CAPSULE AT BEDTIME 90 capsule 1  . glucosamine-chondroitin 500-400 MG tablet Take 1 tablet by mouth 3 (three) times daily.    . hydrALAZINE (APRESOLINE) 10 MG tablet TAKE 1 TABLET BY MOUTH  TWICE DAILY 180 tablet 1  . ibuprofen (ADVIL) 200 MG tablet Take 200 mg by mouth every 6 (six) hours as needed.    Marland Kitchen levothyroxine (SYNTHROID) 75 MCG tablet Take 1 tablet (75 mcg total) by mouth daily. 90  tablet 3  . omeprazole (PRILOSEC) 20 MG capsule Take 20 mg by mouth daily as needed (acid reflux).    . Thiamine HCl (VITAMIN B-1) 250 MG tablet Take 250 mg by mouth daily.    . TURMERIC CURCUMIN PO Take 1,000 mg by mouth daily.    . valsartan-hydrochlorothiazide (DIOVAN-HCT) 320-12.5 MG tablet TAKE 1 TABLET BY MOUTH  DAILY 90 tablet 1   No current facility-administered medications for this visit.    Allergies:   Amoxicillin   Social History:  The patient  reports that he quit smoking about 47 years ago. His smoking use included cigarettes. He has a 10.00 pack-year smoking history. He quit smokeless tobacco use about 46 years ago.  His smokeless tobacco use included snuff and chew. He reports current alcohol use. He reports that he does not use drugs.   Family History:  The patient's family history includes Alcohol abuse in his father; Dementia in his father; Esophageal varices in his father; Hyperlipidemia in his mother; Hypertension in his mother.  ROS:  Please see the history of present illness.   Otherwise, review of systems is positive for none.   All other systems are reviewed and negative.   PHYSICAL EXAM: VS:  BP (!) 144/66   Pulse (!) 59   Ht 5' 10.5" (1.791 m)   Wt 216 lb (98 kg)   BMI 30.55 kg/m  , BMI Body mass index is 30.55 kg/m. GEN: Well nourished, well developed, in no acute distress  HEENT: normal  Neck: no JVD, carotid bruits, or masses Cardiac: RRR; no murmurs, rubs, or gallops,no edema  Respiratory:  clear to auscultation bilaterally, normal work of breathing GI: soft, nontender, nondistended, + BS MS: no deformity or atrophy  Skin: warm and dry Neuro:  Strength and sensation are intact Psych: euthymic mood, full affect  EKG:  EKG is ordered today. Personal review of the ekg ordered shows sinus rhythm   Recent Labs: 09/05/2020: ALT 31; BUN 12; Creatinine, Ser 1.05; Hemoglobin 17.3; Platelets 366.0; Potassium 3.9; Sodium 138 10/03/2020: TSH 2.98    Lipid  Panel     Component Value Date/Time   CHOL 130 09/05/2020 0816   TRIG 150.0 (H) 09/05/2020 0816   HDL 37.70 (L) 09/05/2020 0816   CHOLHDL 3 09/05/2020 0816   VLDL 30.0 09/05/2020 0816   LDLCALC 62 09/05/2020 0816   LDLDIRECT 123.3 06/01/2013 1237     Wt Readings from Last 3 Encounters:  01/02/21 216 lb (98 kg)  09/05/20 222 lb (100.7 kg)  02/08/20 223 lb (101.2 kg)      Other studies Reviewed: Additional studies/ records that were reviewed today include:  TTE 12/09/15 - Left ventricle: The cavity size was normal. Wall thickness was  increased in a pattern of mild LVH. Systolic function was normal.  The estimated ejection fraction was in the range of 60% to 65%.  Wall motion was normal; there were no regional wall motion  abnormalities.    ASSESSMENT AND PLAN:  1.  Paroxysmal atrial fibrillation: Currently on Xarelto, carvedilol, flecainide.  High risk medication monitoring.  CHA2DS2-VASc of 3.  He is fortunately remained in sinus rhythm.  He continues to do well and is without complaint.  No changes at this time.  2.  Hypertension: Elevated today but usually well controlled.  No changes.  Current medicines are reviewed at length with the patient today.   The patient does not have concerns regarding his medicines.  The following changes were made today: None  Labs/ tests ordered today include:  Orders Placed This Encounter  Procedures  . EKG 12-Lead     Disposition:   FU with Stephen Morrison 12 months  Signed, Lamar Meter Meredith Leeds, MD  01/02/2021 9:34 AM     [facility] [address] [place] [address] [phone] (office) [phone] (fax)

## 2021-01-02 NOTE — Patient Instructions (Signed)
Medication Instructions:  °Your physician recommends that you continue on your current medications as directed. Please refer to the Current Medication list given to you today. ° °*If you need a refill on your cardiac medications before your next appointment, please call your pharmacy* ° ° °Lab Work: °None ordered ° ° °Testing/Procedures: °None ordered ° ° °Follow-Up: °At [facility], you and your health needs are our priority.  As part of our continuing mission to provide you with exceptional heart care, we have created designated Provider Care Teams.  These Care Teams include your primary Cardiologist (physician) and Advanced Practice Providers (APPs -  Physician Assistants and Nurse Practitioners) who all work together to provide you with the care you need, when you need it. ° °Your next appointment:   °1 year(s) ° °The format for your next appointment:   °In Person ° °Provider:   °Will Camnitz, MD ° ° ° °Thank you for choosing [facility]!! ° ° °Maleena Eddleman, RN °[phone] ° ° ° °

## 2021-01-06 ENCOUNTER — Ambulatory Visit: Payer: Medicare Other | Admitting: Cardiology

## 2021-02-08 ENCOUNTER — Encounter: Payer: Self-pay | Admitting: *Deleted

## 2021-02-14 ENCOUNTER — Ambulatory Visit: Payer: Medicare Other

## 2021-02-14 ENCOUNTER — Encounter: Payer: Self-pay | Admitting: Family Medicine

## 2021-02-15 ENCOUNTER — Other Ambulatory Visit: Payer: Self-pay

## 2021-02-15 DIAGNOSIS — E038 Other specified hypothyroidism: Secondary | ICD-10-CM

## 2021-02-15 MED ORDER — LEVOTHYROXINE SODIUM 75 MCG PO TABS: 75.0000 ug | ORAL_TABLET | Freq: Every day | ORAL | 1 refills | Status: DC

## 2021-02-24 ENCOUNTER — Other Ambulatory Visit: Payer: Self-pay | Admitting: Cardiology

## 2021-02-24 NOTE — Telephone Encounter (Signed)
Pt's age 75, wt 8 kg, SCr 1.05, CrCl 82.96, last ov w/ WC 04/04/21.

## 2021-02-27 ENCOUNTER — Telehealth: Payer: Self-pay

## 2021-02-27 ENCOUNTER — Ambulatory Visit (INDEPENDENT_AMBULATORY_CARE_PROVIDER_SITE_OTHER): Payer: Medicare Other | Admitting: *Deleted

## 2021-02-27 DIAGNOSIS — Z0000 Encounter for general adult medical examination without abnormal findings: Secondary | ICD-10-CM | POA: Diagnosis not present

## 2021-02-27 NOTE — Progress Notes (Signed)
Subjective:   Stephen Morrison is a 75 y.o. male who presents for Medicare Annual/Subsequent preventive examination.  I connected with  [name] on 02/27/21 by a video enabled telemedicine application and verified that I am speaking with the correct person using two identifiers.   I discussed the limitations of evaluation and management by telemedicine. The patient expressed understanding and agreed to proceed.  Patient location: home  Provider location: Tele-Health Visit    Review of Systems    NA Cardiac Risk Factors include: advanced age (>94men, >33 women);hypertension;male gender;dyslipidemia     Objective:    Today's Vitals   There is no height or weight on file to calculate BMI.  Advanced Directives 02/08/2020 07/03/2017 06/13/2017 11/05/2015 05/17/2014 05/05/2014 05/04/2014  Does Patient Have a Medical Advance Directive? Yes Yes Yes Yes Yes No No  Type of Paramedic of [place];Living will [place];Living will Healthcare Power of [place] Living will [place];Living will - -  Copy of [place] in Chart? No - copy requested No - copy requested - - - - -  Would patient like information on creating a medical advance directive? - - - - - No - patient declined information No - patient declined information    Current Medications (verified) Outpatient Encounter Medications as of 02/27/2021  Medication Sig   acetaminophen (TYLENOL) 650 MG CR tablet Take 650 mg by mouth every 8 (eight) hours as needed for pain.   amLODipine (NORVASC) 10 MG tablet TAKE 1 TABLET BY MOUTH  DAILY   apixaban (ELIQUIS) 5 MG TABS tablet TAKE 1 TABLET BY MOUTH  TWICE DAILY   atorvastatin (LIPITOR) 40 MG tablet Take 1 tablet (40 mg total) by mouth daily.   carvedilol (COREG) 3.125 MG tablet TAKE 1 TABLET BY MOUTH  TWICE DAILY   cetirizine (ZYRTEC) 10 MG tablet Take 10 mg by mouth as needed for allergies.   colchicine 0.6 MG  tablet Take 1 tablet (0.6 mg total) by mouth 2 (two) times daily as needed (pain). Reported on 10/03/2015   diclofenac Sodium (VOLTAREN) 1 % GEL Apply 4 g topically 4 (four) times daily.   ezetimibe (ZETIA) 10 MG tablet Take 1 tablet (10 mg total) by mouth daily.   flecainide (TAMBOCOR) 50 MG tablet TAKE 1 AND 1/2 TABLETS BY  MOUTH TWICE DAILY   furosemide (LASIX) 20 MG tablet TAKE 1 TABLET BY MOUTH  DAILY   gabapentin (NEURONTIN) 300 MG capsule TAKE 1 CAPSULE AT BEDTIME   glucosamine-chondroitin 500-400 MG tablet Take 1 tablet by mouth 3 (three) times daily.   hydrALAZINE (APRESOLINE) 10 MG tablet TAKE 1 TABLET BY MOUTH  TWICE DAILY   ibuprofen (ADVIL) 200 MG tablet Take 200 mg by mouth every 6 (six) hours as needed.   levothyroxine (SYNTHROID) 75 MCG tablet Take 1 tablet (75 mcg total) by mouth daily.   omeprazole (PRILOSEC) 20 MG capsule Take 20 mg by mouth daily as needed (acid reflux).   Thiamine HCl (VITAMIN B-1) 250 MG tablet Take 250 mg by mouth daily.   TURMERIC CURCUMIN PO Take 1,000 mg by mouth daily.   valsartan-hydrochlorothiazide (DIOVAN-HCT) 320-12.5 MG tablet TAKE 1 TABLET BY MOUTH  DAILY   ezetimibe-simvastatin (VYTORIN) 10-40 MG tablet TAKE 1 TABLET BY MOUTH  MONDAY, WEDNESDAY, AND  FRIDAY (Patient not taking: Reported on 02/27/2021)   No facility-administered encounter medications on file as of 02/27/2021.    Allergies (verified) Amoxicillin   History: Past  Medical History:  Diagnosis Date   Allergy    Arthritis    Atrial fibrillation ([place])    one time incident, on medication now with no further problems   BPH (benign prostatic hyperplasia)    Diverticulosis of colon    GERD (gastroesophageal reflux disease)    Gout    STABLE PER PT ON 05-12-2014   Hyperlipidemia    Hypertension    Hypothyroidism    PSA elevation    Tick bite 02/25/2016   Urinary retention    Past Surgical History:  Procedure Laterality Date   COLONOSCOPY  01-31-2007   EXCISION BENIGN CYST  POSTERIOR NECK  2010   INSERTION OF SUPRAPUBIC CATHETER N/A 05/17/2014   Procedure: INSERTION OF SUPRAPUBIC CATHETER;  Surgeon: Bernestine Amass, MD;  [facility];  Service: Urology;  Laterality: N/A;   ORIF RIGHT WRIST FX  2009   TRANSURETHRAL RESECTION OF PROSTATE N/A 05/17/2014   Procedure: TRANSURETHRAL RESECTION OF THE PROSTATE WITH GYRUS INSTRUMENTS;  Surgeon: Bernestine Amass, MD;  Location: [facility];  Service: Urology;  Laterality: N/A;   VASECTOMY     Family History  Problem Relation Age of Onset   Alcohol abuse Father    Dementia Father    Esophageal varices Father    Hyperlipidemia Mother    Hypertension Mother    Colon cancer Neg Hx    Esophageal cancer Neg Hx    Stomach cancer Neg Hx    Rectal cancer Neg Hx    Social History   Socioeconomic History   Marital status: Married    Spouse name: Not on file   Number of children: Not on file   Years of education: Not on file   Highest education level: Not on file  Occupational History   Occupation: retired  Tobacco Use   Smoking status: Former    Packs/day: 1.00    Years: 10.00    Pack years: 10.00    Types: Cigarettes    Quit date: 08/13/1973    Years since quitting: 47.5   Smokeless tobacco: Former    Types: Snuff, Chew    Quit date: 05/12/1974  Vaping Use   Vaping Use: Never used  Substance and Sexual Activity   Alcohol use: Yes    Alcohol/week: 0.0 standard drinks    Comment: OCCASIONALLY   Drug use: No   Sexual activity: Not on file  Other Topics Concern   Not on file  Social History Narrative   Not on file   Social Determinants of Health   Financial Resource Strain: Low Risk    Difficulty of Paying Living Expenses: Not hard at all  Food Insecurity: No Food Insecurity   Worried About Charity fundraiser in the Last Year: Never true   [place] in the Last Year: Never true  Transportation Needs: No Transportation Needs   Lack of Transportation (Medical):  No   Lack of Transportation (Non-Medical): No  Physical Activity: Insufficiently Active   Days of Exercise per Week: 2 days   Minutes of Exercise per Session: 60 min  Stress: No Stress Concern Present   Feeling of Stress : Not at all  Social Connections: Moderately Integrated   Frequency of Communication with Friends and Family: More than three times a week   Frequency of Social Gatherings with Friends and Family: More than three times a week   Attends Religious Services: Never   Marine scientist or Organizations: Yes  Attends Music therapist: More than 4 times per year   Marital Status: Married    Tobacco Counseling Counseling given: Not Answered   Clinical Intake:  Pre-visit preparation completed: Yes  Pain : No/denies pain     Nutritional Risks: None Diabetes: No  How often do you need to have someone help you when you read instructions, pamphlets, or other written materials from your doctor or pharmacy?: 1 - Never  Diabetic?  NO Comments: Leroy Kennedy LPN   Activities of Daily Living In your present state of health, do you have any difficulty performing the following activities: 02/27/2021 09/05/2020  Hearing? N N  Vision? N N  Difficulty concentrating or making decisions? N N  Walking or climbing stairs? N N  Dressing or bathing? N N  Doing errands, shopping? N N  Preparing Food and eating ? N -  Using the Toilet? N -  In the past six months, have you accidently leaked urine? N -  Do you have problems with loss of bowel control? N -  Managing your Medications? N -  Managing your Finances? N -  Some recent data might be hidden    Patient Care Team: [name], MD as PCP - General (Family Medicine) Constance Haw, MD as PCP - Cardiology (Cardiology) [name], MD (Inactive) as Consulting Physician (Urology) [facility] [name], [facility] as Pharmacist (Pharmacist)  Indicate any recent  Medical Services you may have received from other than [facility] providers in the past year (date may be approximate).     Assessment:   This is a routine wellness examination for Sylvain.  Hearing/Vision screen Hearing Screening - Comments:: Does were one of his wifes hearing aid. Has never bee prescribed himself Vision Screening - Comments:: Will call to schedule  Dr, Candise Bowens  Dietary issues and exercise activities discussed: Current Exercise Habits: The patient does not participate in regular exercise at present;Home exercise routine, Time (Minutes): 60, Frequency (Times/Week): 2, Weekly Exercise (Minutes/Week): 120, Intensity: Moderate   Goals Addressed             This Visit's Progress    Weight (lb) < 200 lb (90.7 kg)         Depression Screen PHQ 2/9 Scores 02/27/2021 09/05/2020 02/08/2020 09/16/2019 08/19/2017 07/03/2017 07/03/2017  PHQ - 2 Score 0 0 0 0 0 0 0  PHQ- 9 Score - 0 - 0 0 - 0  Exception Documentation - - - - - - -    Fall Risk Fall Risk  02/27/2021 09/05/2020 02/08/2020 09/16/2019 09/16/2019  Falls in the past year? 0 0 0 0 0  Comment - - - - -  Number falls in past yr: 0 0 0 0 0  Comment - - - - -  Injury with Fall? 0 0 0 0 0  Risk for fall due to : - No Fall Risks - - -  Follow up Falls evaluation completed;Falls prevention discussed - Falls prevention discussed Falls evaluation completed Falls evaluation completed    FALL RISK PREVENTION PERTAINING TO THE HOME:  Any stairs in or around the home? No  If so, are there any without handrails? No  Home free of loose throw rugs in walkways, pet beds, electrical cords, etc? Yes  Adequate lighting in your home to reduce risk of falls? Yes   ASSISTIVE DEVICES UTILIZED TO PREVENT FALLS:  Life alert? No  Use of a cane, walker or w/c? No  Grab bars in  the bathroom? Yes  Shower chair or bench in shower? Yes  Elevated toilet seat or a handicapped toilet? Yes   TIMED UP AND GO:  Was the test performed? No .   Tele-health  Cognitive Function: MMSE - Mini Mental State Exam 07/03/2017  Not completed: (No Data)  Orientation to time 5  Orientation to Place 5  Registration 3  Attention/ Calculation 0  Recall 3  Language- name 2 objects 2  Language- repeat 1  Language- follow 3 step command 3  Language- read & follow direction 1  Write a sentence 1  Copy design 1  Total score 25     6CIT Screen 02/08/2020  What Year? 0 points  What month? 0 points  What time? 0 points  Count back from 20 0 points  Months in reverse 0 points  Repeat phrase 0 points  Total Score 0    Immunizations Immunization History  Administered Date(s) Administered   Influenza Split 06/27/2011, 07/02/2012   Influenza Whole 09/24/2007, 05/03/2008, 09/28/2009   Influenza, High Dose Seasonal PF 06/01/2013, 04/20/2020   Influenza,inj,Quad PF,6+ Mos 06/30/2014, 05/18/2015, 07/02/2016, 06/17/2017   Influenza-Unspecified 06/09/2018   PFIZER(Purple Top)SARS-COV-2 Vaccination 09/03/2019, 09/25/2019, 07/19/2020   Pneumococcal Conjugate-13 03/31/2015   Pneumococcal Polysaccharide-23 10/25/2008, 07/02/2016   Td 09/24/2007   Zoster, Live 05/14/2012    TDAP status: Up to date  Flu Vaccine status: Up to date  Pneumococcal vaccine status: Up to date  Covid-19 vaccine status: Completed vaccines  Qualifies for Shingles Vaccine? Yes   Zostavax completed Yes   Shingrix Completed?: No.    Education has been provided regarding the importance of this vaccine. Patient has been advised to call insurance company to determine out of pocket expense if they have not yet received this vaccine. Advised may also receive vaccine at local pharmacy or Health Dept. Verbalized acceptance and understanding.  Screening Tests Health Maintenance  Topic Date Due   Zoster Vaccines- Shingrix (1 of 2) Never done   TETANUS/TDAP  09/23/2017   COVID-19 Vaccine (4 - Booster for Pfizer series) 11/17/2020   INFLUENZA VACCINE  03/13/2021    Hepatitis C Screening  Completed   PNA vac Low Risk Adult  Completed   HPV VACCINES  Aged Out    Health Maintenance  Health Maintenance Due  Topic Date Due   Zoster Vaccines- Shingrix (1 of 2) Never done   TETANUS/TDAP  09/23/2017   COVID-19 Vaccine (4 - Booster for Pfizer series) 11/17/2020    Colorectal cancer screening: No longer required.   Lung Cancer Screening: (Low Dose CT Chest recommended if Age 39-80 years, 30 pack-year currently smoking OR have quit w/in 15years.) does not qualify.   Lung Cancer Screening Referral: na  Additional Screening:  Hepatitis C Screening: does not qualify; Completed   Vision Screening: Recommended annual ophthalmology exams for early detection of glaucoma and other disorders of the eye. Is the patient up to date with their annual eye exam?  Yes  Who is the provider or what is the name of the office in which the patient attends annual eye exams? Dr. Candise Bowens If pt is not established with a provider, would they like to be referred to a provider to establish care?  established .   Dental Screening: Recommended annual dental exams for proper oral hygiene  Community Resource Referral / Chronic Care Management: CRR required this visit?  No   CCM required this visit?  No      Plan:     I  have personally reviewed and noted the following in the patient's chart:   Medical and social history Use of alcohol, tobacco or illicit drugs  Current medications and supplements including opioid prescriptions. Patient is not currently taking opioid prescriptions. Functional ability and status Nutritional status Physical activity Advanced directives List of other physicians Hospitalizations, surgeries, and ER visits in previous 12 months Vitals Screenings to include cognitive, depression, and falls Referrals and appointments  In addition, I have reviewed and discussed with patient certain preventive protocols, quality metrics, and best practice  recommendations. A written personalized care plan for preventive services as well as general preventive health recommendations were provided to patient.     Leroy Kennedy, LPN   9/79/1504   Nurse Notes: na

## 2021-02-27 NOTE — Chronic Care Management (AMB) (Signed)
Chronic Care Management Pharmacy Assistant   Name: Stephen Morrison  MRN: [id number] DOB: 05-Sep-1945   Reason for Encounter: Disease State - General Adherence Call    Recent office visits:  None  Recent consult visits:  01/02/2021 [facility], Constance Haw, MD; no medication changes indicated.  Hospital visits:  None in previous 6 months  Medications: Outpatient Encounter Medications as of 02/27/2021  Medication Sig   acetaminophen (TYLENOL) 650 MG CR tablet Take 650 mg by mouth every 8 (eight) hours as needed for pain.   amLODipine (NORVASC) 10 MG tablet TAKE 1 TABLET BY MOUTH  DAILY   apixaban (ELIQUIS) 5 MG TABS tablet TAKE 1 TABLET BY MOUTH  TWICE DAILY   atorvastatin (LIPITOR) 40 MG tablet Take 1 tablet (40 mg total) by mouth daily.   carvedilol (COREG) 3.125 MG tablet TAKE 1 TABLET BY MOUTH  TWICE DAILY   cetirizine (ZYRTEC) 10 MG tablet Take 10 mg by mouth as needed for allergies.   colchicine 0.6 MG tablet Take 1 tablet (0.6 mg total) by mouth 2 (two) times daily as needed (pain). Reported on 10/03/2015   diclofenac Sodium (VOLTAREN) 1 % GEL Apply 4 g topically 4 (four) times daily.   ezetimibe (ZETIA) 10 MG tablet Take 1 tablet (10 mg total) by mouth daily.   ezetimibe-simvastatin (VYTORIN) 10-40 MG tablet TAKE 1 TABLET BY MOUTH  MONDAY, WEDNESDAY, AND  FRIDAY (Patient not taking: Reported on 02/27/2021)   flecainide (TAMBOCOR) 50 MG tablet TAKE 1 AND 1/2 TABLETS BY  MOUTH TWICE DAILY   furosemide (LASIX) 20 MG tablet TAKE 1 TABLET BY MOUTH  DAILY   gabapentin (NEURONTIN) 300 MG capsule TAKE 1 CAPSULE AT BEDTIME   glucosamine-chondroitin 500-400 MG tablet Take 1 tablet by mouth 3 (three) times daily.   hydrALAZINE (APRESOLINE) 10 MG tablet TAKE 1 TABLET BY MOUTH  TWICE DAILY   ibuprofen (ADVIL) 200 MG tablet Take 200 mg by mouth every 6 (six) hours as needed.   levothyroxine (SYNTHROID) 75 MCG tablet Take 1 tablet (75 mcg total) by mouth daily.   omeprazole  (PRILOSEC) 20 MG capsule Take 20 mg by mouth daily as needed (acid reflux).   Thiamine HCl (VITAMIN B-1) 250 MG tablet Take 250 mg by mouth daily.   TURMERIC CURCUMIN PO Take 1,000 mg by mouth daily.   valsartan-hydrochlorothiazide (DIOVAN-HCT) 320-12.5 MG tablet TAKE 1 TABLET BY MOUTH  DAILY   No facility-administered encounter medications on file as of 02/27/2021.   Patient Questions:  Have you had any problems recently with your health? Patient states he has not had any problems recently with his health.  Have you had any problems with your pharmacy? Patient states he has not had any problems recently with his pharmacy.  What issues or side effects are you having with your medications? Patient states he is not currently having any issues or side effects from any of his medications.  What would you like me to pass along to [name], CPP for him to help you with?  Patient states he does not have anything to pass along at this time.  What can we do to take care of you better? Patient did not have any suggestions. He states he is doing well and has been on all of his medications for greater than 5 years with no problems.  Future Appointments  Date Time Provider [place]  06/16/2021  2:00 PM [facility] PHARMACIST [facility]    Star Rating Drugs:  Atorvastatin 40 mg last filled 02/15/2021 90 DS Valsartan/HCTZ  320-12.5 mg last filled 12/23/2020 90 DS  April D Calhoun, [place] Pharmacist Assistant [phone]

## 2021-02-27 NOTE — Patient Instructions (Signed)
Stephen Morrison , Thank you for taking time to come for your Medicare Wellness Visit. I appreciate your ongoing commitment to your health goals. Please review the following plan we discussed and let me know if I can assist you in the future.   Screening recommendations/referrals: Colonoscopy: no longer required Recommended yearly ophthalmology/optometry visit for glaucoma screening and checkup Recommended yearly dental visit for hygiene and checkup  Vaccinations: Influenza vaccine: up to date Pneumococcal vaccine: up to date Tdap vaccine: Education provided Shingles vaccine: Education provided    Advanced directives: copy requested  Conditions/risks identified: NA    Preventive Care 16 Years and Older, Male Preventive care refers to lifestyle choices and visits with your health care provider that can promote health and wellness. What does preventive care include? A yearly physical exam. This is also called an annual well check. Dental exams once or twice a year. Routine eye exams. Ask your health care provider how often you should have your eyes checked. Personal lifestyle choices, including: Daily care of your teeth and gums. Regular physical activity. Eating a healthy diet. Avoiding tobacco and drug use. Limiting alcohol use. Practicing safe sex. Taking low doses of aspirin every day. Taking vitamin and mineral supplements as recommended by your health care provider. What happens during an annual well check? The services and screenings done by your health care provider during your annual well check will depend on your age, overall health, lifestyle risk factors, and family history of disease. Counseling  Your health care provider may ask you questions about your: Alcohol use. Tobacco use. Drug use. Emotional well-being. Home and relationship well-being. Sexual activity. Eating habits. History of falls. Memory and ability to understand (cognition). Work and work  Statistician. Screening  You may have the following tests or measurements: Height, weight, and BMI. Blood pressure. Lipid and cholesterol levels. These may be checked every 5 years, or more frequently if you are over 21 years old. Skin check. Lung cancer screening. You may have this screening every year starting at age 32 if you have a 30-pack-year history of smoking and currently smoke or have quit within the past 15 years. Fecal occult blood test (FOBT) of the stool. You may have this test every year starting at age 44. Flexible sigmoidoscopy or colonoscopy. You may have a sigmoidoscopy every 5 years or a colonoscopy every 10 years starting at age 59. Prostate cancer screening. Recommendations will vary depending on your family history and other risks. Hepatitis C blood test. Hepatitis B blood test. Sexually transmitted disease (STD) testing. Diabetes screening. This is done by checking your blood sugar (glucose) after you have not eaten for a while (fasting). You may have this done every 1-3 years. Abdominal aortic aneurysm (AAA) screening. You may need this if you are a current or former smoker. Osteoporosis. You may be screened starting at age 21 if you are at high risk. Talk with your health care provider about your test results, treatment options, and if necessary, the need for more tests. Vaccines  Your health care provider may recommend certain vaccines, such as: Influenza vaccine. This is recommended every year. Tetanus, diphtheria, and acellular pertussis (Tdap, Td) vaccine. You may need a Td booster every 10 years. Zoster vaccine. You may need this after age 63. Pneumococcal 13-valent conjugate (PCV13) vaccine. One dose is recommended after age 44. Pneumococcal polysaccharide (PPSV23) vaccine. One dose is recommended after age 18. Talk to your health care provider about which screenings and vaccines you need and how often you  need them. This information is not intended to replace  advice given to you by your health care provider. Make sure you discuss any questions you have with your health care provider. Document Released: 08/26/2015 Document Revised: 04/18/2016 Document Reviewed: 05/31/2015 Elsevier Interactive Patient Education  2017 [employer] Prevention in the Home Falls can cause injuries. They can happen to people of all ages. There are many things you can do to make your home safe and to help prevent falls. What can I do on the outside of my home? Regularly fix the edges of walkways and driveways and fix any cracks. Remove anything that might make you trip as you walk through a door, such as a raised step or threshold. Trim any bushes or trees on the path to your home. Use bright outdoor lighting. Clear any walking paths of anything that might make someone trip, such as rocks or tools. Regularly check to see if handrails are loose or broken. Make sure that both sides of any steps have handrails. Any raised decks and porches should have guardrails on the edges. Have any leaves, snow, or ice cleared regularly. Use sand or salt on walking paths during winter. Clean up any spills in your garage right away. This includes oil or grease spills. What can I do in the bathroom? Use night lights. Install grab bars by the toilet and in the tub and shower. Do not use towel bars as grab bars. Use non-skid mats or decals in the tub or shower. If you need to sit down in the shower, use a plastic, non-slip stool. Keep the floor dry. Clean up any water that spills on the floor as soon as it happens. Remove soap buildup in the tub or shower regularly. Attach bath mats securely with double-sided non-slip rug tape. Do not have throw rugs and other things on the floor that can make you trip. What can I do in the bedroom? Use night lights. Make sure that you have a light by your bed that is easy to reach. Do not use any sheets or blankets that are too big for your bed.  They should not hang down onto the floor. Have a firm chair that has side arms. You can use this for support while you get dressed. Do not have throw rugs and other things on the floor that can make you trip. What can I do in the kitchen? Clean up any spills right away. Avoid walking on wet floors. Keep items that you use a lot in easy-to-reach places. If you need to reach something above you, use a strong step stool that has a grab bar. Keep electrical cords out of the way. Do not use floor polish or wax that makes floors slippery. If you must use wax, use non-skid floor wax. Do not have throw rugs and other things on the floor that can make you trip. What can I do with my stairs? Do not leave any items on the stairs. Make sure that there are handrails on both sides of the stairs and use them. Fix handrails that are broken or loose. Make sure that handrails are as long as the stairways. Check any carpeting to make sure that it is firmly attached to the stairs. Fix any carpet that is loose or worn. Avoid having throw rugs at the top or bottom of the stairs. If you do have throw rugs, attach them to the floor with carpet tape. Make sure that you have a light switch  at the top of the stairs and the bottom of the stairs. If you do not have them, ask someone to add them for you. What else can I do to help prevent falls? Wear shoes that: Do not have high heels. Have rubber bottoms. Are comfortable and fit you well. Are closed at the toe. Do not wear sandals. If you use a stepladder: Make sure that it is fully opened. Do not climb a closed stepladder. Make sure that both sides of the stepladder are locked into place. Ask someone to hold it for you, if possible. Clearly mark and make sure that you can see: Any grab bars or handrails. First and last steps. Where the edge of each step is. Use tools that help you move around (mobility aids) if they are needed. These  include: Canes. Walkers. Scooters. Crutches. Turn on the lights when you go into a dark area. Replace any light bulbs as soon as they burn out. Set up your furniture so you have a clear path. Avoid moving your furniture around. If any of your floors are uneven, fix them. If there are any pets around you, be aware of where they are. Review your medicines with your doctor. Some medicines can make you feel dizzy. This can increase your chance of falling. Ask your doctor what other things that you can do to help prevent falls. This information is not intended to replace advice given to you by your health care provider. Make sure you discuss any questions you have with your health care provider. Document Released: 05/26/2009 Document Revised: 01/05/2016 Document Reviewed: 09/03/2014 Elsevier Interactive Patient Education  2017 [employer].

## 2021-03-17 ENCOUNTER — Other Ambulatory Visit: Payer: Self-pay | Admitting: Cardiology

## 2021-03-26 ENCOUNTER — Other Ambulatory Visit: Payer: Self-pay | Admitting: Family Medicine

## 2021-04-10 ENCOUNTER — Other Ambulatory Visit: Payer: Self-pay | Admitting: Family Medicine

## 2021-04-10 DIAGNOSIS — E785 Hyperlipidemia, unspecified: Secondary | ICD-10-CM

## 2021-04-19 ENCOUNTER — Telehealth: Payer: Self-pay

## 2021-04-19 NOTE — Progress Notes (Signed)
Chronic Care Management Pharmacy Assistant   Name: Stephen Morrison  MRN: [id number] DOB: 06-Aug-1946   Reason for Encounter: Disease State - General Adherence Call     Recent office visits:  None noted.   Recent consult visits:  None noted.  Hospital visits:  None in previous 6 months  Medications: Outpatient Encounter Medications as of 04/19/2021  Medication Sig   acetaminophen (TYLENOL) 650 MG CR tablet Take 650 mg by mouth every 8 (eight) hours as needed for pain.   amLODipine (NORVASC) 10 MG tablet TAKE 1 TABLET BY MOUTH  DAILY   apixaban (ELIQUIS) 5 MG TABS tablet TAKE 1 TABLET BY MOUTH  TWICE DAILY   atorvastatin (LIPITOR) 40 MG tablet TAKE 1 TABLET BY MOUTH  DAILY   carvedilol (COREG) 3.125 MG tablet TAKE 1 TABLET BY MOUTH  TWICE DAILY   cetirizine (ZYRTEC) 10 MG tablet Take 10 mg by mouth as needed for allergies.   colchicine 0.6 MG tablet Take 1 tablet (0.6 mg total) by mouth 2 (two) times daily as needed (pain). Reported on 10/03/2015   diclofenac Sodium (VOLTAREN) 1 % GEL Apply 4 g topically 4 (four) times daily.   ezetimibe (ZETIA) 10 MG tablet TAKE 1 TABLET BY MOUTH  DAILY   ezetimibe-simvastatin (VYTORIN) 10-40 MG tablet TAKE 1 TABLET BY MOUTH  MONDAY, WEDNESDAY, AND  FRIDAY (Patient not taking: Reported on 02/27/2021)   flecainide (TAMBOCOR) 50 MG tablet TAKE 1 AND 1/2 TABLETS BY  MOUTH TWICE DAILY   furosemide (LASIX) 20 MG tablet TAKE 1 TABLET BY MOUTH  DAILY   gabapentin (NEURONTIN) 300 MG capsule TAKE 1 CAPSULE AT BEDTIME   glucosamine-chondroitin 500-400 MG tablet Take 1 tablet by mouth 3 (three) times daily.   hydrALAZINE (APRESOLINE) 10 MG tablet TAKE 1 TABLET BY MOUTH  TWICE DAILY   ibuprofen (ADVIL) 200 MG tablet Take 200 mg by mouth every 6 (six) hours as needed.   levothyroxine (SYNTHROID) 75 MCG tablet Take 1 tablet (75 mcg total) by mouth daily.   omeprazole (PRILOSEC) 20 MG capsule Take 20 mg by mouth daily as needed (acid reflux).   Thiamine HCl  (VITAMIN B-1) 250 MG tablet Take 250 mg by mouth daily.   TURMERIC CURCUMIN PO Take 1,000 mg by mouth daily.   valsartan-hydrochlorothiazide (DIOVAN-HCT) 320-12.5 MG tablet TAKE 1 TABLET BY MOUTH  DAILY   No facility-administered encounter medications on file as of 04/19/2021.    Have you had any problems recently with your health? Patient denied any problems with his health.   Have you had any problems with your pharmacy? Patient denied any issues or problems with his pharmacy.  What issues or side effects are you having with your medications? Patient denied any issues or side effects with his current medications.   What would you like me to pass along to [name], CPP for them to help you with?  Patient did not have anything to pass along to CPP at this time.  What can we do to take care of you better? Patient did not have any recommendations. He is currently satisfied with his current level of care.   Care Gaps  Colonoscopy: done 06/13/17 DEXA:never Influenza: due 03/13/21 Mammogram: N/A Diabetes Eye Exam: N/A Annual Well Visit: done 02/27/21 PAP: N/A Tetanus: overdue Zoster: never HIV screening:never PSA: done 09/05/20   Star Rating Drugs: Valsartan-hydrochlorothiazide (DIOVAN-HCT) 320-12.5 MG tablet - last filled 03/20/21 90 days  Ezetimibe-simvastatin (VYTORIN) 10-40 MG tablet - last filled 10/28/20 90  days  ??? Atorvastatin (LIPITOR) 40 MG tablet - last filled 04/19/21 90 days   Future Appointments  Date Time Provider [place]  06/16/2021  2:00 PM [facility] PHARMACIST [facility] [place], [place] Pharmacist Assistant  [phone]  Time Spent: 30 minutes

## 2021-06-02 ENCOUNTER — Encounter: Payer: Self-pay | Admitting: Registered Nurse

## 2021-06-02 ENCOUNTER — Telehealth (INDEPENDENT_AMBULATORY_CARE_PROVIDER_SITE_OTHER): Payer: Medicare Other | Admitting: Registered Nurse

## 2021-06-02 ENCOUNTER — Other Ambulatory Visit: Payer: Self-pay

## 2021-06-02 DIAGNOSIS — U071 COVID-19: Secondary | ICD-10-CM | POA: Diagnosis not present

## 2021-06-02 MED ORDER — MOLNUPIRAVIR EUA 200MG CAPSULE: 4.0000 | ORAL_CAPSULE | Freq: Two times a day (BID) | ORAL | 0 refills | Status: AC

## 2021-06-02 MED ORDER — AZELASTINE HCL 0.1 % NA SOLN: 1.0000 | Freq: Two times a day (BID) | NASAL | 12 refills | Status: DC

## 2021-06-02 MED ORDER — DM-GUAIFENESIN ER 30-600 MG PO TB12: 1.0000 | ORAL_TABLET | Freq: Two times a day (BID) | ORAL | 0 refills | Status: DC

## 2021-06-02 NOTE — Progress Notes (Signed)
Telemedicine Encounter- SOAP NOTE Established Patient  This telephone encounter was conducted with the patient's (or proxy's) verbal consent via audio telecommunications: yes/no: Yes Patient was instructed to have this encounter in a suitably private space; and to only have persons present to whom they give permission to participate. In addition, patient identity was confirmed by use of name plus two identifiers (DOB and address).  I discussed the limitations, risks, security and privacy concerns of performing an evaluation and management service by telephone and the availability of in person appointments. I also discussed with the patient that there may be a patient responsible charge related to this service. The patient expressed understanding and agreed to proceed.  I spent a total of 16 minutes talking with the patient or their proxy.  Patient at home Provider in office  Participants: Kathrin Ruddy, NP and [name]  Chief Complaint  Patient presents with   Covid Positive    Patient states he tested positive for covid yesterday. Patient states felt bad on Wednesday and only feel like he has a severe cold.He would like some medicine to help with the symptoms.    Subjective   ODIES DESA is a 75 y.o. established patient. Telephone visit today for COVID-19  HPI Symptoms started Wednesday. Positive test on Thursday  Symptoms of cold and URI on Wednesday. Nasal congestion with pnd, cough, sinus pressure  No fevers, chills, fatigue, sweats.   Has been using OTC antihistamine without effect   Patient Active Problem List   Diagnosis Date Noted   Urinary retention    GERD (gastroesophageal reflux disease)    Diverticulosis of colon    BPH (benign prostatic hyperplasia)    Atrial fibrillation (HCC)    Arthritis    Allergy    Colon cancer screening 05/29/2017   Chronic anticoagulation 05/29/2017   Lower leg edema 12/28/2016   Tick bite 02/25/2016   Erectile dysfunction  10/04/2015   Physical exam 03/31/2015   Benign prostatic hyperplasia with urinary retention 05/17/2014   Urinary retention due to benign prostatic hyperplasia 05/12/2014   Osteoarthritis 04/09/2014   LIPOMA 03/06/2010   FATTY LIVER DISEASE 03/06/2010   Hypothyroidism 10/25/2008   Hyperlipidemia 02/03/2007   GOUT 02/03/2007   Essential hypertension 02/03/2007    Past Medical History:  Diagnosis Date   Allergy    Arthritis    Atrial fibrillation ([place])    one time incident, on medication now with no further problems   BPH (benign prostatic hyperplasia)    Diverticulosis of colon    GERD (gastroesophageal reflux disease)    Gout    STABLE PER PT ON 05-12-2014   Hyperlipidemia    Hypertension    Hypothyroidism    PSA elevation    Tick bite 02/25/2016   Urinary retention     Current Outpatient Medications  Medication Sig Dispense Refill   acetaminophen (TYLENOL) 650 MG CR tablet Take 650 mg by mouth every 8 (eight) hours as needed for pain.     amLODipine (NORVASC) 10 MG tablet TAKE 1 TABLET BY MOUTH  DAILY 90 tablet 3   apixaban (ELIQUIS) 5 MG TABS tablet TAKE 1 TABLET BY MOUTH  TWICE DAILY 180 tablet 1   atorvastatin (LIPITOR) 40 MG tablet TAKE 1 TABLET BY MOUTH  DAILY 90 tablet 3   azelastine (ASTELIN) 0.1 % nasal spray Place 1 spray into both nostrils 2 (two) times daily. Use in each nostril as directed 30 mL 12   carvedilol (COREG) 3.125  MG tablet TAKE 1 TABLET BY MOUTH  TWICE DAILY 180 tablet 3   cetirizine (ZYRTEC) 10 MG tablet Take 10 mg by mouth as needed for allergies.     colchicine 0.6 MG tablet Take 1 tablet (0.6 mg total) by mouth 2 (two) times daily as needed (pain). Reported on 10/03/2015 30 tablet 3   dextromethorphan-guaiFENesin (MUCINEX DM) 30-600 MG 12hr tablet Take 1 tablet by mouth 2 (two) times daily. 20 tablet 0   diclofenac Sodium (VOLTAREN) 1 % GEL Apply 4 g topically 4 (four) times daily.     ezetimibe (ZETIA) 10 MG tablet TAKE 1 TABLET BY MOUTH  DAILY  90 tablet 3   flecainide (TAMBOCOR) 50 MG tablet TAKE 1 AND 1/2 TABLETS BY  MOUTH TWICE DAILY 270 tablet 3   furosemide (LASIX) 20 MG tablet TAKE 1 TABLET BY MOUTH  DAILY 90 tablet 3   gabapentin (NEURONTIN) 300 MG capsule TAKE 1 CAPSULE AT BEDTIME 90 capsule 1   glucosamine-chondroitin 500-400 MG tablet Take 1 tablet by mouth 3 (three) times daily.     hydrALAZINE (APRESOLINE) 10 MG tablet TAKE 1 TABLET BY MOUTH  TWICE DAILY 180 tablet 3   ibuprofen (ADVIL) 200 MG tablet Take 200 mg by mouth every 6 (six) hours as needed.     levothyroxine (SYNTHROID) 75 MCG tablet Take 1 tablet (75 mcg total) by mouth daily. 90 tablet 1   molnupiravir EUA (LAGEVRIO) 200 mg CAPS capsule Take 4 capsules (800 mg total) by mouth 2 (two) times daily for 5 days. 40 capsule 0   omeprazole (PRILOSEC) 20 MG capsule Take 20 mg by mouth daily as needed (acid reflux).     Thiamine HCl (VITAMIN B-1) 250 MG tablet Take 250 mg by mouth daily.     TURMERIC CURCUMIN PO Take 1,000 mg by mouth daily.     valsartan-hydrochlorothiazide (DIOVAN-HCT) 320-12.5 MG tablet TAKE 1 TABLET BY MOUTH  DAILY 90 tablet 3   ezetimibe-simvastatin (VYTORIN) 10-40 MG tablet TAKE 1 TABLET BY MOUTH  MONDAY, WEDNESDAY, AND  FRIDAY (Patient not taking: No sig reported) 39 tablet 3   No current facility-administered medications for this visit.      Social History   Socioeconomic History   Marital status: Married    Spouse name: Not on file   Number of children: Not on file   Years of education: Not on file   Highest education level: Not on file  Occupational History   Occupation: retired  Tobacco Use   Smoking status: Former    Packs/day: 1.00    Years: 10.00    Pack years: 10.00    Types: Cigarettes    Quit date: 08/13/1973    Years since quitting: 47.8   Smokeless tobacco: Former    Types: Snuff, Chew    Quit date: 05/12/1974  Vaping Use   Vaping Use: Never used  Substance and Sexual Activity   Alcohol use: Yes    Alcohol/week:  0.0 standard drinks    Comment: OCCASIONALLY   Drug use: No   Sexual activity: Not on file  Other Topics Concern   Not on file  Social History Narrative   Not on file   Social Determinants of Health   Financial Resource Strain: Low Risk    Difficulty of Paying Living Expenses: Not hard at all  Food Insecurity: No Food Insecurity   Worried About [place] in the Last Year: Never true   [place] in the Last  Year: Never true  Transportation Needs: No Transportation Needs   Lack of Transportation (Medical): No   Lack of Transportation (Non-Medical): No  Physical Activity: Insufficiently Active   Days of Exercise per Week: 2 days   Minutes of Exercise per Session: 60 min  Stress: No Stress Concern Present   Feeling of Stress : Not at all  Social Connections: Moderately Integrated   Frequency of Communication with Friends and Family: More than three times a week   Frequency of Social Gatherings with Friends and Family: More than three times a week   Attends Religious Services: Never   Marine scientist or Organizations: Yes   Attends Music therapist: More than 4 times per year   Marital Status: Married  Human resources officer Violence: Not on file    ROS Per hpi   Objective   Vitals as reported by the patient: There were no vitals filed for this visit.  [place] was seen today for covid positive.  Diagnoses and all orders for this visit:  COVID-19 -     dextromethorphan-guaiFENesin (MUCINEX DM) 30-600 MG 12hr tablet; Take 1 tablet by mouth 2 (two) times daily. -     azelastine (ASTELIN) 0.1 % nasal spray; Place 1 spray into both nostrils 2 (two) times daily. Use in each nostril as directed -     molnupiravir EUA (LAGEVRIO) 200 mg CAPS capsule; Take 4 capsules (800 mg total) by mouth 2 (two) times daily for 5 days.   PLAN Given eliquis and simvastatin use, will use molnupiravir as antiviral Supportive care with mucinex dm and azelastine nasal  spray Reviewed ER precautions with patient who voiced understanding Patient encouraged to call clinic with any questions, comments, or concerns.   I discussed the assessment and treatment plan with the patient. The patient was provided an opportunity to ask questions and all were answered. The patient agreed with the plan and demonstrated an understanding of the instructions.   The patient was advised to call back or seek an in-person evaluation if the symptoms worsen or if the condition fails to improve as anticipated.  I provided 16 minutes of non-face-to-face time during this encounter.  Maximiano Coss, NP

## 2021-06-16 ENCOUNTER — Telehealth: Payer: Medicare Other

## 2021-06-21 ENCOUNTER — Ambulatory Visit (INDEPENDENT_AMBULATORY_CARE_PROVIDER_SITE_OTHER): Payer: Medicare Other

## 2021-06-21 VITALS — BP 129/66

## 2021-06-21 DIAGNOSIS — I10 Essential (primary) hypertension: Secondary | ICD-10-CM

## 2021-06-21 DIAGNOSIS — I4891 Unspecified atrial fibrillation: Secondary | ICD-10-CM

## 2021-06-21 DIAGNOSIS — E785 Hyperlipidemia, unspecified: Secondary | ICD-10-CM

## 2021-06-21 NOTE — Patient Instructions (Signed)
[name],  Thank you for talking with me today. I have included our care plan/goals in the following pages.   Please review and call me at [phone] with any questions.  Thanks! Ellin Mayhew, PharmD, CPP Clinical Pharmacist Practitioner  [phone]  Care Plan : [place]  Updates made by [name], [facility] since 06/21/2021 12:00 AM     Problem: Afib, HTN, hypothyroidism, HLD, GERD, BPH, lower leg edema, ED, OA, fatty liver, gout   Priority: High     Long-Range Goal: Disease Management   Start Date: 12/01/2020  Expected End Date: 12/01/2021  Recent Progress: On track  Priority: High  Note:   Pharmacist Clinical Goal(s):  Over the next 365 days, patient will contact provider office for questions/concerns as evidenced notation of same in electronic health record through collaboration with PharmD and provider.   Interventions: 1:1 collaboration with [name], MD regarding development and update of comprehensive plan of care as evidenced by provider attestation and co-signature Inter-disciplinary care team collaboration (see longitudinal plan of care) Comprehensive medication review performed; medication list updated in electronic medical record No Rx changes   Hypertension (BP goal <140/90) -Not ideally controlled in office, but controlled based on home readings <130/80 -Current treatment (Dr Birdie Riddle): Carvedilol 3.125 mg twice daily  Amlodipine 10 mg once daily  Furosemide 20 mg daily  Hydralazine 10 mg once daily Valsartan-HCTZ 320-12.5 mg once daily   -Previous swelling while taking amlodipine 10 mg (resolved).  -Denies hypotensive/hypertensive symptoms. No problems noted.  -Reports most recent home reading 129/66 06/19/2021.  -Current dietary habits: lots of vegetables, pork, avoids processed foods. Current exercise habits: golfing about 2x/week, yard work.  -Reviewed exercise goal of 150 minutes/wk -Counseled to monitor BP at home  1-2x/wk, document, and provide log at future appointments -Recommended to continue current medication  Hyperlipidemia: (LDL goal < 70) -Controlled -Current treatment: Atorvastatin 40 mg once daily  Zetia 10 mg daily  -Previously on ezetimibe-simvastatin 10-40 mg TIW (insurance issues due to being on atorvastatin already 10/2020) -Side effect review - no problems noted with current regimen  -Educated on Cholesterol goals and reviewed appropriate medication use.  -Recommended to continue current medication  Atrial Fibrillation (Goal: prevent stroke and major bleeding) -Controlled -Current treatment: Rate control: on carvedilol. Rhythm: flecainide 75 mg twice daily (Dr Curt Bears) Anticoagulation: Eliquis 5 mg twice daily (Dr Curt Bears) -Medications previously tried: Xarelto (increased bleeding) -Reviewed financial concerns with eliquis - would typically spend $500 for 30 DS last two months of year. Does not qualify for patient assistance due to income. Referral to medicare counselor offered - declined today.  -Denies any side effects -Recommended to continue current medication  Patient Goals/Self-Care Activities Over the next 365 days, patient will:  - take medications as prescribed target a minimum of 150 minutes of moderate intensity exercise weekly  Medication Assistance: None required.  Patient affirms current coverage meets needs.    The patient verbalized understanding of instructions provided today and agreed to receive a MyChart copy of patient instruction and/or educational materials. Telephone follow up appointment with pharmacy team member scheduled for: See next appointment with "Care Management Staff" under "What's Next" below.

## 2021-06-21 NOTE — Progress Notes (Signed)
Chronic Care Management Pharmacy Note  06/21/2021 Name:  Stephen Morrison MRN:  [id number] DOB:  03-01-1946  Subjective: Stephen Morrison is an 75 y.o. year old male who is a primary patient of [name], [name], MD.  The CCM team was consulted for assistance with disease management and care coordination needs.    Engaged with patient by telephone for follow up visit in response to provider referral for pharmacy case management and/or care coordination services.   Consent to Services:  The patient was given information about Chronic Care Management services, agreed to services, and gave verbal consent prior to initiation of services.  Please see initial visit note for detailed documentation.   Patient Care Team: [name], MD as PCP - General (Family Medicine) Constance Haw, MD as PCP - Cardiology (Cardiology) [name], MD (Inactive) as Consulting Physician (Urology) [facility] [name], [facility] as Pharmacist (Pharmacist)  Hospital visits: None in previous 6 months  Objective: Lab Results  Component Value Date   CREATININE 1.05 09/05/2020   CREATININE 1.11 09/18/2019   CREATININE 0.97 03/13/2019   GFR 69.06 09/05/2020   GFR 64.53 09/18/2019   GFRNONAA 77 03/13/2019   GFRNONAA >60 11/05/2015  Last diabetic Eye exam: No results found for: HMDIABEYEEXA  Last diabetic Foot exam: No results found for: HMDIABFOOTEX  Lab Results  Component Value Date   CHOL 130 09/05/2020   CHOL 109 09/18/2019   TRIG 150.0 (H) 09/05/2020   TRIG 184.0 (H) 09/18/2019   HDL 37.70 (L) 09/05/2020   HDL 29.90 (L) 09/18/2019   CHOLHDL 3 09/05/2020   CHOLHDL 4 09/18/2019   VLDL 30.0 09/05/2020   VLDL 36.8 09/18/2019   LDLCALC 62 09/05/2020   LDLCALC 42 09/18/2019   LDLDIRECT 123.3 06/01/2013   LDLDIRECT 114.2 12/29/2012   Hepatic Function Latest Ref Rng & Units 09/05/2020 09/18/2019 12/09/2017  Total Protein 6.0 - 8.3 g/dL 7.7 7.5 7.0  Albumin 3.5 - 5.2  g/dL 4.8 4.3 4.3  AST 0 - 37 U/L '24 22 22  ' ALT 0 - 53 U/L '31 28 29  ' Alk Phosphatase 39 - 117 U/L 67 66 57  Total Bilirubin 0.2 - 1.2 mg/dL 1.0 0.9 0.9  Bilirubin, Direct 0.0 - 0.3 mg/dL 0.2 0.2 0.2   Lab Results  Component Value Date/Time   TSH 2.98 10/03/2020 08:51 AM   TSH 6.13 (H) 09/05/2020 08:16 AM   FREET4 0.78 06/01/2013 12:37 PM   CBC Latest Ref Rng & Units 09/05/2020 09/18/2019 03/13/2019  WBC 4.0 - 10.5 K/uL 9.1 8.3 7.9  Hemoglobin 13.0 - 17.0 g/dL 17.3(H) 16.4 15.9  Hematocrit 39.0 - 52.0 % 49.8 47.7 45.9  Platelets 150.0 - 400.0 K/uL 366.0 354.0 356   No results found for: VD25OH  Clinical ASCVD:  The 10-year ASCVD risk score (Arnett DK, et al., 2019) is: 31.2%   Values used to calculate the score:     Age: 40 years     Sex: Male     Is Non-Hispanic African American: No     Diabetic: No     Tobacco smoker: No     Systolic Blood Pressure: 553 mmHg     Is BP treated: Yes     HDL Cholesterol: 37.7 mg/dL     Total Cholesterol: 130 mg/dL    Social History   Tobacco Use  Smoking Status Former   Packs/day: 1.00   Years: 10.00   Pack years: 10.00   Types: Cigarettes   Quit  date: 08/13/1973   Years since quitting: 47.8  Smokeless Tobacco Former   Types: Snuff, Chew   Quit date: 05/12/1974   BP Readings from Last 3 Encounters:  06/19/21 129/66  01/02/21 (!) 144/66  09/05/20 138/80   Pulse Readings from Last 3 Encounters:  01/02/21 (!) 59  09/05/20 (!) 56  09/28/19 (!) 57   Wt Readings from Last 3 Encounters:  01/02/21 216 lb (98 kg)  09/05/20 222 lb (100.7 kg)  02/08/20 223 lb (101.2 kg)    Assessment: Review of patient past medical history, allergies, medications, health status, including review of consultants reports, laboratory and other test data, was performed as part of comprehensive evaluation and provision of chronic care management services.   SDOH:  (Social Determinants of Health) assessments and interventions performed: Yes.  CCM Care  Plan Allergies  Allergen Reactions   Amoxicillin Swelling and Rash    Tongue swells Has patient had a PCN reaction causing immediate rash, facial/tongue/throat swelling, SOB or lightheadedness with hypotension: {YES Has patient had a PCN reaction causing severe rash involving mucus membranes or skin necrosis: {NO Has patient had a PCN reaction that required hospitalization {YES Has patient had a PCN reaction occurring within the last 10 years: {NO If all of the above answers are "NO", then may proceed with Cephalosporin use.    Medications Reviewed Today     Reviewed by [name], [place] (Pharmacist) on 06/21/21 at [id number]  Med List Status: <None>   Medication Order Taking? Sig Documenting Provider Last Dose Status Informant  acetaminophen (TYLENOL) 650 MG CR tablet [id number]  Take 650 mg by mouth every 8 (eight) hours as needed for pain. [provider]  Active   amLODipine (NORVASC) 10 MG tablet [id number] Yes TAKE 1 TABLET BY MOUTH  DAILY [name], MD  Active   apixaban (ELIQUIS) 5 MG TABS tablet [id number] Yes TAKE 1 TABLET BY MOUTH  TWICE DAILY Camnitz, Will Hassell Done, MD  Active   atorvastatin (LIPITOR) 40 MG tablet [id number] Yes TAKE 1 TABLET BY MOUTH  DAILY [name], MD  Active   azelastine (ASTELIN) 0.1 % nasal spray [id number]  Place 1 spray into both nostrils 2 (two) times daily. Use in each nostril as directed Maximiano Coss, NP  Active   carvedilol (COREG) 3.125 MG tablet [id number] Yes TAKE 1 TABLET BY MOUTH  TWICE DAILY [name], MD  Active   cetirizine (ZYRTEC) 10 MG tablet [id number] Yes Take 10 mg by mouth as needed for allergies. [provider]  Active Self  colchicine 0.6 MG tablet [id number]  Take 1 tablet (0.6 mg total) by mouth 2 (two) times daily as needed (pain). Reported on 10/03/2015 [name], MD  Active   dextromethorphan-guaiFENesin [facility] DM) 30-600 MG 12hr tablet [id number]  Take 1 tablet by mouth 2 (two) times  daily. Maximiano Coss, NP  Active   diclofenac Sodium (VOLTAREN) 1 % GEL [id number]  Apply 4 g topically 4 (four) times daily. [provider]  Active Self  ezetimibe (ZETIA) 10 MG tablet [id number] Yes TAKE 1 TABLET BY MOUTH  DAILY [name], MD  Active    Discontinued 06/21/21 0824 (Change in therapy)   flecainide (TAMBOCOR) 50 MG tablet [id number] Yes TAKE 1 AND 1/2 TABLETS BY  MOUTH TWICE DAILY Camnitz, Will Hassell Done, MD  Active   furosemide (LASIX) 20 MG tablet [id number] Yes TAKE 1 TABLET BY MOUTH  DAILY [name], MD  Active   gabapentin (NEURONTIN) 300  MG capsule [id number] Yes TAKE 1 CAPSULE AT BEDTIME [name], MD  Active    Discontinued 06/21/21 [phone] (Patient Discharge)   hydrALAZINE (APRESOLINE) 10 MG tablet [id number] Yes TAKE 1 TABLET BY MOUTH  TWICE DAILY [name], MD  Active   ibuprofen (ADVIL) 200 MG tablet [id number]  Take 200 mg by mouth every 6 (six) hours as needed. [provider]  Active   levothyroxine (SYNTHROID) 75 MCG tablet [id number] Yes Take 1 tablet (75 mcg total) by mouth daily. [name], MD  Active   omeprazole (PRILOSEC) 20 MG capsule [id number] Yes Take 20 mg by mouth daily as needed (acid reflux). [provider]  Active Self  Thiamine HCl (VITAMIN B-1) 250 MG tablet [id number]  Take 250 mg by mouth daily. [provider]  Active   TURMERIC CURCUMIN PO [id number] Yes Take 1,000 mg by mouth daily. [provider]  Active   valsartan-hydrochlorothiazide (DIOVAN-HCT) 320-12.5 MG tablet [id number] Yes TAKE 1 TABLET BY MOUTH  DAILY [name], MD  Active            Patient Active Problem List   Diagnosis Date Noted   Urinary retention    GERD (gastroesophageal reflux disease)    Diverticulosis of colon    BPH (benign prostatic hyperplasia)    Atrial fibrillation ([place])    Arthritis    Allergy    Colon cancer screening 05/29/2017   Chronic anticoagulation  05/29/2017   Lower leg edema 12/28/2016   Tick bite 02/25/2016   Erectile dysfunction 10/04/2015   Physical exam 03/31/2015   Benign prostatic hyperplasia with urinary retention 05/17/2014   Urinary retention due to benign prostatic hyperplasia 05/12/2014   Osteoarthritis 04/09/2014   LIPOMA 03/06/2010   FATTY LIVER DISEASE 03/06/2010   Hypothyroidism 10/25/2008   Hyperlipidemia 02/03/2007   GOUT 02/03/2007   Essential hypertension 02/03/2007   Immunization History  Administered Date(s) Administered   Influenza Split 06/27/2011, 07/02/2012   Influenza Whole 09/24/2007, 05/03/2008, 09/28/2009   Influenza, High Dose Seasonal PF 06/01/2013, 04/20/2020, 05/29/2021   Influenza,inj,Quad PF,6+ Mos 06/30/2014, 05/18/2015, 07/02/2016, 06/17/2017   Influenza-Unspecified 06/09/2018, 05/30/2021   PFIZER(Purple Top)SARS-COV-2 Vaccination 09/03/2019, 09/25/2019, 07/19/2020   Pfizer Covid-19 Vaccine Bivalent Booster 64yr & up 05/31/2021   Pneumococcal Conjugate-13 03/31/2015   Pneumococcal Polysaccharide-23 10/25/2008, 07/02/2016   Td 09/24/2007   Zoster, Live 05/14/2012    Conditions to be addressed/monitored: Afib, HTN, hypothyroidism, HLD, GERD, BPH, lower leg edema, ED, OA, fatty liver, gout   [place] Updates [facility]adelin [facility] 06/21/2021 12:00 AM     Problem: Afib, HTN, hypothyroidism, HLD, GERD, BPH, lower leg edema, ED, OA, fatty liver, gout   Priority: High     Long-Range Goal: Disease Management   Start Date: 12/01/2020  Expected End Date: 12/01/2021  Recent Progress: On track  Priority: High  Note:   Pharmacist Clinical Goal(s):  Over the next 365 days, patient will contact provider office for questions/concerns as evidenced notation of same in electronic health record through collaboration with PharmD and provider.   Interventions: 1:1 collaboration with TMidge Minium MD regarding development and update of comprehensive plan  of care as evidenced by provider attestation and co-signature Inter-disciplinary care team collaboration (see longitudinal plan of care) Comprehensive medication review performed; medication list updated in electronic medical record No Rx changes   Hypertension (BP goal <140/90) -Not ideally controlled in office, but controlled based on home readings <130/80 -  Current treatment (Dr Birdie Riddle): Carvedilol 3.125 mg twice daily  Amlodipine 10 mg once daily  Furosemide 20 mg daily  Hydralazine 10 mg once daily Valsartan-HCTZ 320-12.5 mg once daily   -Previous swelling while taking amlodipine 10 mg (resolved).  -Denies hypotensive/hypertensive symptoms. No problems noted.  -Reports most recent home reading 129/66 06/19/2021.  -Current dietary habits: lots of vegetables, pork, avoids processed foods. Current exercise habits: golfing about 2x/week, yard work.  -Reviewed exercise goal of 150 minutes/wk -Counseled to monitor BP at home 1-2x/wk, document, and provide log at future appointments -Recommended to continue current medication  Hyperlipidemia: (LDL goal < 70) -Controlled -Current treatment: Atorvastatin 40 mg once daily  Zetia 10 mg daily  -Previously on ezetimibe-simvastatin 10-40 mg TIW (insurance issues due to being on atorvastatin already 10/2020) -Side effect review - no problems noted with current regimen  -Educated on Cholesterol goals and reviewed appropriate medication use.  -Recommended to continue current medication  Atrial Fibrillation (Goal: prevent stroke and major bleeding) -Controlled -Current treatment: Rate control: on carvedilol. Rhythm: flecainide 75 mg twice daily (Dr Curt Bears) Anticoagulation: Eliquis 5 mg twice daily (Dr Curt Bears) -Medications previously tried: Xarelto (increased bleeding) -Reviewed financial concerns with eliquis - would typically spend $500 for 30 DS last two months of year. Does not qualify for patient assistance due to income. Referral to  medicare counselor offered - declined today.  -Denies any side effects -Recommended to continue current medication  Patient Goals/Self-Care Activities Over the next 365 days, patient will:  - take medications as prescribed target a minimum of 150 minutes of moderate intensity exercise weekly  Medication Assistance: None required.  Patient affirms current coverage meets needs.   F/u" 3 month HC BP call, 6 month CPP telephone   Future Appointments  Date Time Provider [place]  12/20/2021  8:30 AM [facility] CCM PHARMACIST [facility]   [name], Pharm.D., CPP Clinical Pharmacist Practitioner  [facility] [phone]

## 2021-07-04 ENCOUNTER — Encounter: Payer: Self-pay | Admitting: Cardiology

## 2021-07-04 ENCOUNTER — Other Ambulatory Visit: Payer: Self-pay

## 2021-07-04 MED ORDER — APIXABAN 5 MG PO TABS: 5.0000 mg | ORAL_TABLET | Freq: Two times a day (BID) | ORAL | 5 refills | Status: DC

## 2021-07-04 NOTE — Telephone Encounter (Signed)
Pt last saw Dr Curt Bears 01/02/21, last labs 09/05/20 Creat 1.05, age 75, weight 98kg, based on specified criteria pt is on appropriate dosage of Eliquis 5mg  BID for afib.  Will refill rx.

## 2021-07-12 DIAGNOSIS — I10 Essential (primary) hypertension: Secondary | ICD-10-CM

## 2021-07-12 DIAGNOSIS — E785 Hyperlipidemia, unspecified: Secondary | ICD-10-CM

## 2021-07-12 DIAGNOSIS — I4891 Unspecified atrial fibrillation: Secondary | ICD-10-CM

## 2021-07-26 ENCOUNTER — Other Ambulatory Visit: Payer: Self-pay | Admitting: Family Medicine

## 2021-07-26 DIAGNOSIS — E038 Other specified hypothyroidism: Secondary | ICD-10-CM

## 2021-10-22 ENCOUNTER — Other Ambulatory Visit: Payer: Self-pay | Admitting: Cardiology

## 2021-10-22 DIAGNOSIS — I480 Paroxysmal atrial fibrillation: Secondary | ICD-10-CM

## 2021-10-23 NOTE — Telephone Encounter (Addendum)
Prescription refill request for Eliquis received. ?Indication: afib  ?Last office visit: Camnitz, 01/02/2021 ?Scr: 1.05, 09/05/2020 ?Age: 76 yo  ?Weight: 98 kg  ? ?Pt is overdue for blood work.  ? ?Called and spoke to pt. Scheduled pt to come in on 3/20 to get blood work. Pt stated he has a couple of weeks left of  Eliquis. Will refill when labs resulted.  ?

## 2021-10-30 ENCOUNTER — Other Ambulatory Visit: Payer: Self-pay

## 2021-10-30 ENCOUNTER — Encounter: Payer: Self-pay | Admitting: Family Medicine

## 2021-10-30 ENCOUNTER — Other Ambulatory Visit: Payer: Medicare Other | Admitting: *Deleted

## 2021-10-30 DIAGNOSIS — I480 Paroxysmal atrial fibrillation: Secondary | ICD-10-CM

## 2021-10-30 LAB — CBC
Hematocrit: 48 % (ref 37.5–51.0)
Hemoglobin: 16.3 g/dL (ref 13.0–17.7)
MCH: 29.4 pg (ref 26.6–33.0)
MCHC: 34 g/dL (ref 31.5–35.7)
MCV: 87 fL (ref 79–97)
Platelets: 353 10*3/uL (ref 150–450)
RBC: 5.55 x10E6/uL (ref 4.14–5.80)
RDW: 12.4 % (ref 11.6–15.4)
WBC: 6.1 10*3/uL (ref 3.4–10.8)

## 2021-10-30 LAB — BASIC METABOLIC PANEL
BUN/Creatinine Ratio: 10 (ref 10–24)
BUN: 12 mg/dL (ref 8–27)
CO2: 29 mmol/L (ref 20–29)
Calcium: 9.3 mg/dL (ref 8.6–10.2)
Chloride: 98 mmol/L (ref 96–106)
Creatinine, Ser: 1.25 mg/dL (ref 0.76–1.27)
Glucose: 109 mg/dL — ABNORMAL HIGH (ref 70–99)
Potassium: 4 mmol/L (ref 3.5–5.2)
Sodium: 139 mmol/L (ref 134–144)
eGFR: 59 mL/min/{1.73_m2} — ABNORMAL LOW (ref >59)

## 2021-10-31 NOTE — Telephone Encounter (Signed)
Pt had blood work done 10/30/21 Creat 1.25, age 76, weight 98kg, based on specified criteria pt is on appropriate dosage of Eliquis '5mg'$  BID for afib.  Will refill rx.  ?

## 2021-11-12 ENCOUNTER — Encounter: Payer: Self-pay | Admitting: Family Medicine

## 2021-11-29 ENCOUNTER — Other Ambulatory Visit: Payer: Self-pay | Admitting: Cardiology

## 2021-11-29 ENCOUNTER — Other Ambulatory Visit: Payer: Self-pay | Admitting: Family Medicine

## 2021-11-29 NOTE — Telephone Encounter (Signed)
Prescription refill request for Eliquis received. ?Indication: PAF ?Last office visit: 01/02/21  Elliot Cousin MD ?Scr: 1.25 on 10/30/21 ?Age: 76 ?Weight: 98kg ? ?Based on above findings Eliquis '5mg'$  twice daily is the appropriate dose.  Refill approved.  Pt is due to see Dr Curt Bears in May.  Message sent to schedulers to make appt. ? ?

## 2021-12-19 NOTE — Progress Notes (Deleted)
Chronic Care Management Pharmacy Note  12/19/2021 Name:  Stephen Morrison MRN:  [id number] DOB:  [birth year]  Subjective: Stephen Morrison is an 76 y.o. year old male who is a primary patient of [name], [name], MD.  The CCM team was consulted for assistance with disease management and care coordination needs.    Engaged with patient by telephone for follow up visit in response to provider referral for pharmacy case management and/or care coordination services.   Consent to Services:  The patient was given information about Chronic Care Management services, agreed to services, and gave verbal consent prior to initiation of services.  Please see initial visit note for detailed documentation.   Patient Care Team: [name], MD as PCP - General (Family Medicine) Constance Haw, MD as PCP - Cardiology (Cardiology) [name], MD (Inactive) as Consulting Physician (Urology) [name] [name], [facility] as Pharmacist (Pharmacist)  Hospital visits: None in previous 6 months  Objective: Lab Results  Component Value Date   CREATININE 1.25 10/30/2021   CREATININE 1.05 09/05/2020   CREATININE 1.11 09/18/2019   GFR 69.06 09/05/2020   GFR 64.53 09/18/2019   GFRNONAA 77 03/13/2019   GFRNONAA >60 11/05/2015  Last diabetic Eye exam: No results found for: HMDIABEYEEXA  Last diabetic Foot exam: No results found for: HMDIABFOOTEX  Lab Results  Component Value Date   CHOL 130 09/05/2020   CHOL 109 09/18/2019   TRIG 150.0 (H) 09/05/2020   TRIG 184.0 (H) 09/18/2019   HDL 37.70 (L) 09/05/2020   HDL 29.90 (L) 09/18/2019   CHOLHDL 3 09/05/2020   CHOLHDL 4 09/18/2019   VLDL 30.0 09/05/2020   VLDL 36.8 09/18/2019   LDLCALC 62 09/05/2020   LDLCALC 42 09/18/2019   LDLDIRECT 123.3 06/01/2013   LDLDIRECT 114.2 12/29/2012      Latest Ref Rng & Units 09/05/2020    8:16 AM 09/18/2019   12:51 PM 12/09/2017    9:20 AM  Hepatic Function  Total Protein 6.0 -  8.3 g/dL 7.7   7.5   7.0    Albumin 3.5 - 5.2 g/dL 4.8   4.3   4.3    AST 0 - 37 U/L '24   22   22    ' ALT 0 - 53 U/L '31   28   29    ' Alk Phosphatase 39 - 117 U/L 67   66   57    Total Bilirubin 0.2 - 1.2 mg/dL 1.0   0.9   0.9    Bilirubin, Direct 0.0 - 0.3 mg/dL 0.2   0.2   0.2     Lab Results  Component Value Date/Time   TSH 2.98 10/03/2020 08:51 AM   TSH 6.13 (H) 09/05/2020 08:16 AM   FREET4 0.78 06/01/2013 12:37 PM      Latest Ref Rng & Units 10/30/2021    9:09 AM 09/05/2020    8:16 AM 09/18/2019   12:51 PM  CBC  WBC 3.4 - 10.8 x10E3/uL 6.1   9.1   8.3    Hemoglobin 13.0 - 17.7 g/dL 16.3   17.3   16.4    Hematocrit 37.5 - 51.0 % 48.0   49.8   47.7    Platelets 150 - 450 x10E3/uL 353   366.0   354.0     No results found for: VD25OH  Clinical ASCVD:  The 10-year ASCVD risk score (Arnett DK, et al., 2019) is: 31.2%   Values used to calculate the  score:     Age: 89 years     Sex: Male     Is Non-Hispanic African American: No     Diabetic: No     Tobacco smoker: No     Systolic Blood Pressure: 024 mmHg     Is BP treated: Yes     HDL Cholesterol: 37.7 mg/dL     Total Cholesterol: 130 mg/dL    Social History   Tobacco Use  Smoking Status Former   Packs/day: 1.00   Years: 10.00   Pack years: 10.00   Types: Cigarettes   Quit date: 08/13/1973   Years since quitting: 48.3  Smokeless Tobacco Former   Types: Snuff, Chew   Quit date: 05/12/1974   BP Readings from Last 3 Encounters:  06/19/21 129/66  01/02/21 (!) 144/66  09/05/20 138/80   Pulse Readings from Last 3 Encounters:  01/02/21 (!) 59  09/05/20 (!) 56  09/28/19 (!) 57   Wt Readings from Last 3 Encounters:  01/02/21 216 lb (98 kg)  09/05/20 222 lb (100.7 kg)  02/08/20 223 lb (101.2 kg)    Assessment: Review of patient past medical history, allergies, medications, health status, including review of consultants reports, laboratory and other test data, was performed as part of comprehensive evaluation and  provision of chronic care management services.   SDOH:  (Social Determinants of Health) assessments and interventions performed: Yes.  CCM Care Plan Allergies  Allergen Reactions   Amoxicillin Swelling and Rash    Tongue swells Has patient had a PCN reaction causing immediate rash, facial/tongue/throat swelling, SOB or lightheadedness with hypotension: {YES Has patient had a PCN reaction causing severe rash involving mucus membranes or skin necrosis: {NO Has patient had a PCN reaction that required hospitalization {YES Has patient had a PCN reaction occurring within the last 10 years: {NO If all of the above answers are "NO", then may proceed with Cephalosporin use.    Medications Reviewed Today     Reviewed by [name], [facility] (Pharmacist) on 06/21/21 at [phone]  Med List Status: <None>   Medication Order Taking? Sig Documenting Provider Last Dose Status Informant  acetaminophen (TYLENOL) 650 MG CR tablet [id number]  Take 650 mg by mouth every 8 (eight) hours as needed for pain. [provider]  Active   amLODipine (NORVASC) 10 MG tablet [id number] Yes TAKE 1 TABLET BY MOUTH  DAILY [name], MD  Active   apixaban (ELIQUIS) 5 MG TABS tablet [id number] Yes TAKE 1 TABLET BY MOUTH  TWICE DAILY Camnitz, Will Hassell Done, MD  Active   atorvastatin (LIPITOR) 40 MG tablet [id number] Yes TAKE 1 TABLET BY MOUTH  DAILY [name], MD  Active   azelastine (ASTELIN) 0.1 % nasal spray [id number]  Place 1 spray into both nostrils 2 (two) times daily. Use in each nostril as directed Maximiano Coss, NP  Active   carvedilol (COREG) 3.125 MG tablet [id number] Yes TAKE 1 TABLET BY MOUTH  TWICE DAILY [name], MD  Active   cetirizine (ZYRTEC) 10 MG tablet [id number] Yes Take 10 mg by mouth as needed for allergies. [provider]  Active Self  colchicine 0.6 MG tablet [id number]  Take 1 tablet (0.6 mg total) by mouth 2 (two) times daily as needed (pain). Reported on  10/03/2015 [name], MD  Active   dextromethorphan-guaiFENesin [facility] DM) 30-600 MG 12hr tablet [id number]  Take 1 tablet by mouth 2 (two) times daily. Maximiano Coss, NP  Active   diclofenac Sodium (  VOLTAREN) 1 % GEL [id number]  Apply 4 g topically 4 (four) times daily. [provider]  Active Self  ezetimibe (ZETIA) 10 MG tablet [id number] Yes TAKE 1 TABLET BY MOUTH  DAILY [name], MD  Active    Discontinued 06/21/21 0824 (Change in therapy)   flecainide (TAMBOCOR) 50 MG tablet [id number] Yes TAKE 1 AND 1/2 TABLETS BY  MOUTH TWICE DAILY Camnitz, Will Hassell Done, MD  Active   furosemide (LASIX) 20 MG tablet [id number] Yes TAKE 1 TABLET BY MOUTH  DAILY [name], MD  Active   gabapentin (NEURONTIN) 300 MG capsule [id number] Yes TAKE 1 CAPSULE AT BEDTIME [name], MD  Active    Discontinued 06/21/21 [phone] (Patient Discharge)   hydrALAZINE (APRESOLINE) 10 MG tablet [id number] Yes TAKE 1 TABLET BY MOUTH  TWICE DAILY [name], MD  Active   ibuprofen (ADVIL) 200 MG tablet [id number]  Take 200 mg by mouth every 6 (six) hours as needed. [provider]  Active   levothyroxine (SYNTHROID) 75 MCG tablet [id number] Yes Take 1 tablet (75 mcg total) by mouth daily. [name], MD  Active   omeprazole (PRILOSEC) 20 MG capsule [id number] Yes Take 20 mg by mouth daily as needed (acid reflux). [provider]  Active Self  Thiamine HCl (VITAMIN B-1) 250 MG tablet [id number]  Take 250 mg by mouth daily. [provider]  Active   TURMERIC CURCUMIN PO [id number] Yes Take 1,000 mg by mouth daily. [provider]  Active   valsartan-hydrochlorothiazide (DIOVAN-HCT) 320-12.5 MG tablet [id number] Yes TAKE 1 TABLET BY MOUTH  DAILY [name], MD  Active            Patient Active Problem List   Diagnosis Date Noted   Urinary retention    GERD (gastroesophageal reflux disease)    Diverticulosis of colon    BPH  (benign prostatic hyperplasia)    Atrial fibrillation ([place])    Arthritis    Allergy    Colon cancer screening 05/29/2017   Chronic anticoagulation 05/29/2017   Lower leg edema 12/28/2016   Tick bite 02/25/2016   Erectile dysfunction 10/04/2015   Physical exam 03/31/2015   Benign prostatic hyperplasia with urinary retention 05/17/2014   Urinary retention due to benign prostatic hyperplasia 05/12/2014   Osteoarthritis 04/09/2014   LIPOMA 03/06/2010   FATTY LIVER DISEASE 03/06/2010   Hypothyroidism 10/25/2008   Hyperlipidemia 02/03/2007   GOUT 02/03/2007   Essential hypertension 02/03/2007   Immunization History  Administered Date(s) Administered   Influenza Split 06/27/2011, 07/02/2012   Influenza Whole 09/24/2007, 05/03/2008, 09/28/2009   Influenza, High Dose Seasonal PF 06/01/2013, 04/20/2020, 05/29/2021   Influenza,inj,Quad PF,6+ Mos 06/30/2014, 05/18/2015, 07/02/2016, 06/17/2017   Influenza-Unspecified 06/09/2018, 05/22/2021   PFIZER(Purple Top)SARS-COV-2 Vaccination 09/03/2019, 09/25/2019, 07/19/2020, 05/24/2021   Pfizer Covid-19 Vaccine Bivalent Booster 27yr & up 05/31/2021   Pneumococcal Conjugate-13 03/31/2015   Pneumococcal Polysaccharide-23 10/25/2008, 07/02/2016   Td 09/24/2007   Tdap 10/23/2021   Zoster, Live 05/14/2012    Conditions to be addressed/monitored: Afib, HTN, hypothyroidism, HLD, GERD, BPH, lower leg edema, ED, OA, fatty liver, gout   There are no care plans that you recently modified to display for this patient.  F/u" 3 month HC BP call, 6 month CPP telephone   Future Appointments  Date Time Provider [place] 12/27/2021 10:00 AM [facility] CCM PHARMACIST [facility]  03/05/2022 11:45 AM Camnitz, WOcie Doyne MD [facility]   CBeverly Milch PharmD Clinical Pharmacist  LVelora Heckler  [place] [phone]   Pharmacist Clinical Goal(s):  Over the next 365 days, patient will contact provider office for questions/concerns  as evidenced notation of same in electronic health record through collaboration with PharmD and provider.   Interventions: 1:1 collaboration with [name], MD regarding development and update of comprehensive plan of care as evidenced by provider attestation and co-signature Inter-disciplinary care team collaboration (see longitudinal plan of care) Comprehensive medication review performed; medication list updated in electronic medical record No Rx changes   Hypertension (BP goal <140/90) -Not ideally controlled in office, but controlled based on home readings <130/80 -Current treatment (Dr Birdie Riddle): Carvedilol 3.125 mg twice daily  Amlodipine 10 mg once daily  Furosemide 20 mg daily  Hydralazine 10 mg once daily Valsartan-HCTZ 320-12.5 mg once daily   -Previous swelling while taking amlodipine 10 mg (resolved).  -Denies hypotensive/hypertensive symptoms. No problems noted.  -Reports most recent home reading 129/66 06/19/2021.  -Current dietary habits: lots of vegetables, pork, avoids processed foods. Current exercise habits: golfing about 2x/week, yard work.  -Reviewed exercise goal of 150 minutes/wk -Counseled to monitor BP at home 1-2x/wk, document, and provide log at future appointments -Recommended to continue current medication  Hyperlipidemia: (LDL goal < 70) -Controlled -Current treatment: Atorvastatin 40 mg once daily  Zetia 10 mg daily  -Previously on ezetimibe-simvastatin 10-40 mg TIW (insurance issues due to being on atorvastatin already 10/2020) -Side effect review - no problems noted with current regimen  -Educated on Cholesterol goals and reviewed appropriate medication use.  -Recommended to continue current medication  Atrial Fibrillation (Goal: prevent stroke and major bleeding) -Controlled -Current treatment: Rate control: on carvedilol. Rhythm: flecainide 75 mg twice daily (Dr Curt Bears) Anticoagulation: Eliquis 5 mg twice daily (Dr  Curt Bears) -Medications previously tried: Xarelto (increased bleeding) -Reviewed financial concerns with eliquis - would typically spend $500 for 30 DS last two months of year. Does not qualify for patient assistance due to income. Referral to medicare counselor offered - declined today.  -Denies any side effects -Recommended to continue current medication  Patient Goals/Self-Care Activities Over the next 365 days, patient will:  - take medications as prescribed target a minimum of 150 minutes of moderate intensity exercise weekly  Medication Assistance: None required.  Patient affirms current coverage meets needs.

## 2021-12-20 ENCOUNTER — Telehealth: Payer: Medicare Other

## 2021-12-25 ENCOUNTER — Other Ambulatory Visit: Payer: Self-pay | Admitting: Family Medicine

## 2021-12-27 ENCOUNTER — Telehealth: Payer: Medicare Other

## 2021-12-27 DIAGNOSIS — L570 Actinic keratosis: Secondary | ICD-10-CM | POA: Diagnosis not present

## 2021-12-27 DIAGNOSIS — L812 Freckles: Secondary | ICD-10-CM | POA: Diagnosis not present

## 2021-12-27 DIAGNOSIS — L218 Other seborrheic dermatitis: Secondary | ICD-10-CM | POA: Diagnosis not present

## 2021-12-27 DIAGNOSIS — L821 Other seborrheic keratosis: Secondary | ICD-10-CM | POA: Diagnosis not present

## 2021-12-27 DIAGNOSIS — D1801 Hemangioma of skin and subcutaneous tissue: Secondary | ICD-10-CM | POA: Diagnosis not present

## 2021-12-27 NOTE — Progress Notes (Deleted)
Chronic Care Management Pharmacy Note  12/27/2021 Name:  Stephen Morrison MRN:  [id number] DOB:  [birth year]  Subjective: Stephen Morrison is an 76 y.o. year old male who is a primary patient of [name], [name], MD.  The CCM team was consulted for assistance with disease management and care coordination needs.    Engaged with patient by telephone for follow up visit in response to provider referral for pharmacy case management and/or care coordination services.   Consent to Services:  The patient was given information about Chronic Care Management services, agreed to services, and gave verbal consent prior to initiation of services.  Please see initial visit note for detailed documentation.   Patient Care Team: [name], MD as PCP - General (Family Medicine) Constance Haw, MD as PCP - Cardiology (Cardiology) [name], MD (Inactive) as Consulting Physician (Urology) [facility] [name], [facility] as Pharmacist (Pharmacist)  Hospital visits: None in previous 6 months  Objective: Lab Results  Component Value Date   CREATININE 1.25 10/30/2021   CREATININE 1.05 09/05/2020   CREATININE 1.11 09/18/2019   GFR 69.06 09/05/2020   GFR 64.53 09/18/2019   GFRNONAA 77 03/13/2019   GFRNONAA >60 11/05/2015  Last diabetic Eye exam: No results found for: HMDIABEYEEXA  Last diabetic Foot exam: No results found for: HMDIABFOOTEX  Lab Results  Component Value Date   CHOL 130 09/05/2020   CHOL 109 09/18/2019   TRIG 150.0 (H) 09/05/2020   TRIG 184.0 (H) 09/18/2019   HDL 37.70 (L) 09/05/2020   HDL 29.90 (L) 09/18/2019   CHOLHDL 3 09/05/2020   CHOLHDL 4 09/18/2019   VLDL 30.0 09/05/2020   VLDL 36.8 09/18/2019   LDLCALC 62 09/05/2020   LDLCALC 42 09/18/2019   LDLDIRECT 123.3 06/01/2013   LDLDIRECT 114.2 12/29/2012      Latest Ref Rng & Units 09/05/2020    8:16 AM 09/18/2019   12:51 PM 12/09/2017    9:20 AM  Hepatic Function  Total Protein 6.0 -  8.3 g/dL 7.7   7.5   7.0    Albumin 3.5 - 5.2 g/dL 4.8   4.3   4.3    AST 0 - 37 U/L '24   22   22    ' ALT 0 - 53 U/L '31   28   29    ' Alk Phosphatase 39 - 117 U/L 67   66   57    Total Bilirubin 0.2 - 1.2 mg/dL 1.0   0.9   0.9    Bilirubin, Direct 0.0 - 0.3 mg/dL 0.2   0.2   0.2     Lab Results  Component Value Date/Time   TSH 2.98 10/03/2020 08:51 AM   TSH 6.13 (H) 09/05/2020 08:16 AM   FREET4 0.78 06/01/2013 12:37 PM      Latest Ref Rng & Units 10/30/2021    9:09 AM 09/05/2020    8:16 AM 09/18/2019   12:51 PM  CBC  WBC 3.4 - 10.8 x10E3/uL 6.1   9.1   8.3    Hemoglobin 13.0 - 17.7 g/dL 16.3   17.3   16.4    Hematocrit 37.5 - 51.0 % 48.0   49.8   47.7    Platelets 150 - 450 x10E3/uL 353   366.0   354.0     No results found for: VD25OH  Clinical ASCVD:  The 10-year ASCVD risk score (Arnett DK, et al., 2019) is: 31.2%   Values used to calculate the  score:     Age: 32 years     Sex: Male     Is Non-Hispanic African American: No     Diabetic: No     Tobacco smoker: No     Systolic Blood Pressure: 449 mmHg     Is BP treated: Yes     HDL Cholesterol: 37.7 mg/dL     Total Cholesterol: 130 mg/dL    Social History   Tobacco Use  Smoking Status Former   Packs/day: 1.00   Years: 10.00   Pack years: 10.00   Types: Cigarettes   Quit date: 08/13/1973   Years since quitting: 48.4  Smokeless Tobacco Former   Types: Snuff, Chew   Quit date: 05/12/1974   BP Readings from Last 3 Encounters:  06/19/21 129/66  01/02/21 (!) 144/66  09/05/20 138/80   Pulse Readings from Last 3 Encounters:  01/02/21 (!) 59  09/05/20 (!) 56  09/28/19 (!) 57   Wt Readings from Last 3 Encounters:  01/02/21 216 lb (98 kg)  09/05/20 222 lb (100.7 kg)  02/08/20 223 lb (101.2 kg)    Assessment: Review of patient past medical history, allergies, medications, health status, including review of consultants reports, laboratory and other test data, was performed as part of comprehensive evaluation and  provision of chronic care management services.   SDOH:  (Social Determinants of Health) assessments and interventions performed: Yes.  CCM Care Plan Allergies  Allergen Reactions   Amoxicillin Swelling and Rash    Tongue swells Has patient had a PCN reaction causing immediate rash, facial/tongue/throat swelling, SOB or lightheadedness with hypotension: {YES Has patient had a PCN reaction causing severe rash involving mucus membranes or skin necrosis: {NO Has patient had a PCN reaction that required hospitalization {YES Has patient had a PCN reaction occurring within the last 10 years: {NO If all of the above answers are "NO", then may proceed with Cephalosporin use.    Medications Reviewed Today     Reviewed by [name], [facility] (Pharmacist) on 06/21/21 at [phone]  Med List Status: <None>   Medication Order Taking? Sig Documenting Provider Last Dose Status Informant  acetaminophen (TYLENOL) 650 MG CR tablet [id number]  Take 650 mg by mouth every 8 (eight) hours as needed for pain. [provider]  Active   amLODipine (NORVASC) 10 MG tablet [id number] Yes TAKE 1 TABLET BY MOUTH  DAILY [name], MD  Active   apixaban (ELIQUIS) 5 MG TABS tablet [id number] Yes TAKE 1 TABLET BY MOUTH  TWICE DAILY Camnitz, Will Hassell Done, MD  Active   atorvastatin (LIPITOR) 40 MG tablet [id number] Yes TAKE 1 TABLET BY MOUTH  DAILY [name], MD  Active   azelastine (ASTELIN) 0.1 % nasal spray [id number]  Place 1 spray into both nostrils 2 (two) times daily. Use in each nostril as directed Maximiano Coss, NP  Active   carvedilol (COREG) 3.125 MG tablet [id number] Yes TAKE 1 TABLET BY MOUTH  TWICE DAILY [name], MD  Active   cetirizine (ZYRTEC) 10 MG tablet [id number] Yes Take 10 mg by mouth as needed for allergies. [provider]  Active Self  colchicine 0.6 MG tablet [id number]  Take 1 tablet (0.6 mg total) by mouth 2 (two) times daily as needed (pain). Reported on  10/03/2015 [name], MD  Active   dextromethorphan-guaiFENesin [facility] DM) 30-600 MG 12hr tablet [id number]  Take 1 tablet by mouth 2 (two) times daily. Maximiano Coss, NP  Active   diclofenac Sodium (  VOLTAREN) 1 % GEL [id number]  Apply 4 g topically 4 (four) times daily. [provider]  Active Self  ezetimibe (ZETIA) 10 MG tablet [id number] Yes TAKE 1 TABLET BY MOUTH  DAILY [name], MD  Active    Discontinued 06/21/21 0824 (Change in therapy)   flecainide (TAMBOCOR) 50 MG tablet [id number] Yes TAKE 1 AND 1/2 TABLETS BY  MOUTH TWICE DAILY Camnitz, Will Hassell Done, MD  Active   furosemide (LASIX) 20 MG tablet [id number] Yes TAKE 1 TABLET BY MOUTH  DAILY [name], MD  Active   gabapentin (NEURONTIN) 300 MG capsule [id number] Yes TAKE 1 CAPSULE AT BEDTIME [name], MD  Active    Discontinued 06/21/21 [phone] (Patient Discharge)   hydrALAZINE (APRESOLINE) 10 MG tablet [id number] Yes TAKE 1 TABLET BY MOUTH  TWICE DAILY [name], MD  Active   ibuprofen (ADVIL) 200 MG tablet [id number]  Take 200 mg by mouth every 6 (six) hours as needed. [provider]  Active   levothyroxine (SYNTHROID) 75 MCG tablet [id number] Yes Take 1 tablet (75 mcg total) by mouth daily. [name], MD  Active   omeprazole (PRILOSEC) 20 MG capsule [id number] Yes Take 20 mg by mouth daily as needed (acid reflux). [provider]  Active Self  Thiamine HCl (VITAMIN B-1) 250 MG tablet [id number]  Take 250 mg by mouth daily. [provider]  Active   TURMERIC CURCUMIN PO [id number] Yes Take 1,000 mg by mouth daily. [provider]  Active   valsartan-hydrochlorothiazide (DIOVAN-HCT) 320-12.5 MG tablet [id number] Yes TAKE 1 TABLET BY MOUTH  DAILY [name], MD  Active            Patient Active Problem List   Diagnosis Date Noted   Urinary retention    GERD (gastroesophageal reflux disease)    Diverticulosis of colon    BPH  (benign prostatic hyperplasia)    Atrial fibrillation ([place])    Arthritis    Allergy    Colon cancer screening 05/29/2017   Chronic anticoagulation 05/29/2017   Lower leg edema 12/28/2016   Tick bite 02/25/2016   Erectile dysfunction 10/04/2015   Physical exam 03/31/2015   Benign prostatic hyperplasia with urinary retention 05/17/2014   Urinary retention due to benign prostatic hyperplasia 05/12/2014   Osteoarthritis 04/09/2014   LIPOMA 03/06/2010   FATTY LIVER DISEASE 03/06/2010   Hypothyroidism 10/25/2008   Hyperlipidemia 02/03/2007   GOUT 02/03/2007   Essential hypertension 02/03/2007   Immunization History  Administered Date(s) Administered   Influenza Split 06/27/2011, 07/02/2012   Influenza Whole 09/24/2007, 05/03/2008, 09/28/2009   Influenza, High Dose Seasonal PF 06/01/2013, 04/20/2020, 05/29/2021   Influenza,inj,Quad PF,6+ Mos 06/30/2014, 05/18/2015, 07/02/2016, 06/17/2017   Influenza-Unspecified 06/09/2018, 05/22/2021   PFIZER(Purple Top)SARS-COV-2 Vaccination 09/03/2019, 09/25/2019, 07/19/2020, 05/24/2021   Pfizer Covid-19 Vaccine Bivalent Booster 5yr & up 05/31/2021   Pneumococcal Conjugate-13 03/31/2015   Pneumococcal Polysaccharide-23 10/25/2008, 07/02/2016   Td 09/24/2007   Tdap 10/23/2021   Zoster, Live 05/14/2012    Conditions to be addressed/monitored: Afib, HTN, hypothyroidism, HLD, GERD, BPH, lower leg edema, ED, OA, fatty liver, gout   There are no care plans that you recently modified to display for this patient.  F/u" 3 month HC BP call, 6 month CPP telephone   Future Appointments  Date Time Provider [place] 12/27/2021  2:00 PM [facility] C[facility] PHARMACIST [facility]  03/05/2022 11:45 AM Camnitz, WOcie Doyne MD [facility]   CBeverly Milch PharmD Clinical Pharmacist  [employer] [phone]   Pharmacist Clinical Goal(s):  Over the next 365 days, patient will contact provider office for  questions/concerns as evidenced notation of same in electronic health record through collaboration with PharmD and provider.   Interventions: 1:1 collaboration with [name], MD regarding development and update of comprehensive plan of care as evidenced by provider attestation and co-signature Inter-disciplinary care team collaboration (see longitudinal plan of care) Comprehensive medication review performed; medication list updated in electronic medical record No Rx changes   Hypertension (BP goal <140/90) -Not ideally controlled in office, but controlled based on home readings <130/80 -Current treatment (Dr Birdie Riddle): Carvedilol 3.125 mg twice daily  Amlodipine 10 mg once daily  Furosemide 20 mg daily  Hydralazine 10 mg once daily Valsartan-HCTZ 320-12.5 mg once daily   -Previous swelling while taking amlodipine 10 mg (resolved).  -Denies hypotensive/hypertensive symptoms. No problems noted.  -Reports most recent home reading 129/66 06/19/2021.  -Current dietary habits: lots of vegetables, pork, avoids processed foods. Current exercise habits: golfing about 2x/week, yard work.  -Reviewed exercise goal of 150 minutes/wk -Counseled to monitor BP at home 1-2x/wk, document, and provide log at future appointments -Recommended to continue current medication  Hyperlipidemia: (LDL goal < 70) -Controlled -Current treatment: Atorvastatin 40 mg once daily  Zetia 10 mg daily  -Previously on ezetimibe-simvastatin 10-40 mg TIW (insurance issues due to being on atorvastatin already 10/2020) -Side effect review - no problems noted with current regimen  -Educated on Cholesterol goals and reviewed appropriate medication use.  -Recommended to continue current medication  Atrial Fibrillation (Goal: prevent stroke and major bleeding) -Controlled -Current treatment: Rate control: on carvedilol. Rhythm: flecainide 75 mg twice daily (Dr Curt Bears) Anticoagulation: Eliquis 5 mg twice daily (Dr  Curt Bears) -Medications previously tried: Xarelto (increased bleeding) -Reviewed financial concerns with eliquis - would typically spend $500 for 30 DS last two months of year. Does not qualify for patient assistance due to income. Referral to medicare counselor offered - declined today.  -Denies any side effects -Recommended to continue current medication  Patient Goals/Self-Care Activities Over the next 365 days, patient will:  - take medications as prescribed target a minimum of 150 minutes of moderate intensity exercise weekly  Medication Assistance: None required.  Patient affirms current coverage meets needs.

## 2022-02-09 ENCOUNTER — Other Ambulatory Visit: Payer: Self-pay | Admitting: Family Medicine

## 2022-02-09 DIAGNOSIS — E785 Hyperlipidemia, unspecified: Secondary | ICD-10-CM

## 2022-03-01 ENCOUNTER — Ambulatory Visit (INDEPENDENT_AMBULATORY_CARE_PROVIDER_SITE_OTHER): Payer: Medicare Other

## 2022-03-01 DIAGNOSIS — Z0000 Encounter for general adult medical examination without abnormal findings: Secondary | ICD-10-CM | POA: Diagnosis not present

## 2022-03-01 NOTE — Progress Notes (Signed)
Subjective:   Stephen Morrison is a 76 y.o. male who presents for an Subsequent Medicare Annual Wellness Visit.   I connected with [facility]  today by telephone and verified that I am speaking with the correct person using two identifiers. Location patient: home Location provider: work Persons participating in the virtual visit: patient, provider.   I discussed the limitations, risks, security and privacy concerns of performing an evaluation and management service by telephone and the availability of in person appointments. I also discussed with the patient that there may be a patient responsible charge related to this service. The patient expressed understanding and verbally consented to this telephonic visit.    Interactive audio and video telecommunications were attempted between this provider and patient, however failed, due to patient having technical difficulties OR patient did not have access to video capability.  We continued and completed visit with audio only.    Review of Systems     Cardiac Risk Factors include: advanced age (>28mn, >>31women)     Objective:    Today's Vitals   There is no height or weight on file to calculate BMI.     03/01/2022    3:33 PM 02/08/2020    9:06 AM 07/03/2017    9:27 AM 06/13/2017    9:58 AM 11/05/2015    2:59 AM 05/17/2014    7:53 AM 05/05/2014   11:13 PM  Advanced Directives  Does Patient Have a Medical Advance Directive? Yes Yes Yes Yes Yes Yes No  Type of AParamedicof APort [place]Living will [place] will [place]Living will Healthcare Power of [place]Living will [place]Living will   Copy of HE[place]onyin Chart? No - copy requested No - copy requested No - copy requested      Would patient like information on creating a medical advance directive?       No - patient declined information    Current Medications  (verified) Outpatient Encounter Medications as of 03/01/2022  Medication Sig   acetaminophen (TYLENOL) 650 MG CR tablet Take 650 mg by mouth every 8 (eight) hours as needed for pain.   amLODipine (NORVASC) 10 MG tablet TAKE 1 TABLET BY MOUTH DAILY   apixaban (ELIQUIS) 5 MG TABS tablet TAKE 1 TABLET BY MOUTH TWICE  DAILY   atorvastatin (LIPITOR) 40 MG tablet TAKE 1 TABLET BY MOUTH  DAILY   azelastine (ASTELIN) 0.1 % nasal spray Place 1 spray into both nostrils 2 (two) times daily. Use in each nostril as directed   carvedilol (COREG) 3.125 MG tablet TAKE 1 TABLET BY MOUTH  TWICE DAILY   cetirizine (ZYRTEC) 10 MG tablet Take 10 mg by mouth as needed for allergies.   colchicine 0.6 MG tablet Take 1 tablet (0.6 mg total) by mouth 2 (two) times daily as needed (pain). Reported on 10/03/2015   dextromethorphan-guaiFENesin (MUCINEX DM) 30-600 MG 12hr tablet Take 1 tablet by mouth 2 (two) times daily.   diclofenac Sodium (VOLTAREN) 1 % GEL Apply 4 g topically 4 (four) times daily.   ezetimibe (ZETIA) 10 MG tablet TAKE 1 TABLET BY MOUTH DAILY   flecainide (TAMBOCOR) 50 MG tablet TAKE 1 AND 1/2 TABLETS BY  MOUTH TWICE DAILY   furosemide (LASIX) 20 MG tablet TAKE 1 TABLET BY MOUTH  DAILY   gabapentin (NEURONTIN) 300 MG capsule TAKE 1 CAPSULE AT BEDTIME   hydrALAZINE (APRESOLINE) 10 MG tablet TAKE 1 TABLET BY  MOUTH  TWICE DAILY   ibuprofen (ADVIL) 200 MG tablet Take 200 mg by mouth every 6 (six) hours as needed.   levothyroxine (SYNTHROID) 75 MCG tablet TAKE 1 TABLET BY MOUTH  DAILY   omeprazole (PRILOSEC) 20 MG capsule Take 20 mg by mouth daily as needed (acid reflux).   TURMERIC CURCUMIN PO Take 1,000 mg by mouth daily.   valsartan-hydrochlorothiazide (DIOVAN-HCT) 320-12.5 MG tablet TAKE 1 TABLET BY MOUTH  DAILY   Thiamine HCl (VITAMIN B-1) 250 MG tablet Take 250 mg by mouth daily.   No facility-administered encounter medications on file as of 03/01/2022.    Allergies (verified) Amoxicillin    History: Past Medical History:  Diagnosis Date   Allergy    Arthritis    Atrial fibrillation ([place])    one time incident, on medication now with no further problems   BPH (benign prostatic hyperplasia)    Diverticulosis of colon    GERD (gastroesophageal reflux disease)    Gout    STABLE PER PT ON 05-12-2014   Hyperlipidemia    Hypertension    Hypothyroidism    PSA elevation    Tick bite 02/25/2016   Urinary retention    Past Surgical History:  Procedure Laterality Date   COLONOSCOPY  01-31-2007   EXCISION BENIGN CYST POSTERIOR NECK  2010   INSERTION OF SUPRAPUBIC CATHETER N/A 05/17/2014   Procedure: INSERTION OF SUPRAPUBIC CATHETER;  Surgeon: Bernestine Amass, MD;  Location: [facility];  Service: Urology;  Laterality: N/A;   ORIF RIGHT WRIST FX  2009   TRANSURETHRAL RESECTION OF PROSTATE N/A 05/17/2014   Procedure: TRANSURETHRAL RESECTION OF THE PROSTATE WITH GYRUS INSTRUMENTS;  Surgeon: Bernestine Amass, MD;  Location: [facility];  Service: Urology;  Laterality: N/A;   VASECTOMY     Family History  Problem Relation Age of Onset   Alcohol abuse Father    Dementia Father    Esophageal varices Father    Hyperlipidemia Mother    Hypertension Mother    Colon cancer Neg Hx    Esophageal cancer Neg Hx    Stomach cancer Neg Hx    Rectal cancer Neg Hx    Social History   Socioeconomic History   Marital status: Married    Spouse name: Not on file   Number of children: Not on file   Years of education: Not on file   Highest education level: Not on file  Occupational History   Occupation: retired  Tobacco Use   Smoking status: Former    Packs/day: 1.00    Years: 10.00    Total pack years: 10.00    Types: Cigarettes    Quit date: 08/13/1973    Years since quitting: 48.5   Smokeless tobacco: Former    Types: Snuff, Chew    Quit date: 05/12/1974  Vaping Use   Vaping Use: Never used  Substance and Sexual Activity   Alcohol use: Yes     Alcohol/week: 0.0 standard drinks of alcohol    Comment: OCCASIONALLY   Drug use: No   Sexual activity: Not on file  Other Topics Concern   Not on file  Social History Narrative   Not on file   Social Determinants of Health   Financial Resource Strain: Low Risk  (03/01/2022)   Overall Financial Resource Strain (CARDIA)    Difficulty of Paying Living Expenses: Not hard at all  Food Insecurity: No Food Insecurity (03/01/2022)   Hunger Vital Sign  Worried About Charity fundraiser in the Last Year: Never true    [place] in the Last Year: Never true  Transportation Needs: No Transportation Needs (03/01/2022)   PRAPARE - Hydrologist (Medical): No    Lack of Transportation (Non-Medical): No  Physical Activity: Insufficiently Active (03/01/2022)   Exercise Vital Sign    Days of Exercise per Week: 3 days    Minutes of Exercise per Session: 30 min  Stress: No Stress Concern Present (03/01/2022)   [place]    Feeling of Stress : Not at all  Social Connections: Moderately Isolated (03/01/2022)   Social Connection and Isolation Panel [NHANES]    Frequency of Communication with Friends and Family: Three times a week    Frequency of Social Gatherings with Friends and Family: Three times a week    Attends Religious Services: Never    Active Member of Clubs or Organizations: No    Attends Archivist Meetings: Never    Marital Status: Married    Tobacco Counseling Counseling given: Not Answered   Clinical Intake:  Pre-visit preparation completed: Yes  Pain : No/denies pain     Nutritional Risks: None Diabetes: No  How often do you need to have someone help you when you read instructions, pamphlets, or other written materials from your doctor or pharmacy?: 1 - Never What is the last grade level you completed in school?: [place]   Interpreter Needed?:  No  Information entered by :: L.Wilson,LPN   Activities of Daily Living    03/01/2022    3:35 PM 03/01/2022    2:41 PM  In your present state of health, do you have any difficulty performing the following activities:  Hearing? 0 0  Vision? 0 0  Difficulty concentrating or making decisions? 0 0  Walking or climbing stairs? 0 0  Dressing or bathing? 0 0  Doing errands, shopping? 0 0  Preparing Food and eating ? N N  Using the Toilet? N N  In the past six months, have you accidently leaked urine? N N  Do you have problems with loss of bowel control? N N  Managing your Medications? N N  Managing your Finances? N N  Housekeeping or managing your Housekeeping?  N    Patient Care Team: [name], MD as PCP - General (Family Medicine) Constance Haw, MD as PCP - Cardiology (Cardiology) [name], MD (Inactive) as Consulting Physician (Urology) [facility] [name], [facility] as Pharmacist (Pharmacist)  Indicate any recent Medical Services you may have received from other than [facility] providers in the past year (date may be approximate).     Assessment:   This is a routine wellness examination for [place].  Hearing/Vision screen Vision Screening - Comments:: Declined   Dietary issues and exercise activities discussed: Current Exercise Habits: Home exercise routine, Type of exercise: walking, Time (Minutes): 30, Frequency (Times/Week): 3, Weekly Exercise (Minutes/Week): 90, Intensity: Mild, Exercise limited by: None identified   Goals Addressed   None    Depression Screen    03/01/2022    3:34 PM 03/01/2022    3:32 PM 06/02/2021    8:50 AM 02/27/2021   10:56 AM 09/05/2020    7:36 AM 02/08/2020    9:09 AM 09/16/2019   10:33 AM  PHQ 2/9 Scores  PHQ - 2 Score 0 0 0 0 0 0 0  PHQ- 9 Score   0  0  0    Fall Risk    03/01/2022    3:34 PM 03/01/2022    2:41 PM 06/02/2021    8:50 AM 02/27/2021   10:58 AM 09/05/2020    7:35 AM  Fall Risk    Falls in the past year? 0 0 0 0 0  Number falls in past yr: 0  0 0 0  Injury with Fall? 0  0 0 0  Risk for fall due to :   No Fall Risks  No Fall Risks  Follow up Falls evaluation completed;Education provided  Falls evaluation completed Falls evaluation completed;Falls prevention discussed     FALL RISK PREVENTION PERTAINING TO THE HOME:  Any stairs in or around the home? No  If so, are there any without handrails? No  Home free of loose throw rugs in walkways, pet beds, electrical cords, etc? Yes  Adequate lighting in your home to reduce risk of falls? Yes   ASSISTIVE DEVICES UTILIZED TO PREVENT FALLS:  Life alert? No  Use of a cane, walker or w/c? No  Grab bars in the bathroom? Yes  Shower chair or bench in shower? Yes  Elevated toilet seat or a handicapped toilet? Yes     Cognitive Function:  Normal cognitive status assessed by telephone conversation by this Nurse Health Advisor. No abnormalities found.      07/03/2017    9:28 AM  MMSE - Mini Mental State Exam  Orientation to time 5  Orientation to Place 5  Registration 3  Attention/ Calculation 0  Recall 3  Language- name 2 objects 2  Language- repeat 1  Language- follow 3 step command 3  Language- read & follow direction 1  Write a sentence 1  Copy design 1  Total score 25        02/08/2020    9:15 AM  6CIT Screen  What Year? 0 points  What month? 0 points  What time? 0 points  Count back from 20 0 points  Months in reverse 0 points  Repeat phrase 0 points  Total Score 0 points    Immunizations Immunization History  Administered Date(s) Administered   Influenza Split 06/27/2011, 07/02/2012   Influenza Whole 09/24/2007, 05/03/2008, 09/28/2009   Influenza, High Dose Seasonal PF 06/01/2013, 04/20/2020, 05/29/2021   Influenza,inj,Quad PF,6+ Mos 06/30/2014, 05/18/2015, 07/02/2016, 06/17/2017   Influenza-Unspecified 06/09/2018, 05/22/2021   PFIZER(Purple Top)SARS-COV-2 Vaccination 09/03/2019,  09/25/2019, 07/19/2020, 05/24/2021   Pfizer Covid-19 Vaccine Bivalent Booster 34yr & up 05/31/2021   Pneumococcal Conjugate-13 03/31/2015   Pneumococcal Polysaccharide-23 10/25/2008, 07/02/2016   Td 09/24/2007   Tdap 10/23/2021   Zoster, Live 05/14/2012    TDAP status: Up to date  Flu Vaccine status: Up to date  Pneumococcal vaccine status: Up to date  Covid-19 vaccine status: Completed vaccines  Qualifies for Shingles Vaccine? Yes   Zostavax completed No   Shingrix Completed?: No.    Education has been provided regarding the importance of this vaccine. Patient has been advised to call insurance company to determine out of pocket expense if they have not yet received this vaccine. Advised may also receive vaccine at local pharmacy or Health Dept. Verbalized acceptance and understanding.  Screening Tests Health Maintenance  Topic Date Due   Zoster Vaccines- Shingrix (1 of 2) Never done   COVID-19 Vaccine (5 - Pfizer series) 07/26/2021   INFLUENZA VACCINE  03/13/2022   TETANUS/TDAP  10/24/2031   Pneumonia Vaccine 76 Years old  Completed   Hepatitis C Screening  Completed   HPV VACCINES  Aged Out   COLONOSCOPY (Pts 45-32yr Insurance coverage will need to be confirmed)  Discontinued    Health Maintenance  Health Maintenance Due  Topic Date Due   Zoster Vaccines- Shingrix (1 of 2) Never done   COVID-19 Vaccine (5 - Pfizer series) 07/26/2021    Colorectal cancer screening: No longer required.   Lung Cancer Screening: (Low Dose CT Chest recommended if Age 78-80years, 30 pack-year currently smoking OR have quit w/in 15years.) does not qualify.   Lung Cancer Screening Referral: n/a  Additional Screening:  Hepatitis C Screening: does not qualify;   Vision Screening: Recommended annual ophthalmology exams for early detection of glaucoma and other disorders of the eye. Is the patient up to date with their annual eye exam?  No  Who is the provider or what is the name of  the office in which the patient attends annual eye exams? Declined  If pt is not established with a provider, would they like to be referred to a provider to establish care? No .   Dental Screening: Recommended annual dental exams for proper oral hygiene  Community Resource Referral / Chronic Care Management: CRR required this visit?  No   CCM required this visit?  No      Plan:     I have personally reviewed and noted the following in the patient's chart:   Medical and social history Use of alcohol, tobacco or illicit drugs  Current medications and supplements including opioid prescriptions. Patient is not currently taking opioid prescriptions. Functional ability and status Nutritional status Physical activity Advanced directives List of other physicians Hospitalizations, surgeries, and ER visits in previous 12 months Vitals Screenings to include cognitive, depression, and falls Referrals and appointments  In addition, I have reviewed and discussed with patient certain preventive protocols, quality metrics, and best practice recommendations. A written personalized care plan for preventive services as well as general preventive health recommendations were provided to patient.     LRandel Pigg LPN   [id number]  Nurse Notes: none

## 2022-03-01 NOTE — Patient Instructions (Signed)
[name] , Thank you for taking time to come for your Medicare Wellness Visit. I appreciate your ongoing commitment to your health goals. Please review the following plan we discussed and let me know if I can assist you in the future.   Screening recommendations/referrals: Colonoscopy: no longer required  Recommended yearly ophthalmology/optometry visit for glaucoma screening and checkup Recommended yearly dental visit for hygiene and checkup  Vaccinations: Influenza vaccine: completed  Pneumococcal vaccine: completed  Tdap vaccine: 10/23/2021 Shingles vaccine: will consider     Advanced directives: yes   Conditions/risks identified: none   Next appointment: none   Preventive Care 8 Years and Older, Male Preventive care refers to lifestyle choices and visits with your health care provider that can promote health and wellness. What does preventive care include? A yearly physical exam. This is also called an annual well check. Dental exams once or twice a year. Routine eye exams. Ask your health care provider how often you should have your eyes checked. Personal lifestyle choices, including: Daily care of your teeth and gums. Regular physical activity. Eating a healthy diet. Avoiding tobacco and drug use. Limiting alcohol use. Practicing safe sex. Taking low doses of aspirin every day. Taking vitamin and mineral supplements as recommended by your health care provider. What happens during an annual well check? The services and screenings done by your health care provider during your annual well check will depend on your age, overall health, lifestyle risk factors, and family history of disease. Counseling  Your health care provider may ask you questions about your: Alcohol use. Tobacco use. Drug use. Emotional well-being. Home and relationship well-being. Sexual activity. Eating habits. History of falls. Memory and ability to understand (cognition). Work and work  Statistician. Screening  You may have the following tests or measurements: Height, weight, and BMI. Blood pressure. Lipid and cholesterol levels. These may be checked every 5 years, or more frequently if you are over 77 years old. Skin check. Lung cancer screening. You may have this screening every year starting at age 62 if you have a 30-pack-year history of smoking and currently smoke or have quit within the past 15 years. Fecal occult blood test (FOBT) of the stool. You may have this test every year starting at age 43. Flexible sigmoidoscopy or colonoscopy. You may have a sigmoidoscopy every 5 years or a colonoscopy every 10 years starting at age 45. Prostate cancer screening. Recommendations will vary depending on your family history and other risks. Hepatitis C blood test. Hepatitis B blood test. Sexually transmitted disease (STD) testing. Diabetes screening. This is done by checking your blood sugar (glucose) after you have not eaten for a while (fasting). You may have this done every 1-3 years. Abdominal aortic aneurysm (AAA) screening. You may need this if you are a current or former smoker. Osteoporosis. You may be screened starting at age 54 if you are at high risk. Talk with your health care provider about your test results, treatment options, and if necessary, the need for more tests. Vaccines  Your health care provider may recommend certain vaccines, such as: Influenza vaccine. This is recommended every year. Tetanus, diphtheria, and acellular pertussis (Tdap, Td) vaccine. You may need a Td booster every 10 years. Zoster vaccine. You may need this after age 35. Pneumococcal 13-valent conjugate (PCV13) vaccine. One dose is recommended after age 62. Pneumococcal polysaccharide (PPSV23) vaccine. One dose is recommended after age 30. Talk to your health care provider about which screenings and vaccines you need and  how often you need them. This information is not intended to replace  advice given to you by your health care provider. Make sure you discuss any questions you have with your health care provider. Document Released: 08/26/2015 Document Revised: 04/18/2016 Document Reviewed: 05/31/2015 Elsevier Interactive Patient Education  2017 [place] Prevention in the Home Falls can cause injuries. They can happen to people of all ages. There are many things you can do to make your home safe and to help prevent falls. What can I do on the outside of my home? Regularly fix the edges of walkways and driveways and fix any cracks. Remove anything that might make you trip as you walk through a door, such as a raised step or threshold. Trim any bushes or trees on the path to your home. Use bright outdoor lighting. Clear any walking paths of anything that might make someone trip, such as rocks or tools. Regularly check to see if handrails are loose or broken. Make sure that both sides of any steps have handrails. Any raised decks and porches should have guardrails on the edges. Have any leaves, snow, or ice cleared regularly. Use sand or salt on walking paths during winter. Clean up any spills in your garage right away. This includes oil or grease spills. What can I do in the bathroom? Use night lights. Install grab bars by the toilet and in the tub and shower. Do not use towel bars as grab bars. Use non-skid mats or decals in the tub or shower. If you need to sit down in the shower, use a plastic, non-slip stool. Keep the floor dry. Clean up any water that spills on the floor as soon as it happens. Remove soap buildup in the tub or shower regularly. Attach bath mats securely with double-sided non-slip rug tape. Do not have throw rugs and other things on the floor that can make you trip. What can I do in the bedroom? Use night lights. Make sure that you have a light by your bed that is easy to reach. Do not use any sheets or blankets that are too big for your bed.  They should not hang down onto the floor. Have a firm chair that has side arms. You can use this for support while you get dressed. Do not have throw rugs and other things on the floor that can make you trip. What can I do in the kitchen? Clean up any spills right away. Avoid walking on wet floors. Keep items that you use a lot in easy-to-reach places. If you need to reach something above you, use a strong step stool that has a grab bar. Keep electrical cords out of the way. Do not use floor polish or wax that makes floors slippery. If you must use wax, use non-skid floor wax. Do not have throw rugs and other things on the floor that can make you trip. What can I do with my stairs? Do not leave any items on the stairs. Make sure that there are handrails on both sides of the stairs and use them. Fix handrails that are broken or loose. Make sure that handrails are as long as the stairways. Check any carpeting to make sure that it is firmly attached to the stairs. Fix any carpet that is loose or worn. Avoid having throw rugs at the top or bottom of the stairs. If you do have throw rugs, attach them to the floor with carpet tape. Make sure that you have  a light switch at the top of the stairs and the bottom of the stairs. If you do not have them, ask someone to add them for you. What else can I do to help prevent falls? Wear shoes that: Do not have high heels. Have rubber bottoms. Are comfortable and fit you well. Are closed at the toe. Do not wear sandals. If you use a stepladder: Make sure that it is fully opened. Do not climb a closed stepladder. Make sure that both sides of the stepladder are locked into place. Ask someone to hold it for you, if possible. Clearly mark and make sure that you can see: Any grab bars or handrails. First and last steps. Where the edge of each step is. Use tools that help you move around (mobility aids) if they are needed. These  include: Canes. Walkers. Scooters. Crutches. Turn on the lights when you go into a dark area. Replace any light bulbs as soon as they burn out. Set up your furniture so you have a clear path. Avoid moving your furniture around. If any of your floors are uneven, fix them. If there are any pets around you, be aware of where they are. Review your medicines with your doctor. Some medicines can make you feel dizzy. This can increase your chance of falling. Ask your doctor what other things that you can do to help prevent falls. This information is not intended to replace advice given to you by your health care provider. Make sure you discuss any questions you have with your health care provider. Document Released: 05/26/2009 Document Revised: 01/05/2016 Document Reviewed: 09/03/2014 Elsevier Interactive Patient Education  2017 [employer].

## 2022-03-05 ENCOUNTER — Ambulatory Visit: Payer: Medicare Other | Admitting: Cardiology

## 2022-03-05 ENCOUNTER — Encounter: Payer: Self-pay | Admitting: Cardiology

## 2022-03-05 VITALS — BP 130/74 | HR 63 | Ht 70.5 in | Wt 219.2 lb

## 2022-03-05 DIAGNOSIS — I480 Paroxysmal atrial fibrillation: Secondary | ICD-10-CM | POA: Diagnosis not present

## 2022-03-05 DIAGNOSIS — D6869 Other thrombophilia: Secondary | ICD-10-CM

## 2022-03-05 NOTE — Progress Notes (Signed)
Electrophysiology Office Note   Date:  03/05/2022   ID:  Stephen Morrison, Stephen Morrison 1946-05-30, MRN [id number]  PCP:  [name], MD  Primary Electrophysiologist:  Constance Haw, MD    No chief complaint on file.    History of Present Illness: Stephen Morrison is a 76 y.o. male who presents today for electrophysiology evaluation.     He has a history significant for hypertension, hyperlipidemia, atrial fibrillation.  He presented to the emergency room with an hour palpitations.  Palpitations woke him from sleep.  He converted to sinus rhythm in the emergency room.  He was started on flecainide.  Today, denies symptoms of palpitations, chest pain, shortness of breath, orthopnea, PND, lower extremity edema, claudication, dizziness, presyncope, syncope, bleeding, or neurologic sequela. The patient is tolerating medications without difficulties.  Since being seen he has done well.  He has noted no further episodes of atrial fibrillation.  He is overall quite happy with his control.  Past Medical History:  Diagnosis Date   Allergy    Arthritis    Atrial fibrillation ([place])    one time incident, on medication now with no further problems   BPH (benign prostatic hyperplasia)    Diverticulosis of colon    GERD (gastroesophageal reflux disease)    Gout    STABLE PER PT ON 05-12-2014   Hyperlipidemia    Hypertension    Hypothyroidism    PSA elevation    Tick bite 02/25/2016   Urinary retention    Past Surgical History:  Procedure Laterality Date   COLONOSCOPY  01-31-2007   EXCISION BENIGN CYST POSTERIOR NECK  2010   INSERTION OF SUPRAPUBIC CATHETER N/A 05/17/2014   Procedure: INSERTION OF SUPRAPUBIC CATHETER;  Surgeon: Bernestine Amass, MD;  Location[facility]  Service: Urology;  Laterality: N/A;   ORIF RIGHT WRIST FX  2009   TRANSURETHRAL RESECTION OF PROSTATE N/A 05/17/2014   Procedure: TRANSURETHRAL RESECTION OF THE PROSTATE WITH GYRUS INSTRUMENTS;  Surgeon:  Bernestine Amass, MD;  Location: [facility];  Service: Urology;  Laterality: N/A;   VASECTOMY       Current Outpatient Medications  Medication Sig Dispense Refill   acetaminophen (TYLENOL) 650 MG CR tablet Take 650 mg by mouth every 8 (eight) hours as needed for pain.     amLODipine (NORVASC) 10 MG tablet TAKE 1 TABLET BY MOUTH DAILY 30 tablet 11   apixaban (ELIQUIS) 5 MG TABS tablet TAKE 1 TABLET BY MOUTH TWICE  DAILY 180 tablet 1   atorvastatin (LIPITOR) 40 MG tablet TAKE 1 TABLET BY MOUTH  DAILY 90 tablet 1   azelastine (ASTELIN) 0.1 % nasal spray Place 1 spray into both nostrils 2 (two) times daily. Use in each nostril as directed 30 mL 12   carvedilol (COREG) 3.125 MG tablet TAKE 1 TABLET BY MOUTH  TWICE DAILY 60 tablet 0   cetirizine (ZYRTEC) 10 MG tablet Take 10 mg by mouth as needed for allergies.     colchicine 0.6 MG tablet Take 1 tablet (0.6 mg total) by mouth 2 (two) times daily as needed (pain). Reported on 10/03/2015 30 tablet 3   dextromethorphan-guaiFENesin (MUCINEX DM) 30-600 MG 12hr tablet Take 1 tablet by mouth 2 (two) times daily. 20 tablet 0   diclofenac Sodium (VOLTAREN) 1 % GEL Apply 4 g topically 4 (four) times daily.     ezetimibe (ZETIA) 10 MG tablet TAKE 1 TABLET BY MOUTH DAILY 30 tablet 11  flecainide (TAMBOCOR) 50 MG tablet TAKE 1 AND 1/2 TABLETS BY  MOUTH TWICE DAILY 90 tablet 0   furosemide (LASIX) 20 MG tablet TAKE 1 TABLET BY MOUTH  DAILY 90 tablet 0   gabapentin (NEURONTIN) 300 MG capsule TAKE 1 CAPSULE AT BEDTIME 90 capsule 1   hydrALAZINE (APRESOLINE) 10 MG tablet TAKE 1 TABLET BY MOUTH  TWICE DAILY 200 tablet 1   ibuprofen (ADVIL) 200 MG tablet Take 200 mg by mouth every 6 (six) hours as needed.     levothyroxine (SYNTHROID) 75 MCG tablet TAKE 1 TABLET BY MOUTH  DAILY 90 tablet 3   omeprazole (PRILOSEC) 20 MG capsule Take 20 mg by mouth daily as needed (acid reflux).     Thiamine HCl (VITAMIN B-1) 250 MG tablet Take 250 mg by mouth daily.      TURMERIC CURCUMIN PO Take 1,000 mg by mouth daily.     valsartan-hydrochlorothiazide (DIOVAN-HCT) 320-12.5 MG tablet TAKE 1 TABLET BY MOUTH  DAILY 90 tablet 1   No current facility-administered medications for this visit.    Allergies:   Amoxicillin   Social History:  The patient  reports that he quit smoking about 48 years ago. His smoking use included cigarettes. He has a 10.00 pack-year smoking history. He quit smokeless tobacco use about 47 years ago.  His smokeless tobacco use included snuff and chew. He reports current alcohol use. He reports that he does not use drugs.   Family History:  The patient's family history includes Alcohol abuse in his father; Dementia in his father; Esophageal varices in his father; Hyperlipidemia in his mother; Hypertension in his mother.   ROS:  Please see the history of present illness.   Otherwise, review of systems is positive for none.   All other systems are reviewed and negative.   PHYSICAL EXAM: VS:  BP 130/74   Pulse 63   Ht 5' 10.5" (1.791 m)   Wt 219 lb 3.2 oz (99.4 kg)   SpO2 97%   BMI 31.01 kg/m  , BMI Body mass index is 31.01 kg/m. GEN: Well nourished, well developed, in no acute distress  HEENT: normal  Neck: no JVD, carotid bruits, or masses Cardiac: RRR; no murmurs, rubs, or gallops,no edema  Respiratory:  clear to auscultation bilaterally, normal work of breathing GI: soft, nontender, nondistended, + BS MS: no deformity or atrophy  Skin: warm and dry Neuro:  Strength and sensation are intact Psych: euthymic mood, full affect  EKG:  EKG is ordered today. Personal review of the ekg ordered shows sinus rhythm, rate 63, first-degree AV block  Recent Labs: 10/30/2021: BUN 12; Creatinine, Ser 1.25; Hemoglobin 16.3; Platelets 353; Potassium 4.0; Sodium 139    Lipid Panel     Component Value Date/Time   CHOL 130 09/05/2020 0816   TRIG 150.0 (H) 09/05/2020 0816   HDL 37.70 (L) 09/05/2020 0816   CHOLHDL 3 09/05/2020 0816    VLDL 30.0 09/05/2020 0816   LDLCALC 62 09/05/2020 0816   LDLDIRECT 123.3 06/01/2013 1237     Wt Readings from Last 3 Encounters:  03/05/22 219 lb 3.2 oz (99.4 kg)  01/02/21 216 lb (98 kg)  09/05/20 222 lb (100.7 kg)      Other studies Reviewed: Additional studies/ records that were reviewed today include:  TTE 12/09/15 - Left ventricle: The cavity size was normal. Wall thickness was   increased in a pattern of mild LVH. Systolic function was normal.   The estimated ejection fraction was in the  range of 60% to 65%.   Wall motion was normal; there were no regional wall motion   abnormalities.    ASSESSMENT AND PLAN:  1.  Paroxysmal atrial fibrillation: Currently on Eliquis 5 mg twice daily, carvedilol 3.125 mg twice daily, flecainide 75 mg twice daily.  CHA2DS2-VASc of 3.  High risk medication monitoring with flecainide.  He remains in sinus rhythm.  His QRS has remained narrow on his current dose of flecainide.  We Myrlene Riera continue with current management.  2.  Hypertension: Currently well controlled  3.  Secondary hypercoagulable state: Currently on Eliquis for atrial fibrillation as above  Current medicines are reviewed at length with the patient today.   The patient does not have concerns regarding his medicines.  The following changes were made today: None  Labs/ tests ordered today include:  Orders Placed This Encounter  Procedures   EKG 12-Lead     Disposition:   FU with Kaileen Bronkema 12 months  Signed, Rowyn Mustapha Meredith Leeds, MD  03/05/2022 11:51 AM     [address] [place] [address] [phone] (office) [phone] (fax)

## 2022-03-05 NOTE — Patient Instructions (Signed)
Medication Instructions:  Your physician recommends that you continue on your current medications as directed. Please refer to the Current Medication list given to you today.  *If you need a refill on your cardiac medications before your next appointment, please call your pharmacy*   Lab Work: None ordered If you have labs (blood work) drawn today and your tests are completely normal, you will receive your results only by: [place] (if you have MyChart) OR A paper copy in the mail If you have any lab test that is abnormal or we need to change your treatment, we will call you to review the results.   Testing/Procedures: None ordered   Follow-Up: At [facility], you and your health needs are our priority.  As part of our continuing mission to provide you with exceptional heart care, we have created designated Provider Care Teams.  These Care Teams include your primary Cardiologist (physician) and Advanced Practice Providers (APPs -  Physician Assistants and Nurse Practitioners) who all work together to provide you with the care you need, when you need it.  We recommend signing up for the patient portal called "MyChart".  Sign up information is provided on this After Visit Summary.  MyChart is used to connect with patients for Virtual Visits (Telemedicine).  Patients are able to view lab/test results, encounter notes, upcoming appointments, etc.  Non-urgent messages can be sent to your provider as well.   To learn more about what you can do with MyChart, go to [email or web address].    Your next appointment:   1 year(s)  The format for your next appointment:   In Person  Provider:   Allegra Lai, MD    Thank you for choosing [facility]!!   Trinidad Curet, RN [phone]  Other Instructions    Important Information About Sugar

## 2022-03-13 ENCOUNTER — Other Ambulatory Visit: Payer: Self-pay | Admitting: Cardiology

## 2022-03-13 ENCOUNTER — Other Ambulatory Visit: Payer: Self-pay | Admitting: Family Medicine

## 2022-03-13 ENCOUNTER — Other Ambulatory Visit: Payer: Self-pay

## 2022-03-13 MED ORDER — CARVEDILOL 3.125 MG PO TABS: 3.1250 mg | ORAL_TABLET | Freq: Two times a day (BID) | ORAL | 0 refills | Status: DC

## 2022-03-14 ENCOUNTER — Other Ambulatory Visit: Payer: Self-pay

## 2022-03-14 ENCOUNTER — Telehealth: Payer: Self-pay | Admitting: Family Medicine

## 2022-03-14 MED ORDER — CARVEDILOL 3.125 MG PO TABS: 3.1250 mg | ORAL_TABLET | Freq: Two times a day (BID) | ORAL | 0 refills | Status: DC

## 2022-03-14 NOTE — Telephone Encounter (Signed)
[facility] 

## 2022-03-14 NOTE — Telephone Encounter (Signed)
Pt called in stating that he would like his Carvedilol sent [facility] not [facility], please advise

## 2022-04-12 ENCOUNTER — Other Ambulatory Visit: Payer: Self-pay | Admitting: Family Medicine

## 2022-04-19 ENCOUNTER — Other Ambulatory Visit: Payer: Self-pay | Admitting: Family Medicine

## 2022-04-19 DIAGNOSIS — E785 Hyperlipidemia, unspecified: Secondary | ICD-10-CM

## 2022-04-19 DIAGNOSIS — E038 Other specified hypothyroidism: Secondary | ICD-10-CM

## 2022-04-23 ENCOUNTER — Other Ambulatory Visit: Payer: Self-pay | Admitting: Family Medicine

## 2022-04-23 ENCOUNTER — Other Ambulatory Visit: Payer: Self-pay | Admitting: Lab

## 2022-04-23 DIAGNOSIS — I10 Essential (primary) hypertension: Secondary | ICD-10-CM

## 2022-04-23 MED ORDER — CARVEDILOL 3.125 MG PO TABS: 3.1250 mg | ORAL_TABLET | Freq: Two times a day (BID) | ORAL | 0 refills | Status: DC

## 2022-05-30 ENCOUNTER — Other Ambulatory Visit: Payer: Self-pay | Admitting: Cardiology

## 2022-05-30 NOTE — Telephone Encounter (Signed)
Prescription refill request for Eliquis received. Indication: PAF Last office visit: 03/05/22  Elliot Cousin MD Scr: 1.25 on 10/30/21 Age: 76 Weight: 99.4kg  Based on above findings Eliquis '5mg'$  twice daily is the appropriate dose.  Refill approved.

## 2022-08-03 ENCOUNTER — Telehealth: Payer: Self-pay | Admitting: Family Medicine

## 2022-08-03 ENCOUNTER — Encounter (INDEPENDENT_AMBULATORY_CARE_PROVIDER_SITE_OTHER): Payer: Medicare Other | Admitting: Family Medicine

## 2022-08-03 DIAGNOSIS — J40 Bronchitis, not specified as acute or chronic: Secondary | ICD-10-CM

## 2022-08-03 MED ORDER — DOXYCYCLINE HYCLATE 100 MG PO TABS: 100.0000 mg | ORAL_TABLET | Freq: Two times a day (BID) | ORAL | 0 refills | Status: DC

## 2022-08-03 MED ORDER — GUAIFENESIN-CODEINE 100-10 MG/5ML PO SYRP: 10.0000 mL | ORAL_SOLUTION | Freq: Three times a day (TID) | ORAL | 0 refills | Status: DC | PRN

## 2022-08-03 NOTE — Telephone Encounter (Signed)
Caller name: ERION WEIGHTMAN  On [place]?: Yes  Call back number: [phone] (mobile)  Provider they see: [name], MD  Reason for call: Patient states that he has a sinus infection. Patient wants to know what he can take otc.

## 2022-08-03 NOTE — Telephone Encounter (Signed)
Any recommendations we can make? No appt available

## 2022-08-03 NOTE — Telephone Encounter (Signed)
If there are any virtual appts anywhere in the [facility] system or even [facility] he can schedule with them.  If none available, he needs to send me a MyChart message detailing his symptoms and the timeline of his illness so I can try and treat appropriately

## 2022-08-03 NOTE — Telephone Encounter (Signed)
Pt will send message shortly

## 2022-08-03 NOTE — Telephone Encounter (Signed)
[facility] VISIT   Patient agreed to [facility] visit and is aware that copayment and coinsurance may apply. Patient was treated using telemedicine according to accepted telemedicine protocols.  Subjective:   Patient complains of cough, chest congestion, low grade fever  Patient Active Problem List   Diagnosis Date Noted   Urinary retention    GERD (gastroesophageal reflux disease)    Diverticulosis of colon    BPH (benign prostatic hyperplasia)    Atrial fibrillation (H[facility])    Arthritis    Allergy    Colon cancer screening 05/29/2017   Chronic anticoagulation 05/29/2017   Lower leg edema 12/28/2016   Tick bite 02/25/2016   Erectile dysfunction 10/04/2015   Physical exam 03/31/2015   Benign prostatic hyperplasia with urinary retention 05/17/2014   Urinary retention due to benign prostatic hyperplasia 05/12/2014   Osteoarthritis 04/09/2014   LIPOMA 03/06/2010   FATTY LIVER DISEASE 03/06/2010   Hypothyroidism 10/25/2008   Hyperlipidemia 02/03/2007   GOUT 02/03/2007   Essential hypertension 02/03/2007   Social History   Tobacco Use   Smoking status: Former    Packs/day: 1.00    Years: 10.00    Total pack years: 10.00    Types: Cigarettes    Quit date: 08/13/1973    Years since quitting: 49.0   Smokeless tobacco: Former    Types: Snuff, Chew    Quit date: 05/12/1974  Substance Use Topics   Alcohol use: Yes    Alcohol/week: 0.0 standard drinks of alcohol    Comment: OCCASIONALLY    Current Outpatient Medications:    doxycycline (VIBRA-TABS) 100 MG tablet, Take 1 tablet (100 mg total) by mouth 2 (two) times daily., Disp: 20 tablet, Rfl: 0   guaiFENesin-codeine (ROBITUSSIN AC) 100-10 MG/5ML syrup, Take 10 mLs by mouth 3 (three) times daily as needed for cough., Disp: 120 mL, Rfl: 0   acetaminophen (TYLENOL) 650 MG CR tablet, Take 650 mg by mouth every 8 (eight) hours as needed for pain., Disp: , Rfl:    amLODipine (NORVASC) 10 MG tablet, TAKE 1 TABLET BY MOUTH DAILY, Disp: 30  tablet, Rfl: 11   apixaban (ELIQUIS) 5 MG TABS tablet, TAKE 1 TABLET BY MOUTH TWICE  DAILY, Disp: 200 tablet, Rfl: 1   atorvastatin (LIPITOR) 40 MG tablet, TAKE 1 TABLET BY MOUTH DAILY, Disp: 100 tablet, Rfl: 2   azelastine (ASTELIN) 0.1 % nasal spray, Place 1 spray into both nostrils 2 (two) times daily. Use in each nostril as directed, Disp: 30 mL, Rfl: 12   carvedilol (COREG) 3.125 MG tablet, TAKE 1 TABLET BY MOUTH TWICE  DAILY, Disp: 60 tablet, Rfl: 11   carvedilol (COREG) 3.125 MG tablet, Take 1 tablet (3.125 mg total) by mouth 2 (two) times daily., Disp: 60 tablet, Rfl: 0   cetirizine (ZYRTEC) 10 MG tablet, Take 10 mg by mouth as needed for allergies., Disp: , Rfl:    colchicine 0.6 MG tablet, Take 1 tablet (0.6 mg total) by mouth 2 (two) times daily as needed (pain). Reported on 10/03/2015, Disp: 30 tablet, Rfl: 3   dextromethorphan-guaiFENesin (MUCINEX DM) 30-600 MG 12hr tablet, Take 1 tablet by mouth 2 (two) times daily., Disp: 20 tablet, Rfl: 0   diclofenac Sodium (VOLTAREN) 1 % GEL, Apply 4 g topically 4 (four) times daily., Disp: , Rfl:    ezetimibe (ZETIA) 10 MG tablet, TAKE 1 TABLET BY MOUTH DAILY, Disp: 30 tablet, Rfl: 11   flecainide (TAMBOCOR) 50 MG tablet, TAKE 1 AND 1/2 TABLETS BY MOUTH  TWICE DAILY,  Disp: 270 tablet, Rfl: 3   furosemide (LASIX) 20 MG tablet, TAKE 1 TABLET BY MOUTH DAILY, Disp: 90 tablet, Rfl: 3   gabapentin (NEURONTIN) 300 MG capsule, TAKE 1 CAPSULE AT BEDTIME, Disp: 90 capsule, Rfl: 1   hydrALAZINE (APRESOLINE) 10 MG tablet, TAKE 1 TABLET BY MOUTH  TWICE DAILY, Disp: 200 tablet, Rfl: 1   ibuprofen (ADVIL) 200 MG tablet, Take 200 mg by mouth every 6 (six) hours as needed., Disp: , Rfl:    levothyroxine (SYNTHROID) 75 MCG tablet, TAKE 1 TABLET BY MOUTH DAILY, Disp: 100 tablet, Rfl: 2   omeprazole (PRILOSEC) 20 MG capsule, Take 20 mg by mouth daily as needed (acid reflux)., Disp: , Rfl:    Thiamine HCl (VITAMIN B-1) 250 MG tablet, Take 250 mg by mouth daily., Disp:  , Rfl:    TURMERIC CURCUMIN PO, Take 1,000 mg by mouth daily., Disp: , Rfl:    valsartan-hydrochlorothiazide (DIOVAN-HCT) 320-12.5 MG tablet, TAKE 1 TABLET BY MOUTH DAILY, Disp: 100 tablet, Rfl: 2    Assessment and Plan:   Diagnosis: Bronchitis. Please see myChart communication and orders below.   No orders of the defined types were placed in this encounter.  Meds ordered this encounter  Medications   doxycycline (VIBRA-TABS) 100 MG tablet    Sig: Take 1 tablet (100 mg total) by mouth 2 (two) times daily.    Dispense:  20 tablet    Refill:  0   guaiFENesin-codeine (ROBITUSSIN AC) 100-10 MG/5ML syrup    Sig: Take 10 mLs by mouth 3 (three) times daily as needed for cough.    Dispense:  120 mL    Refill:  0    Annye Asa, MD 08/03/2022  A total of 7 minutes were spent by me to personally review the patient-generated inquiry, review patient records and data pertinent to assessment of the patient's problem, develop a management plan including generation of prescriptions and/or orders, and on subsequent communication with the patient through secure the MyChart portal service.   There is no separately reported E/M service related to this service in the past 7 days nor does the patient have an upcoming soonest available appointment for this issue. This work was completed in less than 7 days.   The patient consented to this service today (see patient agreement prior to ongoing communication). Patient counseled regarding the need for in-person exam for certain conditions and was advised to call the office if any changing or worsening symptoms occur.   The codes to be used for the E/M service are: '[x]'$   99421 for 5-10 minutes of time spent on the inquiry. '[]'$   [id number] for 11-20 minutes. '[]'$   X700321 for 21+ minutes.

## 2022-08-29 ENCOUNTER — Other Ambulatory Visit: Payer: Self-pay | Admitting: Family Medicine

## 2022-11-06 ENCOUNTER — Encounter: Payer: Self-pay | Admitting: Family Medicine

## 2022-11-06 ENCOUNTER — Telehealth: Payer: Self-pay | Admitting: Family Medicine

## 2022-11-06 NOTE — Telephone Encounter (Signed)
Spoke to the pt and advised that was the only day I can get him in with Dr Birdie Riddle . He states he has not had blood work since 2022 and he is having hot flashes . I advised he will need an apt to discuss the issue he chose to keep the May 2 apt

## 2022-11-06 NOTE — Telephone Encounter (Signed)
Caller name: LETICIA OSTHOFF  On [place]?: Yes  Call back number: [phone] (home)  Provider they see: [name], MD  Reason for call:Pt would like a call from [place].

## 2022-11-13 ENCOUNTER — Other Ambulatory Visit: Payer: Self-pay | Admitting: Cardiology

## 2022-11-13 NOTE — Telephone Encounter (Signed)
Pt last saw Dr Curt Bears 03/05/22, last labs 10/30/21 Creat 1.25, pt is overdue for labwork.  Pt is scheduled for physical with Dr Birdie Riddle on 12/13/22 with labwork.  Age 77, weight 99.4kg, based on specified criteria pt is on appropriate dosage of Eliquis 5mg  BID for afib.  Will refill rx to get pt to upcoming CPX for repeat labwork.

## 2022-12-13 ENCOUNTER — Ambulatory Visit (INDEPENDENT_AMBULATORY_CARE_PROVIDER_SITE_OTHER): Payer: Medicare Other | Admitting: Family Medicine

## 2022-12-13 ENCOUNTER — Encounter: Payer: Self-pay | Admitting: Family Medicine

## 2022-12-13 VITALS — BP 128/68 | HR 58 | Temp 97.6°F | Ht 70.75 in | Wt 219.4 lb

## 2022-12-13 DIAGNOSIS — Z0000 Encounter for general adult medical examination without abnormal findings: Secondary | ICD-10-CM | POA: Diagnosis not present

## 2022-12-13 DIAGNOSIS — Z125 Encounter for screening for malignant neoplasm of prostate: Secondary | ICD-10-CM | POA: Diagnosis not present

## 2022-12-13 DIAGNOSIS — I10 Essential (primary) hypertension: Secondary | ICD-10-CM | POA: Diagnosis not present

## 2022-12-13 LAB — BASIC METABOLIC PANEL
BUN: 18 mg/dL (ref 6–23)
CO2: 29 mEq/L (ref 19–32)
Calcium: 9.2 mg/dL (ref 8.4–10.5)
Chloride: 99 mEq/L (ref 96–112)
Creatinine, Ser: 1.17 mg/dL (ref 0.40–1.50)
GFR: 59.69 mL/min — ABNORMAL LOW (ref >60.00)
Glucose, Bld: 101 mg/dL — ABNORMAL HIGH (ref 70–99)
Potassium: 3.9 mEq/L (ref 3.5–5.1)
Sodium: 139 mEq/L (ref 135–145)

## 2022-12-13 LAB — CBC WITH DIFFERENTIAL/PLATELET
Basophils Absolute: 0.1 10*3/uL (ref 0.0–0.1)
Basophils Relative: 0.7 % (ref 0.0–3.0)
Eosinophils Absolute: 0.1 10*3/uL (ref 0.0–0.7)
Eosinophils Relative: 1.6 % (ref 0.0–5.0)
HCT: 48.2 % (ref 39.0–52.0)
Hemoglobin: 16.3 g/dL (ref 13.0–17.0)
Lymphocytes Relative: 22.2 % (ref 12.0–46.0)
Lymphs Abs: 1.6 10*3/uL (ref 0.7–4.0)
MCHC: 33.9 g/dL (ref 30.0–36.0)
MCV: 89.8 fl (ref 78.0–100.0)
Monocytes Absolute: 0.8 10*3/uL (ref 0.1–1.0)
Monocytes Relative: 10.6 % (ref 3.0–12.0)
Neutro Abs: 4.8 10*3/uL (ref 1.4–7.7)
Neutrophils Relative %: 64.9 % (ref 43.0–77.0)
Platelets: 395 10*3/uL (ref 150.0–400.0)
RBC: 5.36 Mil/uL (ref 4.22–5.81)
RDW: 13.6 % (ref 11.5–15.5)
WBC: 7.3 10*3/uL (ref 4.0–10.5)

## 2022-12-13 LAB — LIPID PANEL
Cholesterol: 111 mg/dL (ref 0–200)
HDL: 37.8 mg/dL — ABNORMAL LOW (ref >39.00)
LDL Cholesterol: 56 mg/dL (ref 0–99)
NonHDL: 73.35
Total CHOL/HDL Ratio: 3
Triglycerides: 86 mg/dL (ref 0.0–149.0)
VLDL: 17.2 mg/dL (ref 0.0–40.0)

## 2022-12-13 LAB — HEPATIC FUNCTION PANEL
ALT: 20 U/L (ref 0–53)
AST: 21 U/L (ref 0–37)
Albumin: 4.2 g/dL (ref 3.5–5.2)
Alkaline Phosphatase: 64 U/L (ref 39–117)
Bilirubin, Direct: 0.3 mg/dL (ref 0.0–0.3)
Total Bilirubin: 1.1 mg/dL (ref 0.2–1.2)
Total Protein: 7.1 g/dL (ref 6.0–8.3)

## 2022-12-13 LAB — TSH: TSH: 2.38 u[IU]/mL (ref 0.35–5.50)

## 2022-12-13 LAB — PSA, MEDICARE: PSA: 2.68 ng/ml (ref 0.10–4.00)

## 2022-12-13 NOTE — Assessment & Plan Note (Signed)
Pt's PE WNL w/ exception of BMI.  Reg S1/S2 today.  UTD on PNA.  Check labs.  Anticipatory guidance provided.

## 2022-12-13 NOTE — Progress Notes (Signed)
   Subjective:    Patient ID: Stephen Morrison, male    DOB: [birth year], 77 y.o.   MRN: [id number]  HPI CPE- UTD on Tdap, PNA, no longer doing colonoscopy  Patient Care Team    Relationship Specialty Notifications Start End  Sheliah Hatch, MD PCP - General Family Medicine  04/09/14    Comment: Lorina Rabon, Andree Coss, MD PCP - Cardiology Cardiology Admissions 11/23/15    Comment: Electrophysiologist  Barron Alvine, MD (Inactive) Consulting Physician Urology  05/17/14   [facility]    02/08/20   Dahlia Byes, [employer] (Inactive) Pharmacist Pharmacist  05/02/20    Comment: [phone]    Health Maintenance  Topic Date Due   Zoster Vaccines- Shingrix (1 of 2) Never done   COVID-19 Vaccine (6 - 2023-24 season) 04/13/2022   Medicare Annual Wellness (AWV)  03/02/2023   INFLUENZA VACCINE  03/14/2023   DTaP/Tdap/Td (3 - Td or Tdap) 10/24/2031   Pneumonia Vaccine 76+ Years old  Completed   Hepatitis C Screening  Completed   HPV VACCINES  Aged Out   COLONOSCOPY (Pts 45-69yrs Insurance coverage will need to be confirmed)  Discontinued      Review of Systems Patient reports no vision/hearing changes, anorexia, fever ,adenopathy, persistant/recurrent hoarseness, swallowing issues, chest pain, palpitations, edema, persistant/recurrent cough, hemoptysis, dyspnea (rest,exertional, paroxysmal nocturnal), gastrointestinal  bleeding (melena, rectal bleeding), abdominal pain, excessive heart burn, GU symptoms (dysuria, hematuria, voiding/incontinence issues) syncope, focal weakness, memory loss, numbness & tingling, skin/hair/nail changes, depression, anxiety, abnormal bruising/bleeding, musculoskeletal symptoms/signs.     Objective:   Physical Exam General Appearance:    Alert, cooperative, no distress, appears stated age  Head:    Normocephalic, without obvious abnormality, atraumatic  Eyes:    PERRL, conjunctiva/corneas clear, EOM's intact both eyes       Ears:     Normal TM's and external ear canals, both ears  Nose:   Nares normal, septum midline, mucosa normal, no drainage   or sinus tenderness  Throat:   Lips, mucosa, and tongue normal; teeth and gums normal  Neck:   Supple, symmetrical, trachea midline, no adenopathy;       thyroid:  No enlargement/tenderness/nodules  Back:     Symmetric, no curvature, ROM normal, no CVA tenderness  Lungs:     Clear to auscultation bilaterally, respirations unlabored  Chest wall:    No tenderness or deformity  Heart:    Regular rate and rhythm, S1 and S2 normal, no murmur, rub   or gallop  Abdomen:     Soft, non-tender, bowel sounds active all four quadrants,    no masses, no organomegaly  Genitalia:    deferred  Rectal:    Extremities:   Extremities normal, atraumatic, no cyanosis or edema  Pulses:   2+ and symmetric all extremities  Skin:   Skin color, texture, turgor normal, no rashes or lesions  Lymph nodes:   Cervical, supraclavicular, and axillary nodes normal  Neurologic:   CNII-XII intact. Normal strength, sensation and reflexes      throughout          Assessment & Plan:

## 2022-12-13 NOTE — Assessment & Plan Note (Signed)
Chronic problem.  Currently well controlled and asymptomatic.  Check labs.  No anticipated med changes.  Will follow. 

## 2022-12-13 NOTE — Patient Instructions (Addendum)
Follow up in 6 months to recheck blood pressure and cholesterol We'll notify you of your lab results and make any changes if needed Keep up the good work on healthy diet and regular exercise- you look great!!! Call with any questions or concerns Stay Safe!  Stay Healthy! [place]!!!

## 2022-12-14 ENCOUNTER — Telehealth: Payer: Self-pay

## 2022-12-14 NOTE — Telephone Encounter (Signed)
-----   Message from Sheliah Hatch, MD sent at 12/14/2022  7:41 AM EDT ----- Labs look great!  No changes at this time

## 2022-12-14 NOTE — Telephone Encounter (Signed)
Pt aware of lab results 

## 2022-12-18 ENCOUNTER — Other Ambulatory Visit: Payer: Self-pay | Admitting: Cardiology

## 2023-01-12 ENCOUNTER — Other Ambulatory Visit: Payer: Self-pay | Admitting: Family Medicine

## 2023-01-21 DIAGNOSIS — I872 Venous insufficiency (chronic) (peripheral): Secondary | ICD-10-CM | POA: Diagnosis not present

## 2023-01-21 DIAGNOSIS — L812 Freckles: Secondary | ICD-10-CM | POA: Diagnosis not present

## 2023-01-21 DIAGNOSIS — L570 Actinic keratosis: Secondary | ICD-10-CM | POA: Diagnosis not present

## 2023-01-21 DIAGNOSIS — L308 Other specified dermatitis: Secondary | ICD-10-CM | POA: Diagnosis not present

## 2023-01-21 DIAGNOSIS — L821 Other seborrheic keratosis: Secondary | ICD-10-CM | POA: Diagnosis not present

## 2023-01-21 DIAGNOSIS — D1801 Hemangioma of skin and subcutaneous tissue: Secondary | ICD-10-CM | POA: Diagnosis not present

## 2023-01-23 ENCOUNTER — Ambulatory Visit (INDEPENDENT_AMBULATORY_CARE_PROVIDER_SITE_OTHER): Payer: Medicare Other | Admitting: *Deleted

## 2023-01-23 DIAGNOSIS — Z0000 Encounter for general adult medical examination without abnormal findings: Secondary | ICD-10-CM | POA: Diagnosis not present

## 2023-01-23 NOTE — Progress Notes (Signed)
Subjective:   Stephen Morrison is a 77 y.o. male who presents for Medicare Annual/Subsequent preventive examination.  I connected with  Stephen Morrison on 01/23/23 by a telephone enabled telemedicine application and verified that I am speaking with the correct person using two identifiers.   I discussed the limitations of evaluation and management by telemedicine. The patient expressed understanding and agreed to proceed.  Patient location: home  Provider location: telephone/home    Review of Systems     Cardiac Risk Factors include: advanced age (>68men, >68 women);hypertension;male gender;obesity (BMI >30kg/m2);smoking/ tobacco exposure     Objective:    Today's Vitals   There is no height or weight on file to calculate BMI.     01/23/2023    9:37 AM 03/01/2022    3:33 PM 02/08/2020    9:06 AM 07/03/2017    9:27 AM 06/13/2017    9:58 AM 11/05/2015    2:59 AM 05/17/2014    7:53 AM  Advanced Directives  Does Patient Have a Medical Advance Directive? Yes Yes Yes Yes Yes Yes Yes  Type of Estate agent of [employer] Power of [place];Living will Healthcare Power of [place];Living will Healthcare Power of [place];Living will Healthcare Power of [place] Living will Healthcare Power of [place];Living will  Copy of Healthcare Power of Attorney in Chart? No - copy requested No - copy requested No - copy requested No - copy requested       Current Medications (verified) Outpatient Encounter Medications as of 01/23/2023  Medication Sig   acetaminophen (TYLENOL) 650 MG CR tablet Take 650 mg by mouth every 8 (eight) hours as needed for pain.   amLODipine (NORVASC) 10 MG tablet TAKE 1 TABLET BY MOUTH DAILY   atorvastatin (LIPITOR) 40 MG tablet TAKE 1 TABLET BY MOUTH DAILY   carvedilol (COREG) 3.125 MG tablet TAKE 1 TABLET BY MOUTH TWICE  DAILY   cetirizine (ZYRTEC) 10 MG tablet Take 10 mg by mouth as needed for allergies.   diclofenac Sodium (VOLTAREN) 1 % GEL  Apply 4 g topically 4 (four) times daily.   ezetimibe (ZETIA) 10 MG tablet TAKE 1 TABLET BY MOUTH DAILY   flecainide (TAMBOCOR) 50 MG tablet TAKE 1 AND 1/2 TABLETS BY MOUTH  TWICE DAILY   furosemide (LASIX) 20 MG tablet TAKE 1 TABLET BY MOUTH DAILY   gabapentin (NEURONTIN) 300 MG capsule TAKE 1 CAPSULE AT BEDTIME   hydrALAZINE (APRESOLINE) 10 MG tablet TAKE 1 TABLET BY MOUTH TWICE  DAILY   ibuprofen (ADVIL) 200 MG tablet Take 200 mg by mouth every 6 (six) hours as needed.   levothyroxine (SYNTHROID) 75 MCG tablet TAKE 1 TABLET BY MOUTH DAILY   omeprazole (PRILOSEC) 20 MG capsule Take 20 mg by mouth daily as needed (acid reflux).   TURMERIC CURCUMIN PO Take 1,000 mg by mouth daily.   valsartan-hydrochlorothiazide (DIOVAN-HCT) 320-12.5 MG tablet TAKE 1 TABLET BY MOUTH DAILY   No facility-administered encounter medications on file as of 01/23/2023.    Allergies (verified) Amoxicillin   History: Past Medical History:  Diagnosis Date   Allergy    Arthritis    Atrial fibrillation (HCC)    one time incident, on medication now with no further problems   BPH (benign prostatic hyperplasia)    Diverticulosis of colon    GERD (gastroesophageal reflux disease)    Gout    STABLE PER PT ON 05-12-2014   Hyperlipidemia    Hypertension    Hypothyroidism    PSA  elevation    Tick bite 02/25/2016   Urinary retention    Past Surgical History:  Procedure Laterality Date   COLONOSCOPY  01-31-2007   EXCISION BENIGN CYST POSTERIOR NECK  2010   INSERTION OF SUPRAPUBIC CATHETER N/A 05/17/2014   Procedure: INSERTION OF SUPRAPUBIC CATHETER;  Surgeon: Valetta Fuller, MD;  Location: [facility];  Service: Urology;  Laterality: N/A;   ORIF RIGHT WRIST FX  2009   TRANSURETHRAL RESECTION OF PROSTATE N/A 05/17/2014   Procedure: TRANSURETHRAL RESECTION OF THE PROSTATE WITH GYRUS INSTRUMENTS;  Surgeon: Valetta Fuller, MD;  Location: [facility];  Service: Urology;  Laterality: N/A;    VASECTOMY     Family History  Problem Relation Age of Onset   Alcohol abuse Father    Dementia Father    Esophageal varices Father    Hyperlipidemia Mother    Hypertension Mother    Colon cancer Neg Hx    Esophageal cancer Neg Hx    Stomach cancer Neg Hx    Rectal cancer Neg Hx    Social History   Socioeconomic History   Marital status: Married    Spouse name: Not on file   Number of children: Not on file   Years of education: Not on file   Highest education level: Not on file  Occupational History   Occupation: retired  Tobacco Use   Smoking status: Former    Packs/day: 1.00    Years: 10.00    Additional pack years: 0.00    Total pack years: 10.00    Types: Cigarettes    Quit date: 08/13/1973    Years since quitting: 49.4   Smokeless tobacco: Former    Types: Snuff, Chew    Quit date: 05/12/1974  Vaping Use   Vaping Use: Never used  Substance and Sexual Activity   Alcohol use: Yes    Alcohol/week: 0.0 standard drinks of alcohol    Comment: OCCASIONALLY   Drug use: No   Sexual activity: Not Currently  Other Topics Concern   Not on file  Social History Narrative   Not on file   Social Determinants of Health   Financial Resource Strain: Low Risk  (01/23/2023)   Overall Financial Resource Strain (CARDIA)    Difficulty of Paying Living Expenses: Not hard at all  Food Insecurity: No Food Insecurity (01/23/2023)   Hunger Vital Sign    Worried About Running Out of Food in the Last Year: Never true    Ran Out of Food in the Last Year: Never true  Transportation Needs: No Transportation Needs (01/23/2023)   PRAPARE - Administrator, Civil Service (Medical): No    Lack of Transportation (Non-Medical): No  Physical Activity: Inactive (01/23/2023)   Exercise Vital Sign    Days of Exercise per Week: 0 days    Minutes of Exercise per Session: 0 min  Stress: No Stress Concern Present (01/23/2023)   [employer] of Occupational Health - Occupational  Stress Questionnaire    Feeling of Stress : Not at all  Social Connections: Moderately Integrated (01/23/2023)   Social Connection and Isolation Panel [NHANES]    Frequency of Communication with Friends and Family: More than three times a week    Frequency of Social Gatherings with Friends and Family: Never    Attends Religious Services: More than 4 times per year    Active Member of [employer] or Organizations: No    Attends Banker Meetings: Never  Marital Status: Married    Tobacco Counseling Counseling given: Not Answered   Clinical Intake:  Pre-visit preparation completed: Yes  Pain : No/denies pain     Nutritional Risks: None Diabetes: No  How often do you need to have someone help you when you read instructions, pamphlets, or other written materials from your doctor or pharmacy?: 1 - Never  Diabetic?  no  Interpreter Needed?: No  Information entered by :: Remi Haggard LPN   Activities of Daily Living    01/23/2023    9:37 AM 01/22/2023   11:20 AM  In your present state of health, do you have any difficulty performing the following activities:  Hearing? 1 1  Vision? 0 0  Difficulty concentrating or making decisions? 0 0  Walking or climbing stairs? 0 0  Dressing or bathing? 0 0  Doing errands, shopping? 0 0  Preparing Food and eating ? N N  Using the Toilet? N N  In the past six months, have you accidently leaked urine? N N  Do you have problems with loss of bowel control? N N  Managing your Medications? N N  Managing your Finances? N N  Housekeeping or managing your Housekeeping? N N    Patient Care Team: Sheliah Hatch, MD as PCP - General (Family Medicine) Regan Lemming, MD as PCP - Cardiology (Cardiology) Barron Alvine, MD (Inactive) as Consulting Physician (Urology) [facility] Dahlia Byes, [facility] (Inactive) as Pharmacist (Pharmacist)  Indicate any recent Medical Services you may have received from  other than [facility] providers in the past year (date may be approximate).     Assessment:   This is a routine wellness examination for G[place].  Hearing/Vision screen Hearing Screening - Comments:: Bilateral hearing aids Vision Screening - Comments:: Not up to date   Dietary issues and exercise activities discussed: Current Exercise Habits: The patient does not participate in regular exercise at present   Goals Addressed             This Visit's Progress    Patient Stated       Continue current lifestyles       Depression Screen    01/23/2023    9:42 AM 12/13/2022    9:12 AM 03/01/2022    3:34 PM 03/01/2022    3:32 PM 06/02/2021    8:50 AM 02/27/2021   10:56 AM 09/05/2020    7:36 AM  PHQ 2/9 Scores  PHQ - 2 Score 0 0 0 0 0 0 0  PHQ- 9 Score 1 1   0  0    Fall Risk    01/23/2023    9:40 AM 01/22/2023   11:20 AM 12/13/2022    9:12 AM 03/01/2022    3:34 PM 03/01/2022    2:41 PM  Fall Risk   Falls in the past year? 0 0 0 0 0  Number falls in past yr: 0  0 0   Injury with Fall? 0  0 0   Risk for fall due to :   No Fall Risks    Follow up Falls evaluation completed;Education provided;Falls prevention discussed  Falls evaluation completed Falls evaluation completed;Education provided     FALL RISK PREVENTION PERTAINING TO THE HOME:  Any stairs in or around the home? No  If so, are there any without handrails? No  Home free of loose throw rugs in walkways, pet beds, electrical cords, etc? Yes  Adequate lighting in your home to reduce risk of  falls? Yes   ASSISTIVE DEVICES UTILIZED TO PREVENT FALLS:  Life alert? No  Use of a cane, walker or w/c? No  Grab bars in the bathroom? Yes  Shower chair or bench in shower? Yes  Elevated toilet seat or a handicapped toilet? Yes   TIMED UP AND Morrison:  Was the test performed? No .    Cognitive Function:    07/03/2017    9:28 AM  MMSE - Mini Mental State Exam  Orientation to time 5  Orientation to Place 5  Registration 3   Attention/ Calculation 0  Recall 3  Language- name 2 objects 2  Language- repeat 1  Language- follow 3 step command 3  Language- read & follow direction 1  Write a sentence 1  Copy design 1  Total score 25        01/23/2023    9:39 AM 02/08/2020    9:15 AM  6CIT Screen  What Year? 0 points 0 points  What month? 0 points 0 points  What time? 0 points 0 points  Count back from 20 0 points 0 points  Months in reverse 0 points 0 points  Repeat phrase 0 points 0 points  Total Score 0 points 0 points    Immunizations Immunization History  Administered Date(s) Administered   Influenza Split 06/27/2011, 07/02/2012   Influenza Whole 09/24/2007, 05/03/2008, 09/28/2009   Influenza, High Dose Seasonal PF 06/01/2013, 04/20/2020, 05/29/2021   Influenza,inj,Quad PF,6+ Mos 06/30/2014, 05/18/2015, 07/02/2016, 06/17/2017   Influenza-Unspecified 06/09/2018, 05/22/2021   PFIZER(Purple Top)SARS-COV-2 Vaccination 09/03/2019, 09/25/2019, 07/19/2020, 05/24/2021   Pfizer Covid-19 Vaccine Bivalent Booster 35yrs & up 05/31/2021   Pneumococcal Conjugate-13 03/31/2015   Pneumococcal Polysaccharide-23 10/25/2008, 07/02/2016   Td 09/24/2007   Tdap 10/23/2021   Zoster Recombinat (Shingrix) 01/14/2023   Zoster, Live 05/14/2012    TDAP status: Up to date  Flu Vaccine status: Up to date  Pneumococcal vaccine status: Up to date  Covid-19 vaccine status: Information provided on how to obtain vaccines.   Qualifies for Shingles Vaccine? Yes    Zostavax completed Yes   Shingrix Completed?: No.    Education has been provided regarding the importance of this vaccine. Patient has been advised to call insurance company to determine out of pocket expense if they have not yet received this vaccine. Advised may also receive vaccine at local pharmacy or Health Dept. Verbalized acceptance and understanding.  Screening Tests Health Maintenance  Topic Date Due   COVID-19 Vaccine (6 - 2023-24 season)  04/13/2022   Zoster Vaccines- Shingrix (2 of 2) 03/11/2023   INFLUENZA VACCINE  03/14/2023   Medicare Annual Wellness (AWV)  01/23/2024   DTaP/Tdap/Td (3 - Td or Tdap) 10/24/2031   Pneumonia Vaccine 4+ Years old  Completed   Hepatitis C Screening  Completed   HPV VACCINES  Aged Out   Colonoscopy  Discontinued    Health Maintenance  Health Maintenance Due  Topic Date Due   COVID-19 Vaccine (6 - 2023-24 season) 04/13/2022    Colorectal cancer screening: No longer required.   Lung Cancer Screening: (Low Dose CT Chest recommended if Age 74-80 years, 30 pack-year currently smoking OR have quit w/in 15years.) does not qualify.   Lung Cancer Screening Referral:   Additional Screening:  Hepatitis C Screening: does not qualify; Completed 2017  Vision Screening: Recommended annual ophthalmology exams for early detection of glaucoma and other disorders of the eye. Is the patient up to date with their annual eye exam?  Yes  Who is  the provider or what is the name of the office in which the patient attends annual eye exams?  If pt is not established with a provider, would they like to be referred to a provider to establish care? No .   Dental Screening: Recommended annual dental exams for proper oral hygiene  Community Resource Referral / Chronic Care Management: CRR required this visit?  No   CCM required this visit?  No      Plan:     I have personally reviewed and noted the following in the patient's chart:   Medical and social history Use of alcohol, tobacco or illicit drugs  Current medications and supplements including opioid prescriptions. Patient is not currently taking opioid prescriptions. Functional ability and status Nutritional status Physical activity Advanced directives List of other physicians Hospitalizations, surgeries, and ER visits in previous 12 months Vitals Screenings to include cognitive, depression, and falls Referrals and appointments  In  addition, I have reviewed and discussed with patient certain preventive protocols, quality metrics, and best practice recommendations. A written personalized care plan for preventive services as well as general preventive health recommendations were provided to patient.     Remi Haggard, LPN   [id number]/61/0960   Nurse Notes:

## 2023-01-23 NOTE — Patient Instructions (Signed)
[name] , Thank you for taking time to come for your Medicare Wellness Visit. I appreciate your ongoing commitment to your health goals. Please review the following plan we discussed and let me know if I can assist you in the future.   Screening recommendations/referrals: Colonoscopy: up to date Recommended yearly ophthalmology/optometry visit for glaucoma screening and checkup Recommended yearly dental visit for hygiene and checkup  Vaccinations: Influenza vaccine: up to date Pneumococcal vaccine: up to date Tdap vaccine: up to date Shingles vaccine: 1 of 2     Advanced directives: Education provided      Preventive Care 65 Years and Older, Male Preventive care refers to lifestyle choices and visits with your health care provider that can promote health and wellness. What does preventive care include? A yearly physical exam. This is also called an annual well check. Dental exams once or twice a year. Routine eye exams. Ask your health care provider how often you should have your eyes checked. Personal lifestyle choices, including: Daily care of your teeth and gums. Regular physical activity. Eating a healthy diet. Avoiding tobacco and drug use. Limiting alcohol use. Practicing safe sex. Taking low doses of aspirin every day. Taking vitamin and mineral supplements as recommended by your health care provider. What happens during an annual well check? The services and screenings done by your health care provider during your annual well check will depend on your age, overall health, lifestyle risk factors, and family history of disease. Counseling  Your health care provider may ask you questions about your: Alcohol use. Tobacco use. Drug use. Emotional well-being. Home and relationship well-being. Sexual activity. Eating habits. History of falls. Memory and ability to understand (cognition). Work and work Astronomer. Screening  You may have the following tests or  measurements: Height, weight, and BMI. Blood pressure. Lipid and cholesterol levels. These may be checked every 5 years, or more frequently if you are over 85 years old. Skin check. Lung cancer screening. You may have this screening every year starting at age 29 if you have a 30-pack-year history of smoking and currently smoke or have quit within the past 15 years. Fecal occult blood test (FOBT) of the stool. You may have this test every year starting at age 72. Flexible sigmoidoscopy or colonoscopy. You may have a sigmoidoscopy every 5 years or a colonoscopy every 10 years starting at age 67. Prostate cancer screening. Recommendations will vary depending on your family history and other risks. Hepatitis C blood test. Hepatitis B blood test. Sexually transmitted disease (STD) testing. Diabetes screening. This is done by checking your blood sugar (glucose) after you have not eaten for a while (fasting). You may have this done every 1-3 years. Abdominal aortic aneurysm (AAA) screening. You may need this if you are a current or former smoker. Osteoporosis. You may be screened starting at age 61 if you are at high risk. Talk with your health care provider about your test results, treatment options, and if necessary, the need for more tests. Vaccines  Your health care provider may recommend certain vaccines, such as: Influenza vaccine. This is recommended every year. Tetanus, diphtheria, and acellular pertussis (Tdap, Td) vaccine. You may need a Td booster every 10 years. Zoster vaccine. You may need this after age 85. Pneumococcal 13-valent conjugate (PCV13) vaccine. One dose is recommended after age 69. Pneumococcal polysaccharide (PPSV23) vaccine. One dose is recommended after age 81. Talk to your health care provider about which screenings and vaccines you need and how often  you need them. This information is not intended to replace advice given to you by your health care provider. Make sure  you discuss any questions you have with your health care provider. Document Released: 08/26/2015 Document Revised: 04/18/2016 Document Reviewed: 05/31/2015 Elsevier Interactive Patient Education  2017 [place] Prevention in the Home Falls can cause injuries. They can happen to people of all ages. There are many things you can do to make your home safe and to help prevent falls. What can I do on the outside of my home? Regularly fix the edges of walkways and driveways and fix any cracks. Remove anything that might make you trip as you walk through a door, such as a raised step or threshold. Trim any bushes or trees on the path to your home. Use bright outdoor lighting. Clear any walking paths of anything that might make someone trip, such as rocks or tools. Regularly check to see if handrails are loose or broken. Make sure that both sides of any steps have handrails. Any raised decks and porches should have guardrails on the edges. Have any leaves, snow, or ice cleared regularly. Use sand or salt on walking paths during winter. Clean up any spills in your garage right away. This includes oil or grease spills. What can I do in the bathroom? Use night lights. Install grab bars by the toilet and in the tub and shower. Do not use towel bars as grab bars. Use non-skid mats or decals in the tub or shower. If you need to sit down in the shower, use a plastic, non-slip stool. Keep the floor dry. Clean up any water that spills on the floor as soon as it happens. Remove soap buildup in the tub or shower regularly. Attach bath mats securely with double-sided non-slip rug tape. Do not have throw rugs and other things on the floor that can make you trip. What can I do in the bedroom? Use night lights. Make sure that you have a light by your bed that is easy to reach. Do not use any sheets or blankets that are too big for your bed. They should not hang down onto the floor. Have a firm  chair that has side arms. You can use this for support while you get dressed. Do not have throw rugs and other things on the floor that can make you trip. What can I do in the kitchen? Clean up any spills right away. Avoid walking on wet floors. Keep items that you use a lot in easy-to-reach places. If you need to reach something above you, use a strong step stool that has a grab bar. Keep electrical cords out of the way. Do not use floor polish or wax that makes floors slippery. If you must use wax, use non-skid floor wax. Do not have throw rugs and other things on the floor that can make you trip. What can I do with my stairs? Do not leave any items on the stairs. Make sure that there are handrails on both sides of the stairs and use them. Fix handrails that are broken or loose. Make sure that handrails are as long as the stairways. Check any carpeting to make sure that it is firmly attached to the stairs. Fix any carpet that is loose or worn. Avoid having throw rugs at the top or bottom of the stairs. If you do have throw rugs, attach them to the floor with carpet tape. Make sure that you have a light  switch at the top of the stairs and the bottom of the stairs. If you do not have them, ask someone to add them for you. What else can I do to help prevent falls? Wear shoes that: Do not have high heels. Have rubber bottoms. Are comfortable and fit you well. Are closed at the toe. Do not wear sandals. If you use a stepladder: Make sure that it is fully opened. Do not climb a closed stepladder. Make sure that both sides of the stepladder are locked into place. Ask someone to hold it for you, if possible. Clearly mark and make sure that you can see: Any grab bars or handrails. First and last steps. Where the edge of each step is. Use tools that help you move around (mobility aids) if they are needed. These include: Canes. Walkers. Scooters. Crutches. Turn on the lights when you go  into a dark area. Replace any light bulbs as soon as they burn out. Set up your furniture so you have a clear path. Avoid moving your furniture around. If any of your floors are uneven, fix them. If there are any pets around you, be aware of where they are. Review your medicines with your doctor. Some medicines can make you feel dizzy. This can increase your chance of falling. Ask your doctor what other things that you can do to help prevent falls. This information is not intended to replace advice given to you by your health care provider. Make sure you discuss any questions you have with your health care provider. Document Released: 05/26/2009 Document Revised: 01/05/2016 Document Reviewed: 09/03/2014 Elsevier Interactive Patient Education  2017 [employer].

## 2023-03-05 ENCOUNTER — Other Ambulatory Visit: Payer: Self-pay | Admitting: Family Medicine

## 2023-03-05 DIAGNOSIS — E038 Other specified hypothyroidism: Secondary | ICD-10-CM

## 2023-03-06 DIAGNOSIS — H2513 Age-related nuclear cataract, bilateral: Secondary | ICD-10-CM | POA: Diagnosis not present

## 2023-03-06 DIAGNOSIS — H40013 Open angle with borderline findings, low risk, bilateral: Secondary | ICD-10-CM | POA: Diagnosis not present

## 2023-03-22 ENCOUNTER — Other Ambulatory Visit: Payer: Self-pay | Admitting: Family Medicine

## 2023-03-22 ENCOUNTER — Other Ambulatory Visit: Payer: Self-pay | Admitting: Cardiology

## 2023-03-29 ENCOUNTER — Ambulatory Visit: Payer: Medicare Other | Attending: Physician Assistant | Admitting: Physician Assistant

## 2023-03-29 ENCOUNTER — Encounter: Payer: Self-pay | Admitting: Physician Assistant

## 2023-03-29 VITALS — BP 148/78 | HR 56 | Ht 70.0 in | Wt 221.0 lb

## 2023-03-29 DIAGNOSIS — I10 Essential (primary) hypertension: Secondary | ICD-10-CM

## 2023-03-29 DIAGNOSIS — D6869 Other thrombophilia: Secondary | ICD-10-CM | POA: Diagnosis not present

## 2023-03-29 DIAGNOSIS — I480 Paroxysmal atrial fibrillation: Secondary | ICD-10-CM

## 2023-03-29 MED ORDER — AMLODIPINE BESYLATE 10 MG PO TABS: 5.0000 mg | ORAL_TABLET | Freq: Every day | ORAL | 2 refills | Status: DC

## 2023-03-29 MED ORDER — APIXABAN 5 MG PO TABS: 5.0000 mg | ORAL_TABLET | Freq: Two times a day (BID) | ORAL | 2 refills | Status: DC

## 2023-03-29 NOTE — Progress Notes (Signed)
Cardiology Office Note Date:  03/29/2023  Patient ID:  Stephen Morrison [birth year], MRN [id number] PCP:  Sheliah Hatch, MD  Electrophysiologist: Dr. Elberta Fortis     Chief Complaint: 1 year  History of Present Illness: Stephen Morrison is a 77 y.o. male with history of HTN, HLD, hypothyroidism, AFib  He saw Dr. Elberta Fortis 03/05/22, doing well, happy with his AFib management. Stable intervals, no changes were made  TODAY He feels well His med list does not look like he is in Eliquis but he assures me he is, shows me his personal list that has eliquis 5mg  BID on it. He is certain he takes it because he used to take Xarelto and had hemorrhoidal bleeding but none with Eliquis since he has been on it. He generally wishes he had more energy, attributes that perhaps mostly to aging, not an acute change No CP, palpitations He says he would know if he had Afib, and since in his meds has not had further, also wonders if his meds contrinute to his fatigue, since 2017 when started feels like his energy was reduced. No near syncope or syncope, no dizzy spels Denies SOB Denies DOE or exertional intolerances   AF/AAD hx Diagnosed 2017 Flecainide started April 2017   Past Medical History:  Diagnosis Date   Allergy    Arthritis    Atrial fibrillation (HCC)    one time incident, on medication now with no further problems   BPH (benign prostatic hyperplasia)    Diverticulosis of colon    GERD (gastroesophageal reflux disease)    Gout    STABLE PER PT ON 05-12-2014   Hyperlipidemia    Hypertension    Hypothyroidism    PSA elevation    Tick bite 02/25/2016   Urinary retention     Past Surgical History:  Procedure Laterality Date   COLONOSCOPY  01-31-2007   EXCISION BENIGN CYST POSTERIOR NECK  2010   INSERTION OF SUPRAPUBIC CATHETER N/A 05/17/2014   Procedure: INSERTION OF SUPRAPUBIC CATHETER;  Surgeon: Valetta Fuller, MD;  Location: [facility];  Service: Urology;   Laterality: N/A;   ORIF RIGHT WRIST FX  2009   TRANSURETHRAL RESECTION OF PROSTATE N/A 05/17/2014   Procedure: TRANSURETHRAL RESECTION OF THE PROSTATE WITH GYRUS INSTRUMENTS;  Surgeon: Valetta Fuller, MD;  Location: [facility];  Service: Urology;  Laterality: N/A;   VASECTOMY      Current Outpatient Medications  Medication Sig Dispense Refill   acetaminophen (TYLENOL) 650 MG CR tablet Take 650 mg by mouth every 8 (eight) hours as needed for pain.     amLODipine (NORVASC) 10 MG tablet TAKE 1 TABLET BY MOUTH DAILY 100 tablet 2   atorvastatin (LIPITOR) 40 MG tablet TAKE 1 TABLET BY MOUTH DAILY 100 tablet 2   carvedilol (COREG) 3.125 MG tablet TAKE 1 TABLET BY MOUTH TWICE  DAILY 60 tablet 11   cetirizine (ZYRTEC) 10 MG tablet Take 10 mg by mouth as needed for allergies.     diclofenac Sodium (VOLTAREN) 1 % GEL Apply 4 g topically 4 (four) times daily.     ezetimibe (ZETIA) 10 MG tablet TAKE 1 TABLET BY MOUTH DAILY 100 tablet 2   flecainide (TAMBOCOR) 50 MG tablet TAKE 1 AND 1/2 TABLETS BY MOUTH  TWICE DAILY 300 tablet 0   furosemide (LASIX) 20 MG tablet TAKE 1 TABLET BY MOUTH DAILY 100 tablet 2   gabapentin (NEURONTIN) 300 MG capsule TAKE 1  CAPSULE AT BEDTIME 90 capsule 1   hydrALAZINE (APRESOLINE) 10 MG tablet TAKE 1 TABLET BY MOUTH TWICE  DAILY 200 tablet 2   ibuprofen (ADVIL) 200 MG tablet Take 200 mg by mouth every 6 (six) hours as needed.     levothyroxine (SYNTHROID) 75 MCG tablet TAKE 1 TABLET BY MOUTH DAILY 100 tablet 2   omeprazole (PRILOSEC) 20 MG capsule Take 20 mg by mouth daily as needed (acid reflux).     TURMERIC CURCUMIN PO Take 1,000 mg by mouth daily.     valsartan-hydrochlorothiazide (DIOVAN-HCT) 320-12.5 MG tablet TAKE 1 TABLET BY MOUTH DAILY 100 tablet 2   No current facility-administered medications for this visit.    Allergies:   Amoxicillin   Social History:  The patient  reports that he quit smoking about 49 years ago. His smoking use included  cigarettes. He started smoking about 59 years ago. He has a 10 pack-year smoking history. He quit smokeless tobacco use about 48 years ago.  His smokeless tobacco use included snuff and chew. He reports current alcohol use. He reports that he does not use drugs.   Family History:  The patient's family history includes Alcohol abuse in his father; Dementia in his father; Esophageal varices in his father; Hyperlipidemia in his mother; Hypertension in his mother.  ROS:  Please see the history of present illness.    All other systems are reviewed and otherwise negative.   PHYSICAL EXAM:  VS:  There were no vitals taken for this visit. BMI: There is no height or weight on file to calculate BMI. Well nourished, well developed, in no acute distress HEENT: normocephalic, atraumatic Neck: no JVD, carotid bruits or masses Cardiac:   RRR; no significant murmurs, no rubs, or gallops Lungs:  CTA b/l, no wheezing, rhonchi or rales Abd: soft, nontender MS: no deformity or atrophy Ext: a[ears swollen though only trace pitting edema b/l, he tells me "I have my mother's ankles), no changes   Skin: warm and dry, no rash Neuro:  No gross deficits appreciated Psych: euthymic mood, full affect    EKG:  Done today and reviewed by myself shows  SB 56bpm, 1st degree AVblock , QRS , QTc  TTE 12/09/15 - Left ventricle: The cavity size was normal. Wall thickness was   increased in a pattern of mild LVH. Systolic function was normal.   The estimated ejection fraction was in the range of 60% to 65%.   Wall motion was normal; there were no regional wall motion   abnormalities.  Recent Labs: 12/13/2022: ALT 20; BUN 18; Creatinine, Ser 1.17; Hemoglobin 16.3; Platelets 395.0; Potassium 3.9; Sodium 139; TSH 2.38  12/13/2022: Cholesterol 111; HDL 37.80; LDL Cholesterol 56; Total CHOL/HDL Ratio 3; Triglycerides 86.0; VLDL 17.2   CrCl cannot be calculated (Patient's most recent lab result is older than the  maximum 21 days allowed.).   Wt Readings from Last 3 Encounters:  12/13/22 219 lb 6.4 oz (99.5 kg)  03/05/22 219 lb 3.2 oz (99.4 kg)  01/02/21 216 lb (98 kg)     Other studies reviewed: Additional studies/records reviewed today include: summarized above  ASSESSMENT AND PLAN:  Paroxysmal AFib CHA2DS2Vasc is 3, on Eliquis, appropriately dosed No burden by symptoms Flecainide and coreg Intervals appear just slightly longer from the last couple EKGs  Will have him back in 4 mo Unclear why his Eliquis was taken off his list, ? Last adjustment says "pt preference".  As above he assures me he is taking  it and getting it from [place], [place] has sent in a Rx to be certain and added back to his list   HTN Recheck 130/80 He checks infrequently at home, but reports 120's/?   Secondary hypercoagulable state      Disposition: F/u with [place] in 51mo, sooner if needed  Current medicines are reviewed at length with the patient today.  The patient did not have any concerns regarding medicines.  Norma Fredrickson, PA-C 03/29/2023 4:17 AM     [facility] [address] [place] [address] [phone] (office)  [phone] (fax)

## 2023-03-29 NOTE — Patient Instructions (Signed)
Medication Instructions:   Your physician recommends that you continue on your current medications as directed. Please refer to the Current Medication list given to you today.   *If you need a refill on your cardiac medications before your next appointment, please call your pharmacy*   Lab Work:  NONE ORDERED  TODAY     If you have labs (blood work) drawn today and your tests are completely normal, you will receive your results only by: MyChart Message (if you have MyChart) OR A paper copy in the mail If you have any lab test that is abnormal or we need to change your treatment, we will call you to review the results.   Testing/Procedures: NONE ORDERED  TODAY     Follow-Up: At [facility], you and your health needs are our priority.  As part of our continuing mission to provide you with exceptional heart care, we have created designated Provider Care Teams.  These Care Teams include your primary Cardiologist (physician) and Advanced Practice Providers (APPs -  Physician Assistants and Nurse Practitioners) who all work together to provide you with the care you need, when you need it.  We recommend signing up for the patient portal called "MyChart".  Sign up information is provided on this After Visit Summary.  MyChart is used to connect with patients for Virtual Visits (Telemedicine).  Patients are able to view lab/test results, encounter notes, upcoming appointments, etc.  Non-urgent messages can be sent to your provider as well.   To learn more about what you can do with MyChart, go to [email or web address].    Your next appointment:   4 month(s)  Provider:   Renee Ursuy, PA-C      Other Instructions   

## 2023-04-14 ENCOUNTER — Other Ambulatory Visit: Payer: Self-pay | Admitting: Family Medicine

## 2023-04-16 ENCOUNTER — Encounter: Payer: Self-pay | Admitting: Family Medicine

## 2023-04-18 ENCOUNTER — Other Ambulatory Visit: Payer: Self-pay | Admitting: Cardiology

## 2023-04-18 ENCOUNTER — Other Ambulatory Visit: Payer: Self-pay | Admitting: Family Medicine

## 2023-04-18 DIAGNOSIS — E785 Hyperlipidemia, unspecified: Secondary | ICD-10-CM

## 2023-06-03 ENCOUNTER — Telehealth: Payer: Self-pay | Admitting: Family Medicine

## 2023-06-03 NOTE — Telephone Encounter (Signed)
Encourage patient to contact the pharmacy for refills or they can request refills through [facility]  (Please schedule appointment if patient has not been seen in over a year)    WHAT PHARMACY WOULD THEY LIKE THIS SENT TO: [id number] - [place], [place] - [address]   MEDICATION NAME & DOSE: colchicine 0.6 MG tablet   NOTES/COMMENTS FROM PATIENT: Pt stopped taking this medication about 5 years ago, but he states he is having a gout flare-up and was wondering if he could have this medication filled. I mentioned an appt might be needed before refill can be sent and he stated he is okay with that.      Front office please notify patient: It takes 48-72 hours to process rx refill requests Ask patient to call pharmacy to ensure rx is ready before heading there.

## 2023-06-04 NOTE — Telephone Encounter (Signed)
Sent to Dr.[name] Please advise

## 2023-06-07 ENCOUNTER — Other Ambulatory Visit: Payer: Self-pay | Admitting: Family Medicine

## 2023-06-07 MED ORDER — COLCHICINE 0.6 MG PO TABS: 0.6000 mg | ORAL_TABLET | Freq: Two times a day (BID) | ORAL | 3 refills | Status: AC | PRN

## 2023-06-07 NOTE — Progress Notes (Signed)
Prescription sent to pharmacy for Colchicine refill.  He should only take as needed for pain and not regularly

## 2023-06-07 NOTE — Telephone Encounter (Signed)
Pt has been informed, stated we took too long to respond and he took a medication from 7 years ago I tried to explain that we are allotted 48-72 hours to respond to these messages and I apologized it wasn't as fast as hed have liked but it has been sent in so he can refill this for next time and please do not take an expired medication in the future as this can be dangerous for his health and he said he wouldn't have had to do that if we had done our job I stated again we are doing the best we can and trying to care for everyone pt cut me off stated he would talk about this and me with Dr Beverely Low next time and then proceeded to hang up.

## 2023-06-07 NOTE — Telephone Encounter (Signed)
Refill sent to pharmacy.  He should only take as needed for pain and not regularly

## 2023-06-15 ENCOUNTER — Other Ambulatory Visit: Payer: Self-pay | Admitting: Family Medicine

## 2023-06-18 ENCOUNTER — Ambulatory Visit: Payer: Medicare Other | Admitting: Family Medicine

## 2023-06-19 ENCOUNTER — Encounter: Payer: Self-pay | Admitting: Family Medicine

## 2023-06-19 ENCOUNTER — Ambulatory Visit (INDEPENDENT_AMBULATORY_CARE_PROVIDER_SITE_OTHER): Payer: Medicare Other | Admitting: Family Medicine

## 2023-06-19 VITALS — BP 144/78 | HR 54 | Temp 98.2°F | Ht 70.0 in | Wt 215.5 lb

## 2023-06-19 DIAGNOSIS — E785 Hyperlipidemia, unspecified: Secondary | ICD-10-CM

## 2023-06-19 DIAGNOSIS — Z23 Encounter for immunization: Secondary | ICD-10-CM

## 2023-06-19 DIAGNOSIS — I10 Essential (primary) hypertension: Secondary | ICD-10-CM | POA: Diagnosis not present

## 2023-06-19 DIAGNOSIS — E038 Other specified hypothyroidism: Secondary | ICD-10-CM

## 2023-06-19 LAB — LIPID PANEL
Cholesterol: 106 mg/dL (ref 0–200)
HDL: 35.4 mg/dL — ABNORMAL LOW (ref >39.00)
LDL Cholesterol: 47 mg/dL (ref 0–99)
NonHDL: 70.74
Total CHOL/HDL Ratio: 3
Triglycerides: 117 mg/dL (ref 0.0–149.0)
VLDL: 23.4 mg/dL (ref 0.0–40.0)

## 2023-06-19 LAB — HEPATIC FUNCTION PANEL
ALT: 33 U/L (ref 0–53)
AST: 27 U/L (ref 0–37)
Albumin: 4.2 g/dL (ref 3.5–5.2)
Alkaline Phosphatase: 59 U/L (ref 39–117)
Bilirubin, Direct: 0.2 mg/dL (ref 0.0–0.3)
Total Bilirubin: 1.1 mg/dL (ref 0.2–1.2)
Total Protein: 6.9 g/dL (ref 6.0–8.3)

## 2023-06-19 LAB — CBC WITH DIFFERENTIAL/PLATELET
Basophils Absolute: 0 10*3/uL (ref 0.0–0.1)
Basophils Relative: 0.5 % (ref 0.0–3.0)
Eosinophils Absolute: 0.1 10*3/uL (ref 0.0–0.7)
Eosinophils Relative: 2.3 % (ref 0.0–5.0)
HCT: 47.9 % (ref 39.0–52.0)
Hemoglobin: 16 g/dL (ref 13.0–17.0)
Lymphocytes Relative: 30.2 % (ref 12.0–46.0)
Lymphs Abs: 1.9 10*3/uL (ref 0.7–4.0)
MCHC: 33.4 g/dL (ref 30.0–36.0)
MCV: 89.9 fL (ref 78.0–100.0)
Monocytes Absolute: 0.7 10*3/uL (ref 0.1–1.0)
Monocytes Relative: 11.2 % (ref 3.0–12.0)
Neutro Abs: 3.5 10*3/uL (ref 1.4–7.7)
Neutrophils Relative %: 55.8 % (ref 43.0–77.0)
Platelets: 357 10*3/uL (ref 150.0–400.0)
RBC: 5.33 Mil/uL (ref 4.22–5.81)
RDW: 13.1 % (ref 11.5–15.5)
WBC: 6.2 10*3/uL (ref 4.0–10.5)

## 2023-06-19 LAB — BASIC METABOLIC PANEL
BUN: 16 mg/dL (ref 6–23)
CO2: 31 meq/L (ref 19–32)
Calcium: 9 mg/dL (ref 8.4–10.5)
Chloride: 98 meq/L (ref 96–112)
Creatinine, Ser: 1.15 mg/dL (ref 0.40–1.50)
GFR: 60.72 mL/min (ref >60.00)
Glucose, Bld: 108 mg/dL — ABNORMAL HIGH (ref 70–99)
Potassium: 3.9 meq/L (ref 3.5–5.1)
Sodium: 138 meq/L (ref 135–145)

## 2023-06-19 LAB — TSH: TSH: 2.56 u[IU]/mL (ref 0.35–5.50)

## 2023-06-19 NOTE — Assessment & Plan Note (Signed)
Chronic problem.  Currently asymptomatic.  On Levothyroxine daily.  Check labs.  Adjust meds prn

## 2023-06-19 NOTE — Patient Instructions (Signed)
Schedule your complete physical in 6 months We'll notify you of your lab results and make any changes if needed Keep up the good work on healthy diet and regular exercise- you look great! Call [place] and see about your shoulder Call with any questions or concerns Stay Safe!  Stay Healthy! Happy [birth year][employer] Birth[employer]!!

## 2023-06-19 NOTE — Progress Notes (Signed)
   Subjective:    Patient ID: Stephen Morrison, male    DOB: 1946-05-16, 77 y.o.   MRN: [id number]  HPI HTN- chronic problem, on Amlodipine 5mg  daily, Coreg 3.125mg  BID, Hydralazine 10mg  BID, Valsartan hydrochlorothiazide 320/12.5mg  daily.  Pt reports home BP's run 130/80s.  No CP, SOB, HA's, visual changes, edema.  Hyperlipidemia- chronic problem, on Zetia 10mg  daily and Lipitor 40mg  daily.  No abd pain, N/V  Hypothyroid- chronic problem, on Levothyroxine daily.  No change in energy level.  No changes to skin/hair/nails.   Review of Systems For ROS see HPI     Objective:   Physical Exam Vitals reviewed.  Constitutional:      General: He is not in acute distress.    Appearance: Normal appearance. He is well-developed. He is not ill-appearing.  HENT:     Head: Normocephalic and atraumatic.  Eyes:     Extraocular Movements: Extraocular movements intact.     Conjunctiva/sclera: Conjunctivae normal.     Pupils: Pupils are equal, round, and reactive to light.  Neck:     Thyroid: No thyromegaly.  Cardiovascular:     Rate and Rhythm: Normal rate and regular rhythm.     Pulses: Normal pulses.     Heart sounds: Normal heart sounds. No murmur heard. Pulmonary:     Effort: Pulmonary effort is normal. No respiratory distress.     Breath sounds: Normal breath sounds.  Abdominal:     General: Bowel sounds are normal. There is no distension.     Palpations: Abdomen is soft.  Musculoskeletal:     Cervical back: Normal range of motion and neck supple.     Right lower leg: No edema.     Left lower leg: No edema.  Lymphadenopathy:     Cervical: No cervical adenopathy.  Skin:    General: Skin is warm and dry.  Neurological:     General: No focal deficit present.     Mental Status: He is alert and oriented to person, place, and time.     Cranial Nerves: No cranial nerve deficit.  Psychiatric:        Mood and Affect: Mood normal.        Behavior: Behavior normal.            Assessment & Plan:

## 2023-06-19 NOTE — Addendum Note (Signed)
Addended by: Sheliah Hatch on: 06/19/2023 10:24 AM   Modules accepted: Level of Service

## 2023-06-19 NOTE — Assessment & Plan Note (Signed)
Chronic problem.  Currently on Zetia and Lipitor daily w/o difficulty.  Check labs.  Adjust meds prn

## 2023-06-19 NOTE — Assessment & Plan Note (Signed)
Chronic problem.  Pt's BP is mildly elevated today but typically is adequately controlled.  Currently asymptomatic.  Check labs due to ARB and diuretic use but no anticipated med changes.  Will follow.

## 2023-06-20 ENCOUNTER — Telehealth: Payer: Self-pay

## 2023-06-20 NOTE — Telephone Encounter (Signed)
-----   Message from Neena Rhymes sent at 06/20/2023  3:29 PM EST ----- Labs look great!  No changes at this time

## 2023-06-21 ENCOUNTER — Encounter: Payer: Self-pay | Admitting: Family Medicine

## 2023-07-24 NOTE — Progress Notes (Addendum)
Cardiology Office Note Date:  07/24/2023  Patient ID:  Stephen Morrison, Stephen Morrison 1946-05-27, MRN [id number] PCP:  Sheliah Hatch, MD  Electrophysiologist: Dr. Elberta Fortis     Chief Complaint:   4 mo/flecainide visit  History of Present Illness: Stephen Morrison is a 77 y.o. male with history of HTN, HLD, hypothyroidism, AFib  He saw Dr. Elberta Fortis 03/05/22, doing well, happy with his AFib management. Stable intervals, no changes were made  I saw him 03/29/23 He feels well His med list does not look like he is in Eliquis but he assures me he is, shows me his personal list that has eliquis 5mg  BID on it. He is certain he takes it because he used to take Xarelto and had hemorrhoidal bleeding but none with Eliquis since he has been on it. He generally wishes he had more energy, attributes that perhaps mostly to aging, not an acute change No CP, palpitations He says he would know if he had Afib, and since in his meds has not had further, also wonders if his meds contrinute to his fatigue, since 2017 when started feels like his energy was reduced. No near syncope or syncope, no dizzy spels Denies SOB Denies DOE or exertional intolerances Intervals slightly longer then had been Amlodipine reduced 2/2 ankle swelling Planned for 4 mo visit  TODAY He feels very well Denies any CP, palpitations or cardiac awareness Ankle swelling is resolved on lower amlodipine dose BPs at home 130's/60's No SOB, DOE No AFib No bleeding or signs of bleeding    AF/AAD hx Diagnosed 2017 Flecainide started April 2017   Past Medical History:  Diagnosis Date   Allergy    Arthritis    Atrial fibrillation (HCC)    one time incident, on medication now with no further problems   BPH (benign prostatic hyperplasia)    Diverticulosis of colon    GERD (gastroesophageal reflux disease)    Gout    STABLE PER PT ON 05-12-2014   Hyperlipidemia    Hypertension    Hypothyroidism    PSA elevation    Tick bite  02/25/2016   Urinary retention     Past Surgical History:  Procedure Laterality Date   COLONOSCOPY  01-31-2007   EXCISION BENIGN CYST POSTERIOR NECK  2010   INSERTION OF SUPRAPUBIC CATHETER N/A 05/17/2014   Procedure: INSERTION OF SUPRAPUBIC CATHETER;  Surgeon: Valetta Fuller, MD;  Location: [facility];  Service: Urology;  Laterality: N/A;   ORIF RIGHT WRIST FX  2009   TRANSURETHRAL RESECTION OF PROSTATE N/A 05/17/2014   Procedure: TRANSURETHRAL RESECTION OF THE PROSTATE WITH GYRUS INSTRUMENTS;  Surgeon: Valetta Fuller, MD;  Location: [facility];  Service: Urology;  Laterality: N/A;   VASECTOMY      Current Outpatient Medications  Medication Sig Dispense Refill   acetaminophen (TYLENOL) 650 MG CR tablet Take 650 mg by mouth every 8 (eight) hours as needed for pain.     amLODipine (NORVASC) 10 MG tablet Take 0.5 tablets (5 mg total) by mouth daily. 90 tablet 2   apixaban (ELIQUIS) 5 MG TABS tablet Take 1 tablet (5 mg total) by mouth 2 (two) times daily. 180 tablet 2   atorvastatin (LIPITOR) 40 MG tablet TAKE 1 TABLET BY MOUTH DAILY 100 tablet 2   carvedilol (COREG) 3.125 MG tablet TAKE 1 TABLET BY MOUTH TWICE  DAILY 120 tablet 5   cetirizine (ZYRTEC) 10 MG tablet Take 10 mg by mouth  as needed for allergies.     colchicine 0.6 MG tablet Take 1 tablet (0.6 mg total) by mouth 2 (two) times daily as needed (pain). 30 tablet 3   diclofenac Sodium (VOLTAREN) 1 % GEL Apply 4 g topically 4 (four) times daily.     ezetimibe (ZETIA) 10 MG tablet TAKE 1 TABLET BY MOUTH DAILY 100 tablet 2   flecainide (TAMBOCOR) 50 MG tablet TAKE 1 AND 1/2 TABLETS BY MOUTH  TWICE DAILY 300 tablet 3   furosemide (LASIX) 20 MG tablet TAKE 1 TABLET BY MOUTH DAILY 100 tablet 2   gabapentin (NEURONTIN) 300 MG capsule TAKE 1 CAPSULE AT BEDTIME 90 capsule 1   hydrALAZINE (APRESOLINE) 10 MG tablet TAKE 1 TABLET BY MOUTH TWICE  DAILY 200 tablet 2   ibuprofen (ADVIL) 200 MG tablet Take 200 mg by  mouth every 6 (six) hours as needed.     levothyroxine (SYNTHROID) 75 MCG tablet TAKE 1 TABLET BY MOUTH DAILY 100 tablet 2   omeprazole (PRILOSEC) 20 MG capsule Take 20 mg by mouth daily as needed (acid reflux).     triamcinolone cream (KENALOG) 0.1 % SMARTSIG:1 Sparingly Topical Twice Daily     TURMERIC CURCUMIN PO Take 1,000 mg by mouth daily.     valsartan-hydrochlorothiazide (DIOVAN-HCT) 320-12.5 MG tablet TAKE 1 TABLET BY MOUTH DAILY 100 tablet 2   No current facility-administered medications for this visit.    Allergies:   Amoxicillin   Social History:  The patient  reports that he quit smoking about 49 years ago. His smoking use included cigarettes. He started smoking about 59 years ago. He has a 10 pack-year smoking history. He quit smokeless tobacco use about 49 years ago.  His smokeless tobacco use included snuff and chew. He reports current alcohol use. He reports that he does not use drugs.   Family History:  The patient's family history includes Alcohol abuse in his father; Dementia in his father; Esophageal varices in his father; Hyperlipidemia in his mother; Hypertension in his mother.  ROS:  Please see the history of present illness.    All other systems are reviewed and otherwise negative.   PHYSICAL EXAM:  VS:  There were no vitals taken for this visit. BMI: There is no height or weight on file to calculate BMI. Well nourished, well developed, in no acute distress HEENT: normocephalic, atraumatic Neck: no JVD, carotid bruits or masses Cardiac:   RRR; no significant murmurs, no rubs, or gallops Lungs: CTA b/l, no wheezing, rhonchi or rales Abd: soft, nontender MS: no deformity or atrophy Ext:  no  edema   Skin: warm and dry, no rash Neuro:  No gross deficits appreciated Psych: euthymic mood, full affect    EKG:  Done today and reviewed by myself shows  SB 52bpm, PR , QRS 98ms, QTc  03/29/23: SB 56bpm, 1st degree AVblock , QRS , QTc   TTE 12/09/15 - Left ventricle: The cavity size was normal. Wall thickness was   increased in a pattern of mild LVH. Systolic function was normal.   The estimated ejection fraction was in the range of 60% to 65%.   Wall motion was normal; there were no regional wall motion   abnormalities.  Recent Labs: 06/19/2023: ALT 33; BUN 16; Creatinine, Ser 1.15; Hemoglobin 16.0; Platelets 357.0; Potassium 3.9; Sodium 138; TSH 2.56  06/19/2023: Cholesterol 106; HDL 35.40; LDL Cholesterol 47; Total CHOL/HDL Ratio 3; Triglycerides 117.0; VLDL 23.4   CrCl cannot be calculated (Patient's most  recent lab result is older than the maximum 21 days allowed.).   Wt Readings from Last 3 Encounters:  06/19/23 215 lb 8 oz (97.8 kg)  03/29/23 221 lb (100.2 kg)  12/13/22 219 lb 6.4 oz (99.5 kg)     Other studies reviewed: Additional studies/records reviewed today include: summarized above  ASSESSMENT AND PLAN:  Paroxysmal AFib CHA2DS2Vasc is 3, on Eliquis, appropriately dosed No burden by symptoms Flecainide and coreg stable intervals  HTN Better at home  Secondary hypercoagulable state      Disposition: he would prefer a 89mo visit[place] convenient for him if possible,  sooner if needed  Current medicines are reviewed at length with the patient today.  The patient did not have any concerns regarding medicines.  Norma Fredrickson, PA-C 07/24/2023 11:43 AM     [facility] [address] [place] [address] [phone] (office)  [phone] (fax)

## 2023-07-29 ENCOUNTER — Ambulatory Visit: Payer: Medicare Other | Attending: Physician Assistant | Admitting: Physician Assistant

## 2023-07-29 VITALS — BP 143/60 | HR 57 | Ht 70.0 in | Wt 219.0 lb

## 2023-07-29 DIAGNOSIS — I480 Paroxysmal atrial fibrillation: Secondary | ICD-10-CM

## 2023-07-29 DIAGNOSIS — Z5181 Encounter for therapeutic drug level monitoring: Secondary | ICD-10-CM

## 2023-07-29 DIAGNOSIS — D6869 Other thrombophilia: Secondary | ICD-10-CM | POA: Diagnosis not present

## 2023-07-29 DIAGNOSIS — Z79899 Other long term (current) drug therapy: Secondary | ICD-10-CM | POA: Diagnosis not present

## 2023-07-29 DIAGNOSIS — I10 Essential (primary) hypertension: Secondary | ICD-10-CM | POA: Diagnosis not present

## 2023-07-29 NOTE — Patient Instructions (Signed)
Medication Instructions:   Your physician recommends that you continue on your current medications as directed. Please refer to the Current Medication list given to you today.   *If you need a refill on your cardiac medications before your next appointment, please call your pharmacy*   Lab Work: NONE ORDERED  TODAY     If you have labs (blood work) drawn today and your tests are completely normal, you will receive your results only by: MyChart Message (if you have MyChart) OR A paper copy in the mail If you have any lab test that is abnormal or we need to change your treatment, we will call you to review the results.   Testing/Procedures:  NONE ORDERED  TODAY      Follow-Up: At [facility], you and your health needs are our priority.  As part of our continuing mission to provide you with exceptional heart care, we have created designated Provider Care Teams.  These Care Teams include your primary Cardiologist (physician) and Advanced Practice Providers (APPs -  Physician Assistants and Nurse Practitioners) who all work together to provide you with the care you need, when you need it.  We recommend signing up for the patient portal called "MyChart".  Sign up information is provided on this After Visit Summary.  MyChart is used to connect with patients for Virtual Visits (Telemedicine).  Patients are able to view lab/test results, encounter notes, upcoming appointments, etc.  Non-urgent messages can be sent to your provider as well.   To learn more about what you can do with MyChart, go to [email or web address].    Your next appointment:    6 month(s)  [place] [facility]   Provider:    Loman Brooklyn, MD    Other Instructions

## 2023-08-13 ENCOUNTER — Other Ambulatory Visit: Payer: Self-pay | Admitting: Family Medicine

## 2023-10-14 ENCOUNTER — Encounter: Payer: Self-pay | Admitting: Family Medicine

## 2023-10-15 ENCOUNTER — Ambulatory Visit (INDEPENDENT_AMBULATORY_CARE_PROVIDER_SITE_OTHER): Admitting: Family Medicine

## 2023-10-15 ENCOUNTER — Encounter: Payer: Self-pay | Admitting: Family Medicine

## 2023-10-15 VITALS — BP 130/70 | HR 60 | Temp 98.7°F | Ht 70.0 in | Wt 216.1 lb

## 2023-10-15 DIAGNOSIS — R0981 Nasal congestion: Secondary | ICD-10-CM | POA: Diagnosis not present

## 2023-10-15 MED ORDER — PREDNISONE 10 MG PO TABS: ORAL_TABLET | ORAL | 0 refills | Status: DC

## 2023-10-15 NOTE — Patient Instructions (Signed)
 Follow up as needed or as scheduled START the Prednisone as directed- 3 pills at the same time x3 days, then 2 pills at the same time x3 days, then 1 pill daily.  Take w/ food  Make sure you are taking a daily Claritin or Zyrtec until your congestion improves Drink plenty of fluids REST! Call with any questions or concerns Hang in there!

## 2023-10-15 NOTE — Progress Notes (Signed)
   Subjective:    Patient ID: Stephen Morrison, male    DOB: 08/25/1945, 78 y.o.   MRN: [id number]  HPI URI- 'i don't feel that bad.  It's just my sinuses'.  Sxs started Friday w/ congestion.  'i get this every so often'.  No fever.  Denies HA.  Denies facial pain/pressure- 'not yet'.  No ear pain.  Today started w/ a productive cough.  No known sick contacts.   Review of Systems For ROS see HPI     Objective:   Physical Exam Vitals reviewed.  Constitutional:      General: He is not in acute distress.    Appearance: Normal appearance. He is well-developed.  HENT:     Head: Normocephalic and atraumatic.     Nose: Congestion present. No rhinorrhea.     Comments: No TTP over frontal or maxillary sinuses Eyes:     Conjunctiva/sclera: Conjunctivae normal.     Pupils: Pupils are equal, round, and reactive to light.  Cardiovascular:     Rate and Rhythm: Normal rate and regular rhythm.     Heart sounds: Normal heart sounds.  Pulmonary:     Effort: Pulmonary effort is normal. No respiratory distress.     Breath sounds: Normal breath sounds. No wheezing.  Musculoskeletal:     Cervical back: Normal range of motion and neck supple.  Lymphadenopathy:     Cervical: No cervical adenopathy.  Skin:    General: Skin is warm and dry.  Neurological:     General: No focal deficit present.     Mental Status: He is alert and oriented to person, place, and time.  Psychiatric:        Mood and Affect: Mood normal.        Behavior: Behavior normal.           Assessment & Plan:  Nasal congestion- new.  No evidence of bacterial infxn on PE or based on sxs.  No need for abx.  Encouraged daily antihistamine.  Will start low dose prednisone taper to improve congestion.  Reviewed supportive care and red flags that should prompt return.  Pt expressed understanding and is in agreement w/ plan.

## 2023-10-18 ENCOUNTER — Other Ambulatory Visit: Payer: Self-pay

## 2023-10-18 ENCOUNTER — Encounter (HOSPITAL_COMMUNITY): Payer: Self-pay

## 2023-10-18 ENCOUNTER — Emergency Department (HOSPITAL_COMMUNITY)
Admission: EM | Admit: 2023-10-18 | Discharge: 2023-10-18 | Disposition: A | Discharge status: Home / Self Care | Attending: Emergency Medicine | Admitting: Emergency Medicine

## 2023-10-18 DIAGNOSIS — I4949 Other premature depolarization: Secondary | ICD-10-CM | POA: Diagnosis not present

## 2023-10-18 DIAGNOSIS — I4891 Unspecified atrial fibrillation: Secondary | ICD-10-CM | POA: Diagnosis not present

## 2023-10-18 DIAGNOSIS — E876 Hypokalemia: Secondary | ICD-10-CM | POA: Diagnosis not present

## 2023-10-18 DIAGNOSIS — Z7901 Long term (current) use of anticoagulants: Secondary | ICD-10-CM | POA: Diagnosis not present

## 2023-10-18 DIAGNOSIS — R002 Palpitations: Secondary | ICD-10-CM | POA: Diagnosis present

## 2023-10-18 LAB — CBC WITH DIFFERENTIAL/PLATELET
Abs Immature Granulocytes: 0.06 10*3/uL (ref 0.00–0.07)
Basophils Absolute: 0 10*3/uL (ref 0.0–0.1)
Basophils Relative: 0 %
Eosinophils Absolute: 0 10*3/uL (ref 0.0–0.5)
Eosinophils Relative: 0 %
HCT: 50.1 % (ref 39.0–52.0)
Hemoglobin: 16.8 g/dL (ref 13.0–17.0)
Immature Granulocytes: 1 %
Lymphocytes Relative: 15 %
Lymphs Abs: 1.4 10*3/uL (ref 0.7–4.0)
MCH: 29.5 pg (ref 26.0–34.0)
MCHC: 33.5 g/dL (ref 30.0–36.0)
MCV: 88 fL (ref 80.0–100.0)
Monocytes Absolute: 0.4 10*3/uL (ref 0.1–1.0)
Monocytes Relative: 4 %
Neutro Abs: 7.6 10*3/uL (ref 1.7–7.7)
Neutrophils Relative %: 80 %
Platelets: 408 10*3/uL — ABNORMAL HIGH (ref 150–400)
RBC: 5.69 MIL/uL (ref 4.22–5.81)
RDW: 13 % (ref 11.5–15.5)
WBC: 9.5 10*3/uL (ref 4.0–10.5)
nRBC: 0 % (ref 0.0–0.2)

## 2023-10-18 LAB — TROPONIN I (HIGH SENSITIVITY)
Troponin I (High Sensitivity): 7 ng/L (ref <18)
Troponin I (High Sensitivity): 8 ng/L (ref <18)

## 2023-10-18 LAB — COMPREHENSIVE METABOLIC PANEL
ALT: 36 U/L (ref 0–44)
AST: 32 U/L (ref 15–41)
Albumin: 4 g/dL (ref 3.5–5.0)
Alkaline Phosphatase: 54 U/L (ref 38–126)
Anion gap: 12 (ref 5–15)
BUN: 19 mg/dL (ref 8–23)
CO2: 25 mmol/L (ref 22–32)
Calcium: 9.3 mg/dL (ref 8.9–10.3)
Chloride: 102 mmol/L (ref 98–111)
Creatinine, Ser: 0.97 mg/dL (ref 0.61–1.24)
GFR, Estimated: >60 mL/min (ref >60)
Glucose, Bld: 125 mg/dL — ABNORMAL HIGH (ref 70–99)
Potassium: 2.9 mmol/L — ABNORMAL LOW (ref 3.5–5.1)
Sodium: 139 mmol/L (ref 135–145)
Total Bilirubin: 1 mg/dL (ref 0.0–1.2)
Total Protein: 7.7 g/dL (ref 6.5–8.1)

## 2023-10-18 LAB — MAGNESIUM: Magnesium: 2.3 mg/dL (ref 1.7–2.4)

## 2023-10-18 MED ORDER — POTASSIUM CHLORIDE CRYS ER 20 MEQ PO TBCR
40.0000 meq | EXTENDED_RELEASE_TABLET | Freq: Once | ORAL | Status: AC
  Administered 2023-10-18: 40 meq via ORAL
  Filled 2023-10-18: qty 2

## 2023-10-18 MED ORDER — POTASSIUM CHLORIDE 10 MEQ/100ML IV SOLN
10.0000 meq | Freq: Once | INTRAVENOUS | Status: AC
  Administered 2023-10-18: 10 meq via INTRAVENOUS
  Filled 2023-10-18: qty 100

## 2023-10-18 MED ORDER — POTASSIUM CHLORIDE CRYS ER 20 MEQ PO TBCR
20.0000 meq | EXTENDED_RELEASE_TABLET | Freq: Once | ORAL | 0 refills | Status: DC
Start: 2023-10-18 — End: 2023-10-31

## 2023-10-18 NOTE — ED Provider Notes (Signed)
 [place] EMERGENCY DEPARTMENT AT [facility] Provider Note   CSN: [id number] Arrival date & time: 10/18/23  1604     History  Chief Complaint  Patient presents with   Atrial Fibrillation    Stephen Morrison is a 78 y.o. male.  HPI   78 year old male presents to the emergency department with concern for change in heart rate, palpitations.  Patient has history of atrial fibrillation back in 2017, currently on anticoagulation.  Recently was diagnosed with upper respiratory infection and placed on prednisone, he is on day 3.  He started feeling an irregular heartbeat so he checked his phone app which confirms that it was irregular and he came in for evaluation.  He is otherwise having no chest pain, shortness of breath, lightheadedness, presyncope symptoms.  No swelling of his lower extremities.  He is on a diuretic and endorses decreased p.o. intake secondary to the upper respiratory infection symptoms.  Is otherwise compliant with his medications.  Home Medications Prior to Admission medications   Medication Sig Start Date End Date Taking? Authorizing Provider  acetaminophen (TYLENOL) 650 MG CR tablet Take 650 mg by mouth every 8 (eight) hours as needed for pain.    [provider]  amLODipine (NORVASC) 10 MG tablet Take 0.5 tablets (5 mg total) by mouth daily. 03/29/23   Sheilah Pigeon, PA-C  apixaban (ELIQUIS) 5 MG TABS tablet Take 1 tablet (5 mg total) by mouth 2 (two) times daily. 03/29/23   Sheilah Pigeon, PA-C  atorvastatin (LIPITOR) 40 MG tablet TAKE 1 TABLET BY MOUTH DAILY 04/18/23   Sheliah Hatch, MD  carvedilol (COREG) 3.125 MG tablet TAKE 1 TABLET BY MOUTH TWICE  DAILY 04/14/23   Sheliah Hatch, MD  cetirizine (ZYRTEC) 10 MG tablet Take 10 mg by mouth as needed for allergies.    [provider]  colchicine 0.6 MG tablet Take 1 tablet (0.6 mg total) by mouth 2 (two) times daily as needed (pain). 06/07/23   Sheliah Hatch, MD  diclofenac  Sodium (VOLTAREN) 1 % GEL Apply 4 g topically 4 (four) times daily.    [provider]  ezetimibe (ZETIA) 10 MG tablet TAKE 1 TABLET BY MOUTH DAILY 01/14/23   Sheliah Hatch, MD  flecainide (TAMBOCOR) 50 MG tablet TAKE 1 AND 1/2 TABLETS BY MOUTH  TWICE DAILY 04/18/23   Camnitz, Andree Coss, MD  furosemide (LASIX) 20 MG tablet TAKE 1 TABLET BY MOUTH DAILY 03/22/23   Sheliah Hatch, MD  gabapentin (NEURONTIN) 300 MG capsule Take 300 mg by mouth as needed (nerve pain).    [provider]  hydrALAZINE (APRESOLINE) 10 MG tablet TAKE 1 TABLET BY MOUTH TWICE  DAILY 08/13/23   Sheliah Hatch, MD  ibuprofen (ADVIL) 200 MG tablet Take 200 mg by mouth every 6 (six) hours as needed.    [provider]  levothyroxine (SYNTHROID) 75 MCG tablet TAKE 1 TABLET BY MOUTH DAILY 03/05/23   Sheliah Hatch, MD  omeprazole (PRILOSEC) 20 MG capsule Take 20 mg by mouth daily as needed (acid reflux).    [provider]  predniSONE (DELTASONE) 10 MG tablet 3 tabs x3 days and then 2 tabs x3 days and then 1 tab x3 days.  Take w/ food. 10/15/23   Sheliah Hatch, MD  triamcinolone cream (KENALOG) 0.1 % SMARTSIG:1 Sparingly Topical Twice Daily 01/21/23   [provider]  TURMERIC CURCUMIN PO Take 1,000 mg by mouth daily.  [provider]  valsartan-hydrochlorothiazide (DIOVAN-HCT) 320-12.5 MG tablet TAKE 1 TABLET BY MOUTH DAILY 04/18/23   Sheliah Hatch, MD      Allergies    Amoxicillin    Review of Systems   Review of Systems  Constitutional:  Positive for appetite change and fatigue. Negative for fever.  Respiratory:  Negative for shortness of breath.   Cardiovascular:  Positive for palpitations. Negative for chest pain and leg swelling.       Irregular heartbeat  Gastrointestinal:  Negative for abdominal pain, diarrhea and vomiting.  Skin:  Negative for rash.  Neurological:  Negative for headaches.    Physical Exam Updated Vital Signs BP (!)  170/100   Pulse 62   Temp (!) 97.4 F (36.3 C) (Oral)   Resp 20   SpO2 95%  Physical Exam Vitals and nursing note reviewed.  Constitutional:      General: He is not in acute distress.    Appearance: Normal appearance. He is not ill-appearing.  HENT:     Head: Normocephalic.     Mouth/Throat:     Mouth: Mucous membranes are moist.  Cardiovascular:     Rate and Rhythm: Normal rate.  Pulmonary:     Effort: Pulmonary effort is normal. No respiratory distress.     Breath sounds: No rales.  Abdominal:     Palpations: Abdomen is soft.     Tenderness: There is no abdominal tenderness.  Musculoskeletal:        General: No swelling.  Skin:    General: Skin is warm.  Neurological:     Mental Status: He is alert and oriented to person, place, and time. Mental status is at baseline.  Psychiatric:        Mood and Affect: Mood normal.     ED Results / Procedures / Treatments   Labs (all labs ordered are listed, but only abnormal results are displayed) Labs Reviewed  CBC WITH DIFFERENTIAL/PLATELET - Abnormal; Notable for the following components:      Result Value   Platelets 408 (*)    All other components within normal limits  COMPREHENSIVE METABOLIC PANEL - Abnormal; Notable for the following components:   Potassium 2.9 (*)    Glucose, Bld 125 (*)    All other components within normal limits  MAGNESIUM  TROPONIN I (HIGH SENSITIVITY)  TROPONIN I (HIGH SENSITIVITY)    EKG EKG Interpretation Date/Time:  Friday October 18 2023 18:42:42 EST Ventricular Rate:  78 PR Interval:  81 QRS Duration:  97 QT Interval:  386 QTC Calculation: 440 R Axis:   -8  Text Interpretation: Sinus rhythm Ventricular premature complex Short PR interval Left ventricular hypertrophy Confirmed by Coralee Pesa [phone]) on 10/18/2023 6:55:29 PM  Radiology No results found.  Procedures .Critical Care  Performed by: Rozelle Logan, DO Authorized by: Rozelle Logan, DO   Critical care  provider statement:    Critical care time (minutes):  30   Critical care was necessary to treat or prevent imminent or life-threatening deterioration of the following conditions:  Metabolic crisis and endocrine crisis   Critical care was time spent personally by me on the following activities:  Development of treatment plan with patient or surrogate, discussions with consultants, evaluation of patient's response to treatment, examination of patient, ordering and review of laboratory studies, ordering and review of radiographic studies, ordering and performing treatments and interventions, pulse oximetry, re-evaluation of patient's condition and review of old charts   I assumed direction of  critical care for this patient from another provider in my specialty: no     Care discussed with: admitting provider       Medications Ordered in ED Medications  potassium chloride SA (KLOR-CON M) CR tablet 40 mEq (has no administration in time range)  potassium chloride 10 mEq in 100 mL IVPB (has no administration in time range)    ED Course/ Medical Decision Making/ A&P                                 Medical Decision Making Amount and/or Complexity of Data Reviewed Labs: ordered.  Risk Prescription drug management.   78 year old male presents to the emergency departments with concern for palpitations/irregular heartbeat.  Currently he is on day 4 of prednisone for upper respiratory infection.   Patient is mildly hypertensive on arrival but with a normal heart rate.  EKG shows sinus rhythm with premature contractions.  Blood work reveals a hypokalemia of 2.9, normal magnesium.  No ischemic changes on EKG, troponin is negative.  Patient was given IV and p.o. replacement.  Heart rate remains normal sinus with decreased premature contractions.  Review of monitor over the last hour shows no other concerning arrhythmias.  QTc is normal.  Will place the patient on daily potassium for the next couple  days.  Patient will follow-up with his primary doctor next week for repeat blood work and reevaluation of diuretic.  Patient at this time appears safe and stable for discharge and close outpatient follow up. Discharge plan and strict return to ED precautions discussed, patient verbalizes understanding and agreement.        Final Clinical Impression(s) / ED Diagnoses Final diagnoses:  None    Rx / DC Orders ED Discharge Orders     None         Rozelle Logan, DO 10/18/23 2030

## 2023-10-18 NOTE — ED Triage Notes (Signed)
 Pt states that his hr has been all over the place today. Pt reports having a hx of afib 1 time. The only thing that the pt changed is that he started taking Prednisone.

## 2023-10-18 NOTE — ED Notes (Signed)
 Pt ambulated to the bathroom w/out any difficulties.

## 2023-10-18 NOTE — Discharge Instructions (Addendum)
 You have been seen and discharged from the emergency department.  Your potassium was found to be low.  This in combination with the prednisone is most likely the source of your palpitations, change in heart rate.    Your heart rate is in irregular rhythm here.  Outside of the low potassium there are no other acute findings or symptoms of cardiac stress.  You were given oral and IV replacement of the potassium.  Continue to take oral replacement for the next 5 days and plan for repeat blood work with your primary doctor next week to reevaluate potassium level. You are on a diuretic medication that sometimes can cause low potassium.  This medication can be reevaluated by your primary doctor after repeat blood work.  Follow-up with your primary provider for further evaluation and further care. Take home medications as prescribed. If you have any worsening symptoms or further concerns for your health please return to an emergency department for further evaluation.

## 2023-10-20 ENCOUNTER — Encounter: Payer: Self-pay | Admitting: Family Medicine

## 2023-10-23 ENCOUNTER — Other Ambulatory Visit: Payer: Self-pay | Admitting: Family Medicine

## 2023-10-23 ENCOUNTER — Other Ambulatory Visit: Payer: Self-pay | Admitting: Physician Assistant

## 2023-10-23 NOTE — Telephone Encounter (Signed)
 Prescription refill request for Eliquis received. Indication:afib Last office visit:12/24 Scr:0.97  3/25 Age: 78 Weight:98  kg  Prescription refilled

## 2023-10-29 ENCOUNTER — Other Ambulatory Visit: Payer: Self-pay

## 2023-10-29 ENCOUNTER — Emergency Department (HOSPITAL_BASED_OUTPATIENT_CLINIC_OR_DEPARTMENT_OTHER): Admitting: Radiology

## 2023-10-29 ENCOUNTER — Emergency Department (HOSPITAL_BASED_OUTPATIENT_CLINIC_OR_DEPARTMENT_OTHER)
Admission: EM | Admit: 2023-10-29 | Discharge: 2023-10-29 | Disposition: A | Discharge status: Home / Self Care | Attending: Emergency Medicine | Admitting: Emergency Medicine

## 2023-10-29 ENCOUNTER — Emergency Department (HOSPITAL_BASED_OUTPATIENT_CLINIC_OR_DEPARTMENT_OTHER)

## 2023-10-29 DIAGNOSIS — R002 Palpitations: Secondary | ICD-10-CM | POA: Diagnosis not present

## 2023-10-29 DIAGNOSIS — Z7901 Long term (current) use of anticoagulants: Secondary | ICD-10-CM | POA: Diagnosis not present

## 2023-10-29 DIAGNOSIS — E039 Hypothyroidism, unspecified: Secondary | ICD-10-CM | POA: Diagnosis not present

## 2023-10-29 DIAGNOSIS — R059 Cough, unspecified: Secondary | ICD-10-CM | POA: Diagnosis not present

## 2023-10-29 LAB — CBC
HCT: 48.1 % (ref 39.0–52.0)
Hemoglobin: 16.6 g/dL (ref 13.0–17.0)
MCH: 30.2 pg (ref 26.0–34.0)
MCHC: 34.5 g/dL (ref 30.0–36.0)
MCV: 87.6 fL (ref 80.0–100.0)
Platelets: 363 10*3/uL (ref 150–400)
RBC: 5.49 MIL/uL (ref 4.22–5.81)
RDW: 13 % (ref 11.5–15.5)
WBC: 10.4 10*3/uL (ref 4.0–10.5)
nRBC: 0 % (ref 0.0–0.2)

## 2023-10-29 LAB — BASIC METABOLIC PANEL
Anion gap: 9 (ref 5–15)
BUN: 14 mg/dL (ref 8–23)
CO2: 26 mmol/L (ref 22–32)
Calcium: 9 mg/dL (ref 8.9–10.3)
Chloride: 101 mmol/L (ref 98–111)
Creatinine, Ser: 0.99 mg/dL (ref 0.61–1.24)
GFR, Estimated: >60 mL/min (ref >60)
Glucose, Bld: 104 mg/dL — ABNORMAL HIGH (ref 70–99)
Potassium: 3.4 mmol/L — ABNORMAL LOW (ref 3.5–5.1)
Sodium: 136 mmol/L (ref 135–145)

## 2023-10-29 LAB — TSH: TSH: 2.02 u[IU]/mL (ref 0.350–4.500)

## 2023-10-29 MED ORDER — POTASSIUM CHLORIDE CRYS ER 20 MEQ PO TBCR
40.0000 meq | EXTENDED_RELEASE_TABLET | Freq: Once | ORAL | Status: AC
  Administered 2023-10-29: 40 meq via ORAL
  Filled 2023-10-29: qty 2

## 2023-10-29 NOTE — ED Provider Notes (Signed)
 [place] EMERGENCY DEPARTMENT AT [facility] Provider Note   CSN: [id number] Arrival date & time: 10/29/23  1257     History  Chief Complaint  Patient presents with   Tachycardia    Stephen Morrison is a 78 y.o. male.  Patient is a 78 year old male who presents with palpitations.  He has a history of atrial fibrillation.  His last known episode was 2017.  He is on Eliquis.  He said [place] and felt like his heart was beating irregularly.  He felt like it was racing.  He has an app on his phone where he checks his heart rate and he said it was higher than normal.  It was in the 80s and at times the 90s.  It did not go above 100.  He did not have any other symptoms with it.  No chest pain.  No shortness of breath.  No dizziness or lightheadedness.  He was seen here on March 8 for similar symptoms.  At that time he was getting over an upper respiratory infection and was also taking a course of prednisone.  He does have a history of hypothyroidism.  On chart review, his last TSH was checked in November of last year.  He denies any other recent illnesses.  No increased leg swelling.  No fevers.  No cough or cold symptoms.  No vomiting or diarrhea.  On his last visit, he was noted to have a low potassium.  He was given both oral and IV potassium.  He completed a few days of potassium replacement as well.       Home Medications Prior to Admission medications   Medication Sig Start Date End Date Taking? Authorizing Provider  acetaminophen (TYLENOL) 650 MG CR tablet Take 650 mg by mouth every 8 (eight) hours as needed for pain.    [provider]  amLODipine (NORVASC) 10 MG tablet Take 0.5 tablets (5 mg total) by mouth daily. 03/29/23   Sheilah Pigeon, PA-C  atorvastatin (LIPITOR) 40 MG tablet TAKE 1 TABLET BY MOUTH DAILY 04/18/23   Sheliah Hatch, MD  carvedilol (COREG) 3.125 MG tablet TAKE 1 TABLET BY MOUTH TWICE  DAILY 04/14/23   Sheliah Hatch,  MD  cetirizine (ZYRTEC) 10 MG tablet Take 10 mg by mouth as needed for allergies.    [provider]  colchicine 0.6 MG tablet Take 1 tablet (0.6 mg total) by mouth 2 (two) times daily as needed (pain). 06/07/23   Sheliah Hatch, MD  diclofenac Sodium (VOLTAREN) 1 % GEL Apply 4 g topically 4 (four) times daily.    [provider]  ELIQUIS 5 MG TABS tablet TAKE 1 TABLET BY MOUTH TWICE  DAILY 10/23/23   Sheilah Pigeon, PA-C  ezetimibe (ZETIA) 10 MG tablet TAKE 1 TABLET BY MOUTH DAILY 10/23/23   Sheliah Hatch, MD  flecainide (TAMBOCOR) 50 MG tablet TAKE 1 AND 1/2 TABLETS BY MOUTH  TWICE DAILY 04/18/23   Camnitz, Andree Coss, MD  furosemide (LASIX) 20 MG tablet TAKE 1 TABLET BY MOUTH DAILY 03/22/23   Sheliah Hatch, MD  gabapentin (NEURONTIN) 300 MG capsule Take 300 mg by mouth as needed (nerve pain).    [provider]  hydrALAZINE (APRESOLINE) 10 MG tablet TAKE 1 TABLET BY MOUTH TWICE  DAILY 08/13/23   Sheliah Hatch, MD  ibuprofen (ADVIL) 200 MG tablet Take 200 mg by mouth every 6 (six) hours as needed.  [provider]  levothyroxine (SYNTHROID) 75 MCG tablet TAKE 1 TABLET BY MOUTH DAILY 03/05/23   Sheliah Hatch, MD  omeprazole (PRILOSEC) 20 MG capsule Take 20 mg by mouth daily as needed (acid reflux).    [provider]  potassium chloride SA (KLOR-CON M) 20 MEQ tablet Take 1 tablet (20 mEq total) by mouth once for 1 dose. 10/18/23 10/18/23  Horton, Clabe Seal, DO  predniSONE (DELTASONE) 10 MG tablet 3 tabs x3 days and then 2 tabs x3 days and then 1 tab x3 days.  Take w/ food. 10/15/23   Sheliah Hatch, MD  triamcinolone cream (KENALOG) 0.1 % SMARTSIG:1 Sparingly Topical Twice Daily 01/21/23   [provider]  TURMERIC CURCUMIN PO Take 1,000 mg by mouth daily.    [provider]  valsartan-hydrochlorothiazide (DIOVAN-HCT) 320-12.5 MG tablet TAKE 1 TABLET BY MOUTH DAILY 04/18/23   Sheliah Hatch, MD       Allergies    Amoxicillin    Review of Systems   Review of Systems  Constitutional:  Negative for chills, diaphoresis, fatigue and fever.  HENT:  Negative for congestion, rhinorrhea and sneezing.   Eyes: Negative.   Respiratory:  Negative for cough, chest tightness and shortness of breath.   Cardiovascular:  Positive for palpitations. Negative for chest pain and leg swelling.  Gastrointestinal:  Negative for abdominal pain, blood in stool, diarrhea, nausea and vomiting.  Genitourinary:  Negative for difficulty urinating, flank pain, frequency and hematuria.  Musculoskeletal:  Negative for arthralgias and back pain.  Skin:  Negative for rash.  Neurological:  Negative for dizziness, speech difficulty, weakness, numbness and headaches.    Physical Exam Updated Vital Signs BP (!) 157/96   Pulse 63   Temp 98.4 F (36.9 C)   Resp 17   Ht 5\' 10"  (1.778 m)   Wt 98 kg   SpO2 97%   BMI 31.00 kg/m  Physical Exam Constitutional:      Appearance: He is well-developed.  HENT:     Head: Normocephalic and atraumatic.  Eyes:     Pupils: Pupils are equal, round, and reactive to light.  Cardiovascular:     Rate and Rhythm: Normal rate and regular rhythm.     Heart sounds: Normal heart sounds.  Pulmonary:     Effort: Pulmonary effort is normal. No respiratory distress.     Breath sounds: Normal breath sounds. No wheezing or rales.  Chest:     Chest wall: No tenderness.  Abdominal:     General: Bowel sounds are normal.     Palpations: Abdomen is soft.     Tenderness: There is no abdominal tenderness. There is no guarding or rebound.  Musculoskeletal:        General: Normal range of motion.     Cervical back: Normal range of motion and neck supple.     Comments: Edema of his lower extremities bilaterally, at baseline per patient.  Lymphadenopathy:     Cervical: No cervical adenopathy.  Skin:    General: Skin is warm and dry.     Findings: No rash.  Neurological:     Mental  Status: He is alert and oriented to person, place, and time.     ED Results / Procedures / Treatments   Labs (all labs ordered are listed, but only abnormal results are displayed) Labs Reviewed  BASIC METABOLIC PANEL - Abnormal; Notable for the following components:      Result Value   Potassium 3.4 (*)  Glucose, Bld 104 (*)    All other components within normal limits  CBC  TSH    EKG EKG Interpretation Date/Time:  Tuesday October 29 2023 13:11:30 EDT Ventricular Rate:  81 PR Interval:  222 QRS Duration:  98 QT Interval:  380 QTC Calculation: 441 R Axis:   -17  Text Interpretation: Sinus rhythm with 1st degree A-V block Left ventricular hypertrophy with repolarization abnormality ( R in aVL , Cornell product ) Abnormal ECG since last tracing no significant change Confirmed by Rolan Bucco [phone]) on 10/29/2023 5:03:00 PM  Radiology DG Chest 2 View Result Date: 10/29/2023 CLINICAL DATA:  Cough EXAM: CHEST - 2 VIEW COMPARISON:  None Available. FINDINGS: Heart and mediastinal contours are within normal limits. No focal opacities or effusions. No acute bony abnormality. IMPRESSION: No active cardiopulmonary disease. Electronically Signed   By: Charlett Nose M.D.   On: 10/29/2023 17:26    Procedures Procedures    Medications Ordered in ED Medications  potassium chloride SA (KLOR-CON M) CR tablet 40 mEq (has no administration in time range)    ED Course/ Medical Decision Making/ A&P                                 Medical Decision Making Amount and/or Complexity of Data Reviewed Labs: ordered. Radiology: ordered.  Risk Prescription drug management.   Patient is a 78 year old who presents with palpitations.  They lasted about 2 hours.  He was asymptomatic other than the palpitations.  No other associated symptoms.  His labs show mildly low potassium.  However it is much improved since his last visit a week ago.  Will give him 1 more dose of oral potassium.  I did add  on a TSH level and advise him that his primary care doctor or cardiologist can follow-up on this.  He had a chest x-ray two-view which did not show any acute abnormality.  This was interpreted by me and confirmed by the radiologist.  He does not have any clinical concerns for CHF/fluid overload.  No concerns for ACS.  He was discharged home in good condition.  Encouraged him to have close follow-up with his cardiologist.  He may need a Zio patch.  Return precautions were given.  Final Clinical Impression(s) / ED Diagnoses Final diagnoses:  Palpitations    Rx / DC Orders ED Discharge Orders     None         Rolan Bucco, MD 10/29/23 1735

## 2023-10-29 NOTE — Discharge Instructions (Addendum)
 Follow-up with your cardiologist as discussed.  Your primary care doctor or cardiologist can follow-up on the TSH result.  Return to the emergency room if you have any worsening symptoms.

## 2023-10-29 NOTE — ED Triage Notes (Signed)
 States had feelings of "irregular heartbeat" while on the golf course this morning. States was seen at [facility] for the same last Friday. Denies SHOB or CP.

## 2023-10-31 ENCOUNTER — Encounter: Payer: Self-pay | Admitting: Family Medicine

## 2023-10-31 ENCOUNTER — Ambulatory Visit: Admitting: Family Medicine

## 2023-10-31 VITALS — BP 130/64 | HR 86 | Temp 98.6°F | Ht 70.0 in | Wt 218.5 lb

## 2023-10-31 DIAGNOSIS — I480 Paroxysmal atrial fibrillation: Secondary | ICD-10-CM | POA: Diagnosis not present

## 2023-10-31 DIAGNOSIS — E876 Hypokalemia: Secondary | ICD-10-CM

## 2023-10-31 LAB — BASIC METABOLIC PANEL
BUN: 14 mg/dL (ref 6–23)
CO2: 29 meq/L (ref 19–32)
Calcium: 9.5 mg/dL (ref 8.4–10.5)
Chloride: 100 meq/L (ref 96–112)
Creatinine, Ser: 1.04 mg/dL (ref 0.40–1.50)
GFR: 68.33 mL/min (ref >60.00)
Glucose, Bld: 109 mg/dL — ABNORMAL HIGH (ref 70–99)
Potassium: 4.2 meq/L (ref 3.5–5.1)
Sodium: 139 meq/L (ref 135–145)

## 2023-10-31 LAB — MAGNESIUM: Magnesium: 2.4 mg/dL (ref 1.5–2.5)

## 2023-10-31 NOTE — Progress Notes (Signed)
   Subjective:    Patient ID: Stephen Morrison, male    DOB: [birth year], 78 y.o.   MRN: [id number]  HPI ER f/u- pt was back in the ER on 3/18 for palpitations (also seen on 3/7).  He returned b/c he developed irregular heart beat while on the golf course and felt like his heart was racing.  Has hx of PAF.  At the time he had no CP, SOB, dizziness.  His K+ on the 3/7 visit was 2.9, on 3/18 was up to 3.4.  Has appt upcoming w/ Cardiology on 3/26  Already on Amlodipine, Coreg, Flecainide, and Eliquis for PAF.    Today is asymptomatic.  No CP, SOB, edema.   Review of Systems For ROS see HPI     Objective:   Physical Exam Vitals reviewed.  Constitutional:      General: He is not in acute distress.    Appearance: Normal appearance. He is well-developed. He is not ill-appearing.  HENT:     Head: Normocephalic and atraumatic.  Eyes:     Extraocular Movements: Extraocular movements intact.     Conjunctiva/sclera: Conjunctivae normal.     Pupils: Pupils are equal, round, and reactive to light.  Neck:     Thyroid: No thyromegaly.  Cardiovascular:     Rate and Rhythm: Normal rate and regular rhythm.     Pulses: Normal pulses.     Heart sounds: Normal heart sounds. No murmur heard. Pulmonary:     Effort: Pulmonary effort is normal. No respiratory distress.     Breath sounds: Normal breath sounds.  Abdominal:     General: Bowel sounds are normal. There is no distension.     Palpations: Abdomen is soft.  Musculoskeletal:     Cervical back: Normal range of motion and neck supple.     Right lower leg: No edema.     Left lower leg: No edema.  Lymphadenopathy:     Cervical: No cervical adenopathy.  Skin:    General: Skin is warm and dry.  Neurological:     General: No focal deficit present.     Mental Status: He is alert and oriented to person, place, and time.     Cranial Nerves: No cranial nerve deficit.  Psychiatric:        Mood and Affect: Mood normal.        Behavior: Behavior  normal.           Assessment & Plan:

## 2023-10-31 NOTE — Patient Instructions (Addendum)
 Follow up as needed or as scheduled We'll notify you of your lab results and make any changes if needed Keep up the good work!  You look great! Citrus fruits, bananas, leafy greens are all a good source of potassium Call with any questions or concerns Stay Safe!  Stay Healthy! Happy Spring!!!

## 2023-11-01 ENCOUNTER — Encounter: Payer: Self-pay | Admitting: Family Medicine

## 2023-11-03 NOTE — Assessment & Plan Note (Signed)
 Pt reports he had irregular heart beat on 2 recent occasions.  Went to ER on 3/7 and 3/18.  Reports palpitations have resolved and he feels great.  Has f/u w/ cards next week.  Will check BMP and Mag and address any abnormalities if present.

## 2023-11-03 NOTE — Assessment & Plan Note (Signed)
 Pt reports this is the cause of his recent palpitations.  On 3/7 K+ was 2.9.  It had increased to 3.4 on 3/18 visit.  Will recheck K+ and Mg++ today and replete prn.

## 2023-11-05 NOTE — Progress Notes (Unsigned)
  Electrophysiology Office Note:   Date:  11/06/2023  ID:  Stephen Morrison, DOB 02/04/1946, MRN [id number]  Primary Cardiologist: None Primary Heart Failure: None Electrophysiologist: Jaxtin Raimondo Jorja Loa, MD      History of Present Illness:   Stephen Morrison is a 78 y.o. male with h/o hypertension, hyperlipidemia, atrial fibrillation seen today for routine electrophysiology followup.   Since last being seen in our clinic the patient reports doing overall well.  He has no chest pain or shortness of breath.  Over the last month, he has had an increase in palpitations.  He has gone to the emergency room twice.  He had a sinus infection and was started on prednisone.  His first emergency room visit, he was hypokalemic.  He was given potassium in the emergency room.  On his second episode in the emergency room, his potassium was normal.  He was having PVCs on his EKG.  He had another episode of palpitations last night which was short-lived.  Today he feels well.  he denies chest pain, palpitations, dyspnea, PND, orthopnea, nausea, vomiting, dizziness, syncope, edema, weight gain, or early satiety.   Review of systems complete and found to be negative unless listed in HPI.   EP Information / Studies Reviewed:    EKG is not ordered today. EKG from 10/29/2023 reviewed which showed sinus rhythm        Risk Assessment/Calculations:    CHA2DS2-VASc Score = 3   This indicates a 3.2% annual risk of stroke. The patient's score is based upon: CHF History: 0 HTN History: 1 Diabetes History: 0 Stroke History: 0 Vascular Disease History: 0 Age Score: 2 Gender Score: 0            Physical Exam:   VS:  BP (!) 150/86 (BP Location: Left Arm, Patient Position: Sitting, Cuff Size: Large)   Pulse 62   Ht 5\' 10"  (1.778 m)   Wt 215 lb (97.5 kg)   SpO2 96%   BMI 30.85 kg/m    Wt Readings from Last 3 Encounters:  11/06/23 215 lb (97.5 kg)  10/31/23 218 lb 8 oz (99.1 kg)  10/29/23 216 lb 0.8 oz (98 kg)      GEN: Well nourished, well developed in no acute distress NECK: No JVD; No carotid bruits CARDIAC: Regular rate and rhythm, no murmurs, rubs, gallops RESPIRATORY:  Clear to auscultation without rales, wheezing or rhonchi  ABDOMEN: Soft, non-tender, non-distended EXTREMITIES:  No edema; No deformity   ASSESSMENT AND PLAN:    1.  Paroxysmal atrial fibrillation: Currently on flecainide 75 mg twice daily, Coreg 3.125 mg twice daily.  Today he is feeling well.  He has had an increase in palpitations, likely due to PACs and atrial tachycardia.  He was on prednisone and was sick with a sinus infection.  Now that he is off prednisone, he would like to continue monitoring this.  If he does continue to have palpitations, would increase flecainide and carvedilol.  2.  Hypertension: Elevated today.  Usually well-controlled.  No changes.  3.  Secondary hypercoagulable state: Currently on Eliquis for atrial fibrillation  4.  High risk medication monitoring: Currently on flecainide.  QRS remains narrow.  Follow up with Dr. Elberta Fortis in 6 months  Signed, Jedaiah Rathbun Jorja Loa, MD

## 2023-11-06 ENCOUNTER — Ambulatory Visit: Attending: Cardiology | Admitting: Cardiology

## 2023-11-06 ENCOUNTER — Encounter: Payer: Self-pay | Admitting: Cardiology

## 2023-11-06 VITALS — BP 150/86 | HR 62 | Ht 70.0 in | Wt 215.0 lb

## 2023-11-06 DIAGNOSIS — I480 Paroxysmal atrial fibrillation: Secondary | ICD-10-CM

## 2023-11-06 DIAGNOSIS — D6869 Other thrombophilia: Secondary | ICD-10-CM | POA: Diagnosis not present

## 2023-11-06 DIAGNOSIS — I10 Essential (primary) hypertension: Secondary | ICD-10-CM | POA: Diagnosis not present

## 2023-11-06 DIAGNOSIS — Z79899 Other long term (current) drug therapy: Secondary | ICD-10-CM | POA: Diagnosis not present

## 2023-12-16 DIAGNOSIS — M25561 Pain in right knee: Secondary | ICD-10-CM | POA: Diagnosis not present

## 2024-01-16 ENCOUNTER — Other Ambulatory Visit: Payer: Self-pay | Admitting: Family Medicine

## 2024-01-17 DIAGNOSIS — M25561 Pain in right knee: Secondary | ICD-10-CM | POA: Diagnosis not present

## 2024-02-05 DIAGNOSIS — L821 Other seborrheic keratosis: Secondary | ICD-10-CM | POA: Diagnosis not present

## 2024-02-05 DIAGNOSIS — D1801 Hemangioma of skin and subcutaneous tissue: Secondary | ICD-10-CM | POA: Diagnosis not present

## 2024-02-05 DIAGNOSIS — L570 Actinic keratosis: Secondary | ICD-10-CM | POA: Diagnosis not present

## 2024-02-12 ENCOUNTER — Other Ambulatory Visit: Payer: Self-pay | Admitting: Family Medicine

## 2024-02-12 DIAGNOSIS — E038 Other specified hypothyroidism: Secondary | ICD-10-CM

## 2024-03-11 ENCOUNTER — Telehealth: Payer: Self-pay

## 2024-03-11 NOTE — Telephone Encounter (Signed)
 Patient is needing to reschedule AWV

## 2024-03-11 NOTE — Telephone Encounter (Signed)
 Copied from CRM [id number]. Topic: Appointments - Scheduling Inquiry for Clinic >> Mar 11, 2024 12:41 PM Burnard DEL wrote: Reason for RMF:Ejupzwu would  like a phone call from the [facility] to reschedule his AWV that was scheduled for 03/17/2024.

## 2024-03-17 ENCOUNTER — Encounter

## 2024-04-16 ENCOUNTER — Encounter: Payer: Self-pay | Admitting: Cardiology

## 2024-05-05 ENCOUNTER — Ambulatory Visit: Admitting: Pulmonary Disease

## 2024-05-25 ENCOUNTER — Other Ambulatory Visit: Payer: Self-pay | Admitting: Physician Assistant

## 2024-05-25 ENCOUNTER — Other Ambulatory Visit: Payer: Self-pay | Admitting: Family Medicine

## 2024-05-25 DIAGNOSIS — E785 Hyperlipidemia, unspecified: Secondary | ICD-10-CM

## 2024-05-31 NOTE — Progress Notes (Unsigned)
  Electrophysiology Office Note:   Date:  06/01/2024  ID:  Stephen Morrison, DOB July 20, 1946, MRN [id number]  Primary Cardiologist: None Primary Heart Failure: None Electrophysiologist: Prescilla Monger Gladis Norton, MD      History of Present Illness:   Stephen Morrison is a 78 y.o. male with h/o hypertension, hyperlipidemia, atrial fibrillation seen today for routine electrophysiology followup.   Since last being seen in our clinic the patient reports doing well.  He has no chest pain or shortness of breath, though he does have palpitations.  Palpitations started a few months ago.  He went to the beach for a week and palpitations went away.  He can feel his pulse and notes irregularity in his rhythm.  He is unsure about how long it lasts, but does have episodes a few times a week.  He has not had syncope or near syncope.  He does not have fatigue or shortness of breath.  he denies chest pain, palpitations, dyspnea, PND, orthopnea, nausea, vomiting, dizziness, syncope, edema, weight gain, or early satiety.   Review of systems complete and found to be negative unless listed in HPI.   EP Information / Studies Reviewed:    EKG is ordered today. Personal review as below.  EKG Interpretation Date/Time:  Monday June 01 2024 14:45:01 EDT Ventricular Rate:  59 PR Interval:  238 QRS Duration:  102 QT Interval:  484 QTC Calculation: 479 R Axis:   -20  Text Interpretation: Sinus bradycardia with 1st degree A-V block Left ventricular hypertrophy with repolarization abnormality ( R in aVL ) Cannot rule out Septal infarct , age undetermined When compared with ECG of 29-Oct-2023 13:11, Nonspecific T wave abnormality now evident in Inferior leads Confirmed by Avni Traore ([id number]) on 06/01/2024 2:52:37 PM     Risk Assessment/Calculations:    CHA2DS2-VASc Score = 3   This indicates a 3.2% annual risk of stroke. The patient's score is based upon: CHF History: 0 HTN History: 1 Diabetes History: 0 Stroke History:  0 Vascular Disease History: 0 Age Score: 2 Gender Score: 0            Physical Exam:   VS:  BP (!) 162/78   Pulse (!) 59   Ht 5' 10 (1.778 m)   Wt 217 lb (98.4 kg)   SpO2 96%   BMI 31.14 kg/m    Wt Readings from Last 3 Encounters:  06/01/24 217 lb (98.4 kg)  11/06/23 215 lb (97.5 kg)  10/31/23 218 lb 8 oz (99.1 kg)     GEN: Well nourished, well developed in no acute distress NECK: No JVD; No carotid bruits CARDIAC: Regular rate and rhythm, no murmurs, rubs, gallops RESPIRATORY:  Clear to auscultation without rales, wheezing or rhonchi  ABDOMEN: Soft, non-tender, non-distended EXTREMITIES:  No edema; No deformity   ASSESSMENT AND PLAN:    1.  Paroxysmal atrial fibrillation: On flecainide  and carvedilol .  He is having recurrent palpitations.  He had PVCs previously.  He is unsure if this is atrial fibrillation or PVCs.  Jujhar Everett have him wear a 2-week monitor.  2.  Hypertension: Elevated today.  He has whitecoat hypertension.  Usually well-controlled.  No changes.  3.  Secondary hypercoagulable state: On Eliquis   4.  High risk medication monitoring: On flecainide .  QRS remains narrow.  Follow up with Dr. Norton in 12 months  Signed, Charell Faulk Gladis Norton, MD

## 2024-06-01 ENCOUNTER — Encounter: Payer: Self-pay | Admitting: Cardiology

## 2024-06-01 ENCOUNTER — Ambulatory Visit

## 2024-06-01 ENCOUNTER — Ambulatory Visit: Attending: Cardiology | Admitting: Cardiology

## 2024-06-01 VITALS — BP 162/78 | HR 59 | Ht 70.0 in | Wt 217.0 lb

## 2024-06-01 DIAGNOSIS — R002 Palpitations: Secondary | ICD-10-CM

## 2024-06-01 DIAGNOSIS — D6869 Other thrombophilia: Secondary | ICD-10-CM | POA: Diagnosis not present

## 2024-06-01 DIAGNOSIS — I480 Paroxysmal atrial fibrillation: Secondary | ICD-10-CM

## 2024-06-01 DIAGNOSIS — Z79899 Other long term (current) drug therapy: Secondary | ICD-10-CM | POA: Diagnosis not present

## 2024-06-01 DIAGNOSIS — I10 Essential (primary) hypertension: Secondary | ICD-10-CM

## 2024-06-01 NOTE — Patient Instructions (Signed)
 Medication Instructions:  Your physician recommends that you continue on your current medications as directed. Please refer to the Current Medication list given to you today.  *If you need a refill on your cardiac medications before your next appointment, please call your pharmacy*  Lab Work: None ordered  If you have any lab test that is abnormal or we need to change your treatment, we will call you to review the results.  Testing/Procedures:                           Stephen Morrison- Long Term Monitor Instructions  Your physician has requested you wear a ZIO patch monitor for 14 days.  This is a single patch monitor. [employer] supplies one patch monitor per enrollment. Additional stickers are not available. Please do not apply patch if you will be having a Nuclear Stress Test,  Echocardiogram, Cardiac CT, MRI, or Chest Xray during the period you would be wearing the  monitor. The patch cannot be worn during these tests. You cannot remove and re-apply the  ZIO XT patch monitor.  Your ZIO patch monitor will be mailed 3 day USPS to your address on file. It may take 3-5 days  to receive your monitor after you have been enrolled.  Once you have received your monitor, please review the enclosed instructions. Your monitor  has already been registered assigning a specific monitor serial # to you.  Billing and Patient Assistance Program Information  We have supplied [employer] with any of your insurance information on file for billing purposes. [employer] offers a sliding scale Patient Assistance Program for patients that do not have  insurance, or whose insurance does not completely cover the cost of the ZIO monitor.  You must apply for the Patient Assistance Program to qualify for this discounted rate.  To apply, please call [employer] at [phone], select option 4, select option 2, ask to apply for  Patient Assistance Program. Meredeth will ask your household income, and how many people  are in your  household. They will quote your out-of-pocket cost based on that information.  [employer] will also be able to set up a 55-month, interest-free payment plan if needed.  Applying the monitor   Shave hair from upper left chest.  Hold abrader disc by orange tab. Rub abrader in 40 strokes over the upper left chest as  indicated in your monitor instructions.  Clean area with 4 enclosed alcohol pads. Let dry.  Apply patch as indicated in monitor instructions. Patch will be placed under collarbone on left  side of chest with arrow pointing upward.  Rub patch adhesive wings for 2 minutes. Remove white label marked 1. Remove the white  label marked 2. Rub patch adhesive wings for 2 additional minutes.  While looking in a mirror, press and release button in center of patch. A small green light will  flash 3-4 times. This will be your only indicator that the monitor has been turned on.  Do not shower for the first 24 hours. You may shower after the first 24 hours.  Press the button if you feel a symptom. You will hear a small click. Record Date, Time and  Symptom in the Patient Logbook.  When you are ready to remove the patch, follow instructions on the last 2 pages of Patient  Logbook. Stick patch monitor onto the last page of Patient Logbook.  Place Patient Logbook in the blue and white box. Use locking tab  on box and tape box closed  securely. The blue and white box has prepaid postage on it. Please place it in the mailbox as  soon as possible. Your physician should have your test results approximately 7 days after the  monitor has been mailed back to [facility].  Call [facility] Customer Care at [phone] if you have questions regarding  your ZIO XT patch monitor. Call them immediately if you see an orange light blinking on your  monitor.  If your monitor falls off in less than 4 days, contact our Monitor department at [phone].  If your monitor becomes loose or falls off after  4 days call [employer] at [phone] for  suggestions on securing your monitor    Follow-Up: At [facility], you and your health needs are our priority.  As part of our continuing mission to provide you with exceptional heart care, our providers are all part of one team.  This team includes your primary Cardiologist (physician) and Advanced Practice Providers or APPs (Physician Assistants and Nurse Practitioners) who all work together to provide you with the care you need, when you need it.  Your next appointment:   1 year(s)  Provider:   Soyla Norton, MD    Thank you for choosing [facility]!!   Maeola Domino, RN [phone]

## 2024-06-01 NOTE — Progress Notes (Unsigned)
 Enrolled for [employer] to mail a ZIO XT long term holter monitor to the patients address on file.

## 2024-06-23 ENCOUNTER — Other Ambulatory Visit: Payer: Self-pay | Admitting: Cardiology

## 2024-06-24 ENCOUNTER — Ambulatory Visit (INDEPENDENT_AMBULATORY_CARE_PROVIDER_SITE_OTHER): Admitting: *Deleted

## 2024-06-24 ENCOUNTER — Encounter: Payer: Self-pay | Admitting: Family Medicine

## 2024-06-24 ENCOUNTER — Ambulatory Visit: Payer: Self-pay | Admitting: Family Medicine

## 2024-06-24 ENCOUNTER — Ambulatory Visit (INDEPENDENT_AMBULATORY_CARE_PROVIDER_SITE_OTHER): Admitting: Family Medicine

## 2024-06-24 VITALS — BP 140/64 | HR 60 | Wt 217.0 lb

## 2024-06-24 VITALS — Ht 70.0 in | Wt 217.0 lb

## 2024-06-24 DIAGNOSIS — Z0000 Encounter for general adult medical examination without abnormal findings: Secondary | ICD-10-CM | POA: Diagnosis not present

## 2024-06-24 DIAGNOSIS — R232 Flushing: Secondary | ICD-10-CM | POA: Diagnosis not present

## 2024-06-24 LAB — BASIC METABOLIC PANEL WITH GFR
BUN: 15 mg/dL (ref 6–23)
CO2: 31 meq/L (ref 19–32)
Calcium: 9.2 mg/dL (ref 8.4–10.5)
Chloride: 98 meq/L (ref 96–112)
Creatinine, Ser: 1.08 mg/dL (ref 0.40–1.50)
GFR: 65.01 mL/min (ref >60.00)
Glucose, Bld: 94 mg/dL (ref 70–99)
Potassium: 3.9 meq/L (ref 3.5–5.1)
Sodium: 138 meq/L (ref 135–145)

## 2024-06-24 LAB — HEPATIC FUNCTION PANEL
ALT: 32 U/L (ref 0–53)
AST: 25 U/L (ref 0–37)
Albumin: 4.3 g/dL (ref 3.5–5.2)
Alkaline Phosphatase: 63 U/L (ref 39–117)
Bilirubin, Direct: 0.3 mg/dL (ref 0.0–0.3)
Total Bilirubin: 1.5 mg/dL — ABNORMAL HIGH (ref 0.2–1.2)
Total Protein: 7.1 g/dL (ref 6.0–8.3)

## 2024-06-24 LAB — CBC WITH DIFFERENTIAL/PLATELET
Basophils Absolute: 0 K/uL (ref 0.0–0.1)
Basophils Relative: 0.6 % (ref 0.0–3.0)
Eosinophils Absolute: 0.1 K/uL (ref 0.0–0.7)
Eosinophils Relative: 1.5 % (ref 0.0–5.0)
HCT: 48.2 % (ref 39.0–52.0)
Hemoglobin: 16.3 g/dL (ref 13.0–17.0)
Lymphocytes Relative: 26.4 % (ref 12.0–46.0)
Lymphs Abs: 1.7 K/uL (ref 0.7–4.0)
MCHC: 33.9 g/dL (ref 30.0–36.0)
MCV: 88 fl (ref 78.0–100.0)
Monocytes Absolute: 0.7 K/uL (ref 0.1–1.0)
Monocytes Relative: 11 % (ref 3.0–12.0)
Neutro Abs: 4 K/uL (ref 1.4–7.7)
Neutrophils Relative %: 60.5 % (ref 43.0–77.0)
Platelets: 370 K/uL (ref 150.0–400.0)
RBC: 5.48 Mil/uL (ref 4.22–5.81)
RDW: 13.6 % (ref 11.5–15.5)
WBC: 6.6 K/uL (ref 4.0–10.5)

## 2024-06-24 LAB — TSH: TSH: 2.45 u[IU]/mL (ref 0.35–5.50)

## 2024-06-24 LAB — TESTOSTERONE: Testosterone: 314.81 ng/dL (ref 300.00–890.00)

## 2024-06-24 NOTE — Progress Notes (Signed)
   Subjective:    Patient ID: Stephen Morrison, male    DOB: 1945/11/27, 78 y.o.   MRN: [id number]  HPI Hot flashes- pt reports 'I get cold, then I get hot'.  Pt reports it will occur at night while sleeping, after a meal.  Has sensation that he is going to break a sweat, but never does.  No fever.  Sxs started ~1 month ago.  No medication changes at that time.  Pt reports he has seen Cards for irregular heart beat recently- waiting on monitor results.   Review of Systems For ROS see HPI     Objective:   Physical Exam Vitals reviewed.  Constitutional:      General: He is not in acute distress.    Appearance: Normal appearance. He is well-developed. He is not ill-appearing.  HENT:     Head: Normocephalic and atraumatic.  Eyes:     Extraocular Movements: Extraocular movements intact.     Conjunctiva/sclera: Conjunctivae normal.     Pupils: Pupils are equal, round, and reactive to light.  Neck:     Thyroid : No thyromegaly.  Cardiovascular:     Rate and Rhythm: Normal rate. Rhythm irregular.     Pulses: Normal pulses.     Heart sounds: Normal heart sounds. No murmur heard. Pulmonary:     Effort: Pulmonary effort is normal. No respiratory distress.     Breath sounds: Normal breath sounds.  Abdominal:     General: Bowel sounds are normal. There is no distension.     Palpations: Abdomen is soft.  Musculoskeletal:     Cervical back: Normal range of motion and neck supple.     Right lower leg: No edema.     Left lower leg: No edema.  Lymphadenopathy:     Cervical: No cervical adenopathy.  Skin:    General: Skin is warm and dry.  Neurological:     General: No focal deficit present.     Mental Status: He is alert and oriented to person, place, and time.     Cranial Nerves: No cranial nerve deficit.  Psychiatric:        Mood and Affect: Mood normal.        Behavior: Behavior normal.           Assessment & Plan:  Hot flashes- new.  I suspect this is related to his  Afib/palpitations but will check labs to ensure no underlying metabolic cause.  Encouraged low carb diet as this can improve sxs.  Will follow.

## 2024-06-24 NOTE — Patient Instructions (Signed)
 Follow up as needed or as scheduled We'll notify you of your lab results and make any changes if needed I suspect your temperature issues are due to the palpitations Call with any questions or concerns Hang in there!!!

## 2024-06-24 NOTE — Patient Instructions (Signed)
 Stephen Morrison , Thank you for taking time to come for your Medicare Wellness Visit. I appreciate your ongoing commitment to your health goals. Please review the following plan we discussed and let me know if I can assist you in the future.   Screening recommendations/referrals: Colonoscopy:  Recommended yearly ophthalmology/optometry visit for glaucoma screening and checkup Recommended yearly dental visit for hygiene and checkup  Vaccinations: Influenza vaccine:  Pneumococcal vaccine:  Tdap vaccine:  Shingles vaccine:      Preventive Care 65 Years and Older, Male Preventive care refers to lifestyle choices and visits with your health care provider that can promote health and wellness. What does preventive care include? A yearly physical exam. This is also called an annual well check. Dental exams once or twice a year. Routine eye exams. Ask your health care provider how often you should have your eyes checked. Personal lifestyle choices, including: Daily care of your teeth and gums. Regular physical activity. Eating a healthy diet. Avoiding tobacco and drug use. Limiting alcohol use. Practicing safe sex. Taking low doses of aspirin every day. Taking vitamin and mineral supplements as recommended by your health care provider. What happens during an annual well check? The services and screenings done by your health care provider during your annual well check will depend on your age, overall health, lifestyle risk factors, and family history of disease. Counseling  Your health care provider may ask you questions about your: Alcohol use. Tobacco use. Drug use. Emotional well-being. Home and relationship well-being. Sexual activity. Eating habits. History of falls. Memory and ability to understand (cognition). Work and work astronomer. Screening  You may have the following tests or measurements: Height, weight, and BMI. Blood pressure. Lipid and cholesterol levels. These may be  checked every 5 years, or more frequently if you are over 65 years old. Skin check. Lung cancer screening. You may have this screening every year starting at age 80 if you have a 30-pack-year history of smoking and currently smoke or have quit within the past 15 years. Fecal occult blood test (FOBT) of the stool. You may have this test every year starting at age 80. Flexible sigmoidoscopy or colonoscopy. You may have a sigmoidoscopy every 5 years or a colonoscopy every 10 years starting at age 80. Prostate cancer screening. Recommendations will vary depending on your family history and other risks. Hepatitis C blood test. Hepatitis B blood test. Sexually transmitted disease (STD) testing. Diabetes screening. This is done by checking your blood sugar (glucose) after you have not eaten for a while (fasting). You may have this done every 1-3 years. Abdominal aortic aneurysm (AAA) screening. You may need this if you are a current or former smoker. Osteoporosis. You may be screened starting at age 19 if you are at high risk. Talk with your health care provider about your test results, treatment options, and if necessary, the need for more tests. Vaccines  Your health care provider may recommend certain vaccines, such as: Influenza vaccine. This is recommended every year. Tetanus, diphtheria, and acellular pertussis (Tdap, Td) vaccine. You may need a Td booster every 10 years. Zoster vaccine. You may need this after age 80. Pneumococcal 13-valent conjugate (PCV13) vaccine. One dose is recommended after age 80. Pneumococcal polysaccharide (PPSV23) vaccine. One dose is recommended after age 80. Talk to your health care provider about which screenings and vaccines you need and how often you need them. This information is not intended to replace advice given to you by your health care provider.  Make sure you discuss any questions you have with your health care provider. Document Released: 08/26/2015  Document Revised: 04/18/2016 Document Reviewed: 05/31/2015 Elsevier Interactive Patient Education  2017 [employer].  Fall Prevention in the Home Falls can cause injuries. They can happen to people of all ages. There are many things you can do to make your home safe and to help prevent falls. What can I do on the outside of my home? Regularly fix the edges of walkways and driveways and fix any cracks. Remove anything that might make you trip as you walk through a door, such as a raised step or threshold. Trim any bushes or trees on the path to your home. Use bright outdoor lighting. Clear any walking paths of anything that might make someone trip, such as rocks or tools. Regularly check to see if handrails are loose or broken. Make sure that both sides of any steps have handrails. Any raised decks and porches should have guardrails on the edges. Have any leaves, snow, or ice cleared regularly. Use sand or salt on walking paths during winter. Clean up any spills in your garage right away. This includes oil or grease spills. What can I do in the bathroom? Use night lights. Install grab bars by the toilet and in the tub and shower. Do not use towel bars as grab bars. Use non-skid mats or decals in the tub or shower. If you need to sit down in the shower, use a plastic, non-slip stool. Keep the floor dry. Clean up any water  that spills on the floor as soon as it happens. Remove soap buildup in the tub or shower regularly. Attach bath mats securely with double-sided non-slip rug tape. Do not have throw rugs and other things on the floor that can make you trip. What can I do in the bedroom? Use night lights. Make sure that you have a light by your bed that is easy to reach. Do not use any sheets or blankets that are too big for your bed. They should not hang down onto the floor. Have a firm chair that has side arms. You can use this for support while you get dressed. Do not have throw rugs  and other things on the floor that can make you trip. What can I do in the kitchen? Clean up any spills right away. Avoid walking on wet floors. Keep items that you use a lot in easy-to-reach places. If you need to reach something above you, use a strong step stool that has a grab bar. Keep electrical cords out of the way. Do not use floor polish or wax that makes floors slippery. If you must use wax, use non-skid floor wax. Do not have throw rugs and other things on the floor that can make you trip. What can I do with my stairs? Do not leave any items on the stairs. Make sure that there are handrails on both sides of the stairs and use them. Fix handrails that are broken or loose. Make sure that handrails are as long as the stairways. Check any carpeting to make sure that it is firmly attached to the stairs. Fix any carpet that is loose or worn. Avoid having throw rugs at the top or bottom of the stairs. If you do have throw rugs, attach them to the floor with carpet tape. Make sure that you have a light switch at the top of the stairs and the bottom of the stairs. If you do not have them,  ask someone to add them for you. What else can I do to help prevent falls? Wear shoes that: Do not have high heels. Have rubber bottoms. Are comfortable and fit you well. Are closed at the toe. Do not wear sandals. If you use a stepladder: Make sure that it is fully opened. Do not climb a closed stepladder. Make sure that both sides of the stepladder are locked into place. Ask someone to hold it for you, if possible. Clearly mark and make sure that you can see: Any grab bars or handrails. First and last steps. Where the edge of each step is. Use tools that help you move around (mobility aids) if they are needed. These include: Canes. Walkers. Scooters. Crutches. Turn on the lights when you go into a dark area. Replace any light bulbs as soon as they burn out. Set up your furniture so you have a  clear path. Avoid moving your furniture around. If any of your floors are uneven, fix them. If there are any pets around you, be aware of where they are. Review your medicines with your doctor. Some medicines can make you feel dizzy. This can increase your chance of falling. Ask your doctor what other things that you can do to help prevent falls. This information is not intended to replace advice given to you by your health care provider. Make sure you discuss any questions you have with your health care provider. Document Released: 05/26/2009 Document Revised: 01/05/2016 Document Reviewed: 09/03/2014 Elsevier Interactive Patient Education  2017 [employer].

## 2024-06-24 NOTE — Progress Notes (Signed)
 Chief Complaint  Patient presents with   Medicare Wellness     Subjective:   Stephen Morrison is a 78 y.o. male who presents for a Medicare Annual Wellness Visit.  Allergies (verified) Amoxicillin , Cortisone, and Prednisone    History: Past Medical History:  Diagnosis Date   Allergy    Arthritis    Atrial fibrillation (HCC)    one time incident, on medication now with no further problems   BPH (benign prostatic hyperplasia)    Diverticulosis of colon    GERD (gastroesophageal reflux disease)    Gout    STABLE PER PT ON 05-12-2014   Hyperlipidemia    Hypertension    Hypothyroidism    PSA elevation    Tick bite 02/25/2016   Urinary retention    Past Surgical History:  Procedure Laterality Date   COLONOSCOPY  01-31-2007   EXCISION BENIGN CYST POSTERIOR NECK  2010   INSERTION OF SUPRAPUBIC CATHETER N/A 05/17/2014   Procedure: INSERTION OF SUPRAPUBIC CATHETER;  Surgeon: Alm GORMAN Fragmin, MD;  Location: [facility];  Service: Urology;  Laterality: N/A;   ORIF RIGHT WRIST FX  2009   TRANSURETHRAL RESECTION OF PROSTATE N/A 05/17/2014   Procedure: TRANSURETHRAL RESECTION OF THE PROSTATE WITH GYRUS INSTRUMENTS;  Surgeon: Alm GORMAN Fragmin, MD;  Location: [facility];  Service: Urology;  Laterality: N/A;   VASECTOMY     Family History  Problem Relation Age of Onset   Alcohol abuse Father    Dementia Father    Esophageal varices Father    Hyperlipidemia Mother    Hypertension Mother    Colon cancer Neg Hx    Esophageal cancer Neg Hx    Stomach cancer Neg Hx    Rectal cancer Neg Hx    Social History   Occupational History   Occupation: retired  Tobacco Use   Smoking status: Former    Current packs/day: 0.00    Average packs/day: 1 pack/day for 10.0 years (10.0 ttl pk-yrs)    Types: Cigarettes    Start date: 08/14/1963    Quit date: 08/13/1973    Years since quitting: 50.8    Passive exposure: Past   Smokeless tobacco: Former    Types: Snuff, [place]     Quit date: 05/12/1974  Vaping Use   Vaping status: Never Used  Substance and Sexual Activity   Alcohol use: Yes    Alcohol/week: 0.0 standard drinks of alcohol    Comment: OCCASIONALLY   Drug use: No   Sexual activity: Not Currently   Tobacco Counseling Counseling given: Not Answered  SDOH Screenings   Food Insecurity: No Food Insecurity (06/24/2024)  Housing: Unknown (06/24/2024)  Transportation Needs: No Transportation Needs (06/24/2024)  Utilities: Not At Risk (06/24/2024)  Alcohol Screen: Low Risk  (06/21/2024)  Depression (PHQ2-9): Low Risk  (06/24/2024)  Financial Resource Strain: Low Risk  (06/21/2024)  Physical Activity: Insufficiently Active (06/24/2024)  Social Connections: Moderately Isolated (06/24/2024)  Stress: No Stress Concern Present (06/24/2024)  Tobacco Use: Medium Risk (06/24/2024)  Health Literacy: Adequate Health Literacy (06/24/2024)   Depression Screen    06/24/2024    9:15 AM 06/19/2023    8:38 AM 01/23/2023    9:42 AM 12/13/2022    9:12 AM 03/01/2022    3:34 PM 03/01/2022    3:32 PM 06/02/2021    8:50 AM  PHQ 2/9 Scores  PHQ - 2 Score 0 0 0 0 0 0 0  PHQ- 9 Score 0 1  1  1    0      Data saved with a previous flowsheet row definition     Goals Addressed             This Visit's Progress    Patient Stated   On track    Continue current lifestyles     Patient Stated       Stay healthy     PharmD Care Plan   Not on track    CARE PLAN ENTRY (see longitudinal plan of care for additional care plan information)  Current Barriers:  Chronic Disease Management support, education, and care coordination needs related to Hypertension, Hyperlipidemia, and Atrial Fibrillation   Hypertension BP Readings from Last 3 Encounters:  09/28/19 (!) 166/80  09/16/19 (!) 147/81  09/01/19 (!) 160/82   Pharmacist Clinical Goal(s): Over the next 180 days, patient will work with PharmD and providers to maintain BP goal <130/80 Current regimen:   Amlodipine  10 mg once daily  Hydralazine  10 mg twice daily Valsartan -HCTZ 320/12.5 mg once daily  Interventions: Diet and exercise recommendations Patient self care activities - Over the next 180 days, patient will: Check BP at least once every 1-2 weeks, document, and provide at future appointments Ensure daily salt intake < 1500 mg/day Consider using [employer] pool for exercise  Hyperlipidemia Lab Results  Component Value Date/Time   LDLCALC 42 09/18/2019 12:51 PM   LDLDIRECT 123.3 06/01/2013 12:37 PM   Pharmacist Clinical Goal(s): Over the next 180 days, patient will work with PharmD and providers to maintain LDL goal < 70 Current regimen:  Simvastatin -ezetimibe  40-10 mg once daily Atorvastatin  40 mg once daily Interventions: Diet and exercise recommendations Patient self care activities - Over the next 180 days, patient will: Consider using [employer] pool for exercise  Afib Pharmacist Clinical Goal(s) Over the next 180 days, patient will work with PharmD and providers to review accessibility on anticoagulation regimen Current regimen:  Eliquis  5 mg twice daily Interventions: Review PAP eligibility, help with application process as appropriate Patient self care activities - Over the next 180 days, patient will: If needed, please complete patient section of patient assistance application  Medication management Pharmacist Clinical Goal(s): Over the next 180 days, patient will work with PharmD and providers to maintain optimal medication adherence Current pharmacy: [facility] Interventions Comprehensive medication review performed. Continue current medication management strategy Patient self care activities - Over the next 180 days, patient will: Take medications as prescribed Report any questions or concerns to PharmD and/or provider(s) Initial goal documentation.       Visit info / Clinical Intake: Medicare Wellness Visit Type:: Subsequent Annual Wellness Visit Persons  participating in visit:: patient Medicare Wellness Visit Mode:: Telephone If telephone:: video declined Because this visit was a virtual/telehealth visit:: unable to obtan vitals due to lack of equipment If Telephone or Video please confirm:: I connected with the patient using audio enabled telemedicine application and verified that I am speaking with the correct person using two identifiers; I discussed the limitations of evaluation and management by telemedicine; The patient expressed understanding and agreed to proceed Patient Location:: home Provider Location:: home Information given by:: patient Interpreter Needed?: No Pre-visit prep was completed: no AWV questionnaire completed by patient prior to visit?: yes Date:: 06/21/24 Living arrangements:: lives with spouse/significant other Patient's Overall Health Status Rating: (!) fair Typical amount of pain: none Does pain affect daily life?: no Are you currently prescribed opioids?: no  Dietary Habits and Nutritional Risks How many meals a day?: 3  Eats fruit and vegetables daily?: yes Most meals are obtained by: preparing own meals; eating out Diabetic:: no  Functional Status Activities of Daily Living (to include ambulation/medication): (Patient-Rptd) Independent Ambulation: (Patient-Rptd) Independent Medication Administration: Independent Home Management: (Patient-Rptd) Independent Manage your own finances?: yes Primary transportation is: driving Concerns about vision?: no *vision screening is required for WTM* Concerns about hearing?: no  Fall Screening Falls in the past year?: (Patient-Rptd) 0 Number of falls in past year: (Patient-Rptd) 0 Was there an injury with Fall?: (Patient-Rptd) 0 Fall Risk Category Calculator: (Patient-Rptd) 0 Patient Fall Risk Level: (Patient-Rptd) Low Fall Risk  Fall Risk Patient at Risk for Falls Due to: No Fall Risks Fall risk Follow up: Falls evaluation completed; Education provided;  Falls prevention discussed  Home and Transportation Safety: All rugs have non-skid backing?: N/A, no rugs All stairs or steps have railings?: yes Grab bars in the bathtub or shower?: yes Have non-skid surface in bathtub or shower?: yes Good home lighting?: yes Regular seat belt use?: yes Hospital stays in the last year:: no  Cognitive Assessment Difficulty concentrating, remembering, or making decisions? : no Will 6CIT or Mini Cog be Completed: yes What year is it?: 0 points What month is it?: 0 points Give patient an address phrase to remember (5 components): Its very sunny outside today in November About what time is it?: 0 points Count backwards from 20 to 1: 0 points Say the months of the year in reverse: 0 points Repeat the address phrase from earlier: 0 points 6 CIT Score: 0 points  Advance Directives (For Healthcare) Does Patient Have a Medical Advance Directive?: Yes Type of Advance Directive: Living will Copy of Living Will in Chart?: No - copy requested Would patient like information on creating a medical advance directive?: No - Patient declined  Reviewed/Updated  Reviewed/Updated: Reviewed All (Medical, Surgical, Family, Medications, Allergies, Care Teams, Patient Goals); Surgical History; Family History; Medications; Allergies; Care Teams; Patient Goals; Medical History        Objective:    Today's Vitals   06/24/24 0909  Weight: 217 lb (98.4 kg)  Height: 5' 10 (1.778 m)   Body mass index is 31.14 kg/m.  Current Medications (verified) Outpatient Encounter Medications as of 06/24/2024  Medication Sig   acetaminophen  (TYLENOL ) 650 MG CR tablet Take 650 mg by mouth every 8 (eight) hours as needed for pain.   amLODipine  (NORVASC ) 10 MG tablet TAKE ONE-HALF TABLET BY MOUTH  DAILY   atorvastatin  (LIPITOR) 40 MG tablet TAKE 1 TABLET BY MOUTH DAILY   carvedilol  (COREG ) 3.125 MG tablet TAKE 1 TABLET BY MOUTH TWICE  DAILY   cetirizine (ZYRTEC) 10 MG tablet Take  10 mg by mouth as needed for allergies.   colchicine  0.6 MG tablet Take 1 tablet (0.6 mg total) by mouth 2 (two) times daily as needed (pain).   diclofenac  Sodium (VOLTAREN ) 1 % GEL Apply 4 g topically 4 (four) times daily.   ELIQUIS  5 MG TABS tablet TAKE 1 TABLET BY MOUTH TWICE  DAILY   ezetimibe  (ZETIA ) 10 MG tablet TAKE 1 TABLET BY MOUTH DAILY   flecainide  (TAMBOCOR ) 50 MG tablet TAKE 1 AND 1/2 TABLETS BY MOUTH  TWICE DAILY   furosemide  (LASIX ) 20 MG tablet TAKE 1 TABLET BY MOUTH DAILY   hydrALAZINE  (APRESOLINE ) 10 MG tablet TAKE 1 TABLET BY MOUTH TWICE  DAILY   ibuprofen (ADVIL) 200 MG tablet Take 200 mg by mouth every 6 (six) hours as needed.   levothyroxine  (SYNTHROID ) 75 MCG  tablet TAKE 1 TABLET BY MOUTH DAILY   omeprazole (PRILOSEC) 20 MG capsule Take 20 mg by mouth daily as needed (acid reflux).   TURMERIC CURCUMIN PO Take 1,000 mg by mouth daily.   valsartan -hydrochlorothiazide  (DIOVAN -HCT) 320-12.5 MG tablet TAKE 1 TABLET BY MOUTH DAILY   No facility-administered encounter medications on file as of 06/24/2024.   Hearing/Vision screen Hearing Screening - Comments:: Hearing aids Vision Screening - Comments:: Up to date Unsure of name Immunizations and Health Maintenance Health Maintenance  Topic Date Due   Influenza Vaccine  03/13/2024   COVID-19 Vaccine (5 - 2025-26 season) 07/10/2024 (Originally 04/13/2024)   Medicare Annual Wellness (AWV)  06/24/2025   Colonoscopy  06/14/2027   DTaP/Tdap/Td (3 - Td or Tdap) 10/24/2031   Pneumococcal Vaccine: 50+ Years  Completed   Zoster Vaccines- Shingrix  Completed   Meningococcal B Vaccine  Aged Out   Hepatitis C Screening  Discontinued        Assessment/Plan:  This is a routine wellness examination for [place].  Patient Care Team: Mahlon Comer BRAVO, MD as PCP - General (Family Medicine) Inocencio Soyla Lunger, MD as PCP - Electrophysiology (Cardiology) Alline Lenis, MD (Inactive) as Consulting Physician (Urology) [facility] Pandora Cadet, [facility] as Pharmacist (Pharmacist)  I have personally reviewed and noted the following in the patient's chart:   Medical and social history Use of alcohol, tobacco or illicit drugs  Current medications and supplements including opioid prescriptions. Functional ability and status Nutritional status Physical activity Advanced directives List of other physicians Hospitalizations, surgeries, and ER visits in previous 12 months Vitals Screenings to include cognitive, depression, and falls Referrals and appointments  No orders of the defined types were placed in this encounter.  In addition, I have reviewed and discussed with patient certain preventive protocols, quality metrics, and best practice recommendations. A written personalized care plan for preventive services as well as general preventive health recommendations were provided to patient.   Mliss Graff, LPN   [id number]/[id number]   Return in 1 year (on 06/24/2025).  After Visit Summary: (MyChart) Due to this being a telephonic visit, the after visit summary with patients personalized plan was offered to patient via MyChart   Nurse Notes:

## 2024-06-25 NOTE — Progress Notes (Signed)
Patient has viewed via Mychart 

## 2024-06-26 DIAGNOSIS — I480 Paroxysmal atrial fibrillation: Secondary | ICD-10-CM

## 2024-06-26 DIAGNOSIS — R002 Palpitations: Secondary | ICD-10-CM | POA: Diagnosis not present

## 2024-06-30 ENCOUNTER — Ambulatory Visit: Payer: Self-pay | Admitting: Cardiology

## 2024-07-03 ENCOUNTER — Encounter: Payer: Self-pay | Admitting: Cardiology

## 2024-07-03 MED ORDER — FLECAINIDE ACETATE 100 MG PO TABS: 100.0000 mg | ORAL_TABLET | Freq: Two times a day (BID) | ORAL | 3 refills | Status: AC

## 2024-07-03 NOTE — Telephone Encounter (Signed)
 Informed patient of dose changed, prescription sent to [facility] delivery

## 2024-07-08 NOTE — Telephone Encounter (Signed)
 See above note. MyChart message sent to patient to schedule nurse visit.

## 2024-07-16 ENCOUNTER — Other Ambulatory Visit: Payer: Self-pay | Admitting: Family Medicine

## 2024-07-22 ENCOUNTER — Ambulatory Visit: Attending: Cardiology

## 2024-07-22 VITALS — HR 52 | Ht 70.0 in | Wt 218.0 lb

## 2024-07-22 DIAGNOSIS — I480 Paroxysmal atrial fibrillation: Secondary | ICD-10-CM | POA: Diagnosis not present

## 2024-07-22 NOTE — Patient Instructions (Signed)
 Medication Instructions:  Your physician recommends that you continue on your current medications as directed. Please refer to the Current Medication list given to you today.  *If you need a refill on your cardiac medications before your next appointment, please call your pharmacy*  Lab Work: NONE  If you have labs (blood work) drawn today and your tests are completely normal, you will receive your results only by: MyChart Message (if you have MyChart) OR A paper copy in the mail If you have any lab test that is abnormal or we need to change your treatment, we will call you to review the results.  Testing/Procedures: NONE  Follow-Up:As ordered  At [facility], you and your health needs are our priority.  As part of our continuing mission to provide you with exceptional heart care, our providers are all part of one team.  This team includes your primary Cardiologist (physician) and Advanced Practice Providers or APPs (Physician Assistants and Nurse Practitioners) who all work together to provide you with the care you need, when you need it.

## 2024-07-22 NOTE — Progress Notes (Unsigned)
° °  Nurse Visit   Date of Encounter: 07/22/2024 ID: Stephen Morrison, DOB 28-May-1946, MRN [id number]  PCP:  Mahlon Comer BRAVO, MD   [facility] Providers Cardiologist:  None Electrophysiologist:  Will Gladis Norton, MD { Click to update primary MD,subspecialty MD or APP then REFRESH:1}     Visit Details   VS:  There were no vitals taken for this visit. , BMI There is no height or weight on file to calculate BMI.  Wt Readings from Last 3 Encounters:  06/24/24 217 lb (98.4 kg)  06/24/24 217 lb (98.4 kg)  06/01/24 217 lb (98.4 kg)     Reason for visit: EKG, post Flecainide  increase to 100 mg QD Performed today: Vitals, EKG, Provider consulted:Dr. Lavona, and Education Changes (medications, testing, etc.) : NO changes Length of Visit: 20 minutes    Medications Adjustments/Labs and Tests Ordered: Orders Placed This Encounter  Procedures   EKG 12-Lead   No orders of the defined types were placed in this encounter.    Signed, Stephen LOISE Norrie, RN  07/22/2024 3:01 PM

## 2024-07-24 NOTE — Addendum Note (Signed)
 Addended by: Arnell Mausolf E on: 07/24/2024 09:30 AM   Modules accepted: Orders

## 2024-08-23 ENCOUNTER — Other Ambulatory Visit: Payer: Self-pay | Admitting: Physician Assistant

## 2024-08-23 ENCOUNTER — Other Ambulatory Visit: Payer: Self-pay | Admitting: Family Medicine

## 2025-06-30 ENCOUNTER — Ambulatory Visit
